# Patient Record
Sex: Female | Born: 1950 | Race: White | Hispanic: No | Marital: Married | Smoking: Never smoker
Health system: Southern US, Community
[De-identification: ages and names within clinical notes are randomized; demographics above are authoritative.]

## PROBLEM LIST (undated history)

## (undated) DIAGNOSIS — I447 Left bundle-branch block, unspecified: Secondary | ICD-10-CM

## (undated) DIAGNOSIS — Z8619 Personal history of other infectious and parasitic diseases: Secondary | ICD-10-CM

## (undated) DIAGNOSIS — E785 Hyperlipidemia, unspecified: Secondary | ICD-10-CM

## (undated) DIAGNOSIS — K219 Gastro-esophageal reflux disease without esophagitis: Secondary | ICD-10-CM

## (undated) DIAGNOSIS — Z8489 Family history of other specified conditions: Secondary | ICD-10-CM

## (undated) DIAGNOSIS — G709 Myoneural disorder, unspecified: Secondary | ICD-10-CM

## (undated) DIAGNOSIS — R112 Nausea with vomiting, unspecified: Secondary | ICD-10-CM

## (undated) DIAGNOSIS — Z9889 Other specified postprocedural states: Secondary | ICD-10-CM

## (undated) DIAGNOSIS — M1990 Unspecified osteoarthritis, unspecified site: Secondary | ICD-10-CM

## (undated) DIAGNOSIS — R011 Cardiac murmur, unspecified: Secondary | ICD-10-CM

## (undated) HISTORY — DX: Personal history of other infectious and parasitic diseases: Z86.19

## (undated) HISTORY — DX: Gastro-esophageal reflux disease without esophagitis: K21.9

## (undated) HISTORY — PX: JOINT REPLACEMENT: SHX530

## (undated) HISTORY — DX: Left bundle-branch block, unspecified: I44.7

## (undated) HISTORY — DX: Hyperlipidemia, unspecified: E78.5

## (undated) HISTORY — DX: Myoneural disorder, unspecified: G70.9

## (undated) HISTORY — PX: FRACTURE SURGERY: SHX138

## (undated) HISTORY — DX: Unspecified osteoarthritis, unspecified site: M19.90

## (undated) HISTORY — PX: OTHER SURGICAL HISTORY: SHX169

---

## 1988-01-20 HISTORY — PX: OTHER SURGICAL HISTORY: SHX169

## 2000-09-01 ENCOUNTER — Other Ambulatory Visit: Admission: RE | Admit: 2000-09-01 | Discharge: 2000-09-01 | Payer: Self-pay | Admitting: Obstetrics and Gynecology

## 2002-09-18 ENCOUNTER — Other Ambulatory Visit: Admission: RE | Admit: 2002-09-18 | Discharge: 2002-09-18 | Payer: Self-pay | Admitting: Obstetrics and Gynecology

## 2003-08-07 ENCOUNTER — Ambulatory Visit (HOSPITAL_COMMUNITY): Admission: RE | Admit: 2003-08-07 | Discharge: 2003-08-07 | Payer: Self-pay | Admitting: Cardiology

## 2010-03-14 ENCOUNTER — Ambulatory Visit (INDEPENDENT_AMBULATORY_CARE_PROVIDER_SITE_OTHER): Payer: BC Managed Care – PPO | Admitting: Cardiology

## 2010-03-14 DIAGNOSIS — I447 Left bundle-branch block, unspecified: Secondary | ICD-10-CM

## 2010-03-26 ENCOUNTER — Encounter: Payer: Self-pay | Admitting: Cardiology

## 2010-03-26 ENCOUNTER — Ambulatory Visit (HOSPITAL_COMMUNITY): Payer: BC Managed Care – PPO | Attending: Cardiology

## 2010-03-26 DIAGNOSIS — I059 Rheumatic mitral valve disease, unspecified: Secondary | ICD-10-CM | POA: Insufficient documentation

## 2010-03-26 DIAGNOSIS — I447 Left bundle-branch block, unspecified: Secondary | ICD-10-CM | POA: Insufficient documentation

## 2011-03-05 ENCOUNTER — Encounter: Payer: Self-pay | Admitting: *Deleted

## 2011-03-10 ENCOUNTER — Ambulatory Visit (INDEPENDENT_AMBULATORY_CARE_PROVIDER_SITE_OTHER): Payer: BC Managed Care – PPO | Admitting: Cardiology

## 2011-03-10 DIAGNOSIS — E7800 Pure hypercholesterolemia, unspecified: Secondary | ICD-10-CM

## 2011-03-10 DIAGNOSIS — I447 Left bundle-branch block, unspecified: Secondary | ICD-10-CM

## 2011-03-10 DIAGNOSIS — E785 Hyperlipidemia, unspecified: Secondary | ICD-10-CM

## 2011-03-10 NOTE — Patient Instructions (Addendum)
Dr Shirlee Latch has recommended that you take aspirin 81 mg daily.  Your physician recommends that you return for a FASTING lipid profile /liver profile/TSH/CBC/BMET   Dr Shirlee Latch has recommended you see Dr Felicity Coyer for your primary care.   Your physician wants you to follow-up in: 1 year with Dr Shirlee Latch. (February 2014). You will receive a reminder letter in the mail two months in advance. If you don't receive a letter, please call our office to schedule the follow-up appointment.

## 2011-03-11 ENCOUNTER — Encounter: Payer: Self-pay | Admitting: Cardiology

## 2011-03-11 DIAGNOSIS — I447 Left bundle-branch block, unspecified: Secondary | ICD-10-CM | POA: Insufficient documentation

## 2011-03-11 DIAGNOSIS — E785 Hyperlipidemia, unspecified: Secondary | ICD-10-CM | POA: Insufficient documentation

## 2011-03-11 NOTE — Assessment & Plan Note (Signed)
Chronic LBBB.  Negative myoview about 10 years ago when this was first noted.  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  No symptoms suggesting episodes of higher degree heart block.  Would have her take ASA 81 mg daily given somewhat elevated cardiac risk from the presence of LBBB.

## 2011-03-11 NOTE — Assessment & Plan Note (Signed)
She has not taken atorvastatin regularly.  Will check lipids.

## 2011-03-11 NOTE — Progress Notes (Signed)
61 yo with history of chronic LBBB presents for cardiology followup.  She has been seen by Dr. Deborah Chalk in the past and is seen by me for the first time today.  She had a stress myoview done about 10 years ago that was normal.  Echo in 3/12 showed dyssynchrony but EF was normal.  She has been doing well symptomatically.  No exertional dyspnea though she does not get a lot of regular exercise.  No chest pain.  No history of lightheadedness or syncope.  She does not have a PCP and has not had lipids, etc, checked recently.    ECG: NSR, LBBB  PMH: 1. Chronic LBBB: Had myoview around 2003 that was normal.  Echo (3/12) with septal-lateral dyssynchrony, EF 55%, mild MR.  2. Hyperlpidemia 3. H/o scarlet fever at age 8.  4. Syncope with blood draws.  SH:  Married, Technical sales engineer, nonsmoker, lives in [place].   FH:  Grandmother with MI.   ROS: All systems reviewed and negative except as per HPI.   Current Outpatient Prescriptions  Medication Sig Dispense Refill  . atorvastatin (LIPITOR) 10 MG tablet Take 10 mg by mouth Daily.      Marland Kitchen aspirin (ASPIRIN CHILDRENS) 81 MG chewable tablet Take one tablet daily        BP 132/86  Pulse 56  Ht 5\' 5"  (1.651 m)  Wt 136 lb 12.8 oz (62.052 kg)  BMI 22.76 kg/m2 General: NAD Neck: No JVD, no thyromegaly or thyroid nodule.  Lungs: Clear to auscultation bilaterally with normal respiratory effort. CV: Nondisplaced PMI.  Heart regular S1/S2, S2 is paradoxically split, no S3/S4, no murmur.  No peripheral edema.  No carotid bruit.  Normal pedal pulses.  Abdomen: Soft, nontender, no hepatosplenomegaly, no distention.  Neurologic: Alert and oriented x 3.  Psych: Normal affect. Extremities: No clubbing or cyanosis.

## 2011-03-12 ENCOUNTER — Other Ambulatory Visit: Payer: BC Managed Care – PPO

## 2011-03-26 ENCOUNTER — Other Ambulatory Visit (INDEPENDENT_AMBULATORY_CARE_PROVIDER_SITE_OTHER): Payer: BC Managed Care – PPO

## 2011-03-26 DIAGNOSIS — I447 Left bundle-branch block, unspecified: Secondary | ICD-10-CM

## 2011-03-26 DIAGNOSIS — E7800 Pure hypercholesterolemia, unspecified: Secondary | ICD-10-CM

## 2011-03-26 LAB — CBC WITH DIFFERENTIAL/PLATELET
Basophils Absolute: 0 10*3/uL (ref 0.0–0.1)
Eosinophils Relative: 1.5 % (ref 0.0–5.0)
HCT: 42.3 % (ref 36.0–46.0)
Hemoglobin: 14.3 g/dL (ref 12.0–15.0)
Lymphs Abs: 2.1 10*3/uL (ref 0.7–4.0)
MCV: 93.1 fl (ref 78.0–100.0)
Monocytes Absolute: 0.5 10*3/uL (ref 0.1–1.0)
Monocytes Relative: 8.8 % (ref 3.0–12.0)
Neutro Abs: 3.1 10*3/uL (ref 1.4–7.7)
Platelets: 227 10*3/uL (ref 150.0–400.0)
RDW: 12.8 % (ref 11.5–14.6)

## 2011-03-26 LAB — HEPATIC FUNCTION PANEL
AST: 24 U/L (ref 0–37)
Albumin: 4.7 g/dL (ref 3.5–5.2)
Alkaline Phosphatase: 70 U/L (ref 39–117)
Total Bilirubin: 0.7 mg/dL (ref 0.3–1.2)

## 2011-03-26 LAB — TSH: TSH: 1.48 u[IU]/mL (ref 0.35–5.50)

## 2011-03-26 LAB — BASIC METABOLIC PANEL
Calcium: 9.6 mg/dL (ref 8.4–10.5)
GFR: 75.32 mL/min (ref >60.00)
Potassium: 4.3 mEq/L (ref 3.5–5.1)
Sodium: 141 mEq/L (ref 135–145)

## 2011-03-26 LAB — LIPID PANEL
HDL: 53.1 mg/dL (ref >39.00)
Triglycerides: 253 mg/dL — ABNORMAL HIGH (ref 0.0–149.0)

## 2011-03-27 ENCOUNTER — Other Ambulatory Visit: Payer: Self-pay | Admitting: Cardiology

## 2011-03-30 ENCOUNTER — Telehealth: Payer: Self-pay | Admitting: Cardiology

## 2011-03-30 ENCOUNTER — Other Ambulatory Visit: Payer: Self-pay

## 2011-03-30 MED ORDER — ATORVASTATIN CALCIUM 10 MG PO TABS: 10.0000 mg | ORAL_TABLET | Freq: Every day | ORAL | Status: DC

## 2011-03-30 NOTE — Telephone Encounter (Signed)
Out of pills 

## 2011-04-06 NOTE — Telephone Encounter (Signed)
She was notified that her lipitor was refilled on 03/30/11 at [facility] on [address].  She was given lab results and states she has been taking her lipitor daily and watching her diet closely.

## 2011-04-06 NOTE — Telephone Encounter (Signed)
Pt rtn call from last week and she needs her refill for lipitor and she also would like her results of her labs she got done prior to her going out of town.

## 2011-04-13 ENCOUNTER — Other Ambulatory Visit: Payer: Self-pay | Admitting: Cardiology

## 2011-04-13 MED ORDER — ATORVASTATIN CALCIUM 40 MG PO TABS: 40.0000 mg | ORAL_TABLET | Freq: Every day | ORAL | Status: DC

## 2011-04-13 NOTE — Telephone Encounter (Signed)
New Msg: Pt needs refill of Lipitor 40 mg called into [facility] on [address]. Pt RX dosage was increased from 10 mg to 40mg . Therefore pt needs new RX called in.

## 2011-04-13 NOTE — Telephone Encounter (Signed)
Refilled lipitor 40mg  to pharmacy

## 2011-07-29 ENCOUNTER — Ambulatory Visit (INDEPENDENT_AMBULATORY_CARE_PROVIDER_SITE_OTHER): Payer: BC Managed Care – PPO | Admitting: Internal Medicine

## 2011-07-29 ENCOUNTER — Encounter: Payer: Self-pay | Admitting: Internal Medicine

## 2011-07-29 VITALS — BP 132/82 | HR 69 | Temp 97.4°F | Ht 65.0 in | Wt 132.4 lb

## 2011-07-29 DIAGNOSIS — Z1239 Encounter for other screening for malignant neoplasm of breast: Secondary | ICD-10-CM

## 2011-07-29 DIAGNOSIS — Z23 Encounter for immunization: Secondary | ICD-10-CM

## 2011-07-29 DIAGNOSIS — Z1211 Encounter for screening for malignant neoplasm of colon: Secondary | ICD-10-CM

## 2011-07-29 DIAGNOSIS — E785 Hyperlipidemia, unspecified: Secondary | ICD-10-CM

## 2011-07-29 DIAGNOSIS — Z780 Asymptomatic menopausal state: Secondary | ICD-10-CM

## 2011-07-29 DIAGNOSIS — Z0000 Encounter for general adult medical examination without abnormal findings: Secondary | ICD-10-CM

## 2011-07-29 DIAGNOSIS — M19049 Primary osteoarthritis, unspecified hand: Secondary | ICD-10-CM | POA: Insufficient documentation

## 2011-07-29 DIAGNOSIS — Z2911 Encounter for prophylactic immunotherapy for respiratory syncytial virus (RSV): Secondary | ICD-10-CM

## 2011-07-29 NOTE — Assessment & Plan Note (Signed)
B DIP/PIP affected with enlargement Pain controlled with prn OTC ibuprofen

## 2011-07-29 NOTE — Progress Notes (Signed)
Subjective:    Patient ID: Dawn Perry, female    DOB: May 09, 1950, 61 y.o.   MRN: [id number]  HPI New pt to me and our division, here to establish care with PCP Also patient is here today for annual physical. Patient feels well.  Past Medical History  Diagnosis Date  . LBBB (left bundle branch block)   . H/O scarlet fever     age 5  . Hyperlipidemia 03/11/2011  . Arthritis   . GERD (gastroesophageal reflux disease)   . History of chicken pox    Family History  Problem Relation Age of Onset  . Heart attack Father 37  . Arthritis Father   . Hyperlipidemia Mother   . Other Mother     abnormal EKG's  . Arthritis Mother   . Parkinsonism Other    History  Substance Use Topics  . Smoking status: Never Smoker   . Smokeless tobacco: Never Used  . Alcohol Use: Yes     occasionally a glass of wine     Review of Systems Constitutional: Negative for fever or weight change.  Respiratory: Negative for cough and shortness of breath.   Cardiovascular: Negative for chest pain or palpitations.  Gastrointestinal: Negative for abdominal pain, no bowel changes.  Musculoskeletal: Negative for gait problem or joint swelling.  Skin: Negative for rash.  Neurological: Negative for dizziness or headache.  No other specific complaints in a complete review of systems (except as listed in HPI above).     Objective:   Physical Exam BP 132/82  Pulse 69  Temp 97.4 F (36.3 C) (Oral)  Ht 5\' 5"  (1.651 m)  Wt 132 lb 6.4 oz (60.056 kg)  BMI 22.03 kg/m2  SpO2 97% Wt Readings from Last 3 Encounters:  07/29/11 132 lb 6.4 oz (60.056 kg)  03/10/11 136 lb 12.8 oz (62.052 kg)   Constitutional: She appears fit, well-developed and well-nourished. No distress.  HENT: Head: Normocephalic and atraumatic. Ears: B TMs ok, no erythema or effusion; Nose: Nose normal. Mouth/Throat: Oropharynx is clear and moist. No oropharyngeal exudate.  Eyes: Conjunctivae and EOM are normal. Pupils are equal, round,  and reactive to light. No scleral icterus.  Neck: Normal range of motion. Neck supple. No JVD or LAD present. No thyromegaly present.  Cardiovascular: Normal rate, regular rhythm and normal heart sounds.  No murmur heard. No BLE edema. Pulmonary/Chest: Effort normal and breath sounds normal. No respiratory distress. She has no wheezes.  Abdominal: Soft. Bowel sounds are normal. She exhibits no distension. There is no tenderness. no masses Musculoskeletal: B hands with PIP/DIP bony enlargement - no active synovitis or abnormal warmth -slight ulnar deviation of 5th digits, well healed L forearm scar from prior surgery  - other wise joints wth normal range of motion, no joint effusions. No gross deformities Neurological: She is alert and oriented to person, place, and time. No cranial nerve deficit. Coordination normal.  Skin: Skin is tan, warm and dry. No rash noted. No erythema.  Psychiatric: She has a normal mood and affect. Her behavior is normal. Judgment and thought content normal.   Lab Results  Component Value Date   WBC 5.8 03/26/2011   HGB 14.3 03/26/2011   HCT 42.3 03/26/2011   PLT 227.0 03/26/2011   GLUCOSE 94 03/26/2011   CHOL 233* 03/26/2011   TRIG 253.0* 03/26/2011   HDL 53.10 03/26/2011   LDLDIRECT 154.2 03/26/2011   ALT 30 03/26/2011   AST 24 03/26/2011   NA 141 03/26/2011  K 4.3 03/26/2011   CL 107 03/26/2011   CREATININE 0.8 03/26/2011   BUN 16 03/26/2011   CO2 25 03/26/2011   TSH 1.48 03/26/2011   ECG 03/10/11 - LBBB (chronic), sinus @ 56 bpm    Assessment & Plan:  CPX/v70.0 - Patient has been counseled on age-appropriate routine health concerns for screening and prevention. These are reviewed and up-to-date. Immunizations are up-to-date or declined. Labs 03/2011 and last ECG reviewed.

## 2011-07-29 NOTE — Patient Instructions (Signed)
It was good to see you today. We have reviewed your prior records including labs and tests today we'll make referral to [facility] (former [place]) for bone density and mammogram, gastroenterology for colon screening. Our office will contact you regarding appointment(s) once made. Arrange follow up with your gynecologist for PAP/pelvic every 2-3 years Health Maintenance reviewed - referrals as discussed above, tetanus booster (Tdap) updated - Shingles vaccine as discussed - all other recommended immunizations and age-appropriate screenings are up-to-date. Medications reviewed, no changes at this time. Get 1200mg /day calcium and 1000-2000Units/day vitamin D in your diet Please schedule followup in 12 months for annual medical physical and labs; call sooner if problems.  Preventive Care for Adults, Female A healthy lifestyle and preventive care can promote health and wellness. Preventive health guidelines for women include the following key practices.  A routine yearly physical is a good way to check with your caregiver about your health and preventive screening. It is a chance to share any concerns and updates on your health, and to receive a thorough exam.   Visit your dentist for a routine exam and preventive care every 6 months. Brush your teeth twice a day and floss once a day. Good oral hygiene prevents tooth decay and gum disease.   The frequency of eye exams is based on your age, health, family medical history, use of contact lenses, and other factors. Follow your caregiver's recommendations for frequency of eye exams.   Eat a healthy diet. Foods like vegetables, fruits, whole grains, low-fat dairy products, and lean protein foods contain the nutrients you need without too many calories. Decrease your intake of foods high in solid fats, added sugars, and salt. Eat the right amount of calories for you. Get information about a proper diet from your caregiver, if necessary.   Regular physical  exercise is one of the most important things you can do for your health. Most adults should get at least 150 minutes of moderate-intensity exercise (any activity that increases your heart rate and causes you to sweat) each week. In addition, most adults need muscle-strengthening exercises on 2 or more days a week.   Maintain a healthy weight. The body mass index (BMI) is a screening tool to identify possible weight problems. It provides an estimate of body fat based on height and weight. Your caregiver can help determine your BMI, and can help you achieve or maintain a healthy weight. For adults 20 years and older:   A BMI below 18.5 is considered underweight.   A BMI of 18.5 to 24.9 is normal.   A BMI of 25 to 29.9 is considered overweight.   A BMI of 30 and above is considered obese.   Maintain normal blood lipids and cholesterol levels by exercising and minimizing your intake of saturated fat. Eat a balanced diet with plenty of fruit and vegetables. Blood tests for lipids and cholesterol should begin at age 54 and be repeated every 5 years. If your lipid or cholesterol levels are high, you are over 50, or you are at high risk for heart disease, you may need your cholesterol levels checked more frequently. Ongoing high lipid and cholesterol levels should be treated with medicines if diet and exercise are not effective.   If you smoke, find out from your caregiver how to quit. If you do not use tobacco, do not start.   If you are pregnant, do not drink alcohol. If you are breastfeeding, be very cautious about drinking alcohol. If you are not  pregnant and choose to drink alcohol, do not exceed 1 drink per day. One drink is considered to be 12 ounces (355 mL) of beer, 5 ounces (148 mL) of wine, or 1.5 ounces (44 mL) of liquor.   Avoid use of street drugs. Do not share needles with anyone. Ask for help if you need support or instructions about stopping the use of drugs.   High blood pressure causes  heart disease and increases the risk of stroke. Your blood pressure should be checked at least every 1 to 2 years. Ongoing high blood pressure should be treated with medicines if weight loss and exercise are not effective.   If you are 71 to 61 years old, ask your caregiver if you should take aspirin to prevent strokes.   Diabetes screening involves taking a blood sample to check your fasting blood sugar level. This should be done once every 3 years, after age 81, if you are within normal weight and without risk factors for diabetes. Testing should be considered at a younger age or be carried out more frequently if you are overweight and have at least 1 risk factor for diabetes.   Breast cancer screening is essential preventive care for women. You should practice "breast self-awareness." This means understanding the normal appearance and feel of your breasts and may include breast self-examination. Any changes detected, no matter how small, should be reported to a caregiver. Women in their 68s and 30s should have a clinical breast exam (CBE) by a caregiver as part of a regular health exam every 1 to 3 years. After age 34, women should have a CBE every year. Starting at age 68, women should consider having a mammography (breast X-ray test) every year. Women who have a family history of breast cancer should talk to their caregiver about genetic screening. Women at a high risk of breast cancer should talk to their caregivers about having magnetic resonance imaging (MRI) and a mammography every year.   The Pap test is a screening test for cervical cancer. A Pap test can show cell changes on the cervix that might become cervical cancer if left untreated. A Pap test is a procedure in which cells are obtained and examined from the lower end of the uterus (cervix).   Women should have a Pap test starting at age 55.   Between ages 50 and 68, Pap tests should be repeated every 2 years.   Beginning at age 20, you  should have a Pap test every 3 years as long as the past 3 Pap tests have been normal.   Some women have medical problems that increase the chance of getting cervical cancer. Talk to your caregiver about these problems. It is especially important to talk to your caregiver if a new problem develops soon after your last Pap test. In these cases, your caregiver may recommend more frequent screening and Pap tests.   The above recommendations are the same for women who have or have not gotten the vaccine for human papillomavirus (HPV).   If you had a hysterectomy for a problem that was not cancer or a condition that could lead to cancer, then you no longer need Pap tests. Even if you no longer need a Pap test, a regular exam is a good idea to make sure no other problems are starting.   If you are between ages 15 and 73, and you have had normal Pap tests going back 10 years, you no longer need Pap tests. Even  if you no longer need a Pap test, a regular exam is a good idea to make sure no other problems are starting.   If you have had past treatment for cervical cancer or a condition that could lead to cancer, you need Pap tests and screening for cancer for at least 20 years after your treatment.   If Pap tests have been discontinued, risk factors (such as a new sexual partner) need to be reassessed to determine if screening should be resumed.   The HPV test is an additional test that may be used for cervical cancer screening. The HPV test looks for the virus that can cause the cell changes on the cervix. The cells collected during the Pap test can be tested for HPV. The HPV test could be used to screen women aged 69 years and older, and should be used in women of any age who have unclear Pap test results. After the age of 51, women should have HPV testing at the same frequency as a Pap test.   Colorectal cancer can be detected and often prevented. Most routine colorectal cancer screening begins at the age  of 52 and continues through age 38. However, your caregiver may recommend screening at an earlier age if you have risk factors for colon cancer. On a yearly basis, your caregiver may provide home test kits to check for hidden blood in the stool. Use of a small camera at the end of a tube, to directly examine the colon (sigmoidoscopy or colonoscopy), can detect the earliest forms of colorectal cancer. Talk to your caregiver about this at age 12, when routine screening begins.  Direct examination of the colon should be repeated every 5 to 10 years through age 28, unless early forms of pre-cancerous polyps or small growths are found.   Hepatitis C blood testing is recommended for all people born from [birth year] through 1965 and any individual with known risks for hepatitis C.   Practice safe sex. Use condoms and avoid high-risk sexual practices to reduce the spread of sexually transmitted infections (STIs). STIs include gonorrhea, chlamydia, syphilis, trichomonas, herpes, HPV, and human immunodeficiency virus (HIV). Herpes, HIV, and HPV are viral illnesses that have no cure. They can result in disability, cancer, and death. Sexually active women aged 62 and younger should be checked for chlamydia. Older women with new or multiple partners should also be tested for chlamydia. Testing for other STIs is recommended if you are sexually active and at increased risk.   Osteoporosis is a disease in which the bones lose minerals and strength with aging. This can result in serious bone fractures. The risk of osteoporosis can be identified using a bone density scan. Women ages 6 and over and women at risk for fractures or osteoporosis should discuss screening with their caregivers. Ask your caregiver whether you should take a calcium supplement or vitamin D to reduce the rate of osteoporosis.   Menopause can be associated with physical symptoms and risks. Hormone replacement therapy is available to decrease symptoms and  risks. You should talk to your caregiver about whether hormone replacement therapy is right for you.   Use sunscreen with sun protection factor (SPF) of 30 or more. Apply sunscreen liberally and repeatedly throughout the day. You should seek shade when your shadow is shorter than you. Protect yourself by wearing long sleeves, pants, a wide-brimmed hat, and sunglasses year round, whenever you are outdoors.   Once a month, do a whole body skin exam, using a  mirror to look at the skin on your back. Notify your caregiver of new moles, moles that have irregular borders, moles that are larger than a pencil eraser, or moles that have changed in shape or color.   Stay current with required immunizations.   Influenza. You need a dose every fall (or winter). The composition of the flu vaccine changes each year, so being vaccinated once is not enough.   Pneumococcal polysaccharide. You need 1 to 2 doses if you smoke cigarettes or if you have certain chronic medical conditions. You need 1 dose at age 1 (or older) if you have never been vaccinated.   Tetanus, diphtheria, pertussis (Tdap, Td). Get 1 dose of Tdap vaccine if you are younger than age 77, are over 54 and have contact with an infant, are a Research scientist (physical sciences), are pregnant, or simply want to be protected from whooping cough. After that, you need a Td booster dose every 10 years. Consult your caregiver if you have not had at least 3 tetanus and diphtheria-containing shots sometime in your life or have a deep or dirty wound.   HPV. You need this vaccine if you are a woman age 13 or younger. The vaccine is given in 3 doses over 6 months.   Measles, mumps, rubella (MMR). You need at least 1 dose of MMR if you were born in 1957 or later. You may also need a second dose.   Meningococcal. If you are age 56 to 16 and a first-year college student living in a residence hall, or have one of several medical conditions, you need to get vaccinated against  meningococcal disease. You may also need additional booster doses.   Zoster (shingles). If you are age 23 or older, you should get this vaccine.   Varicella (chickenpox). If you have never had chickenpox or you were vaccinated but received only 1 dose, talk to your caregiver to find out if you need this vaccine.   Hepatitis A. You need this vaccine if you have a specific risk factor for hepatitis A virus infection or you simply wish to be protected from this disease. The vaccine is usually given as 2 doses, 6 to 18 months apart.   Hepatitis B. You need this vaccine if you have a specific risk factor for hepatitis B virus infection or you simply wish to be protected from this disease. The vaccine is given in 3 doses, usually over 6 months.  Preventive Services / Frequency Ages 46 to 17  Blood pressure check.** / Every 1 to 2 years.   Lipid and cholesterol check.** / Every 5 years beginning at age 3.   Clinical breast exam.** / Every 3 years for women in their 71s and 30s.   Pap test.** / Every 2 years from ages 54 through 71. Every 3 years starting at age 61 through age 31 or 71 with a history of 3 consecutive normal Pap tests.   HPV screening.** / Every 3 years from ages 64 through ages 54 to 74 with a history of 3 consecutive normal Pap tests.   Hepatitis C blood test.** / For any individual with known risks for hepatitis C.   Skin self-exam. / Monthly.   Influenza immunization.** / Every year.   Pneumococcal polysaccharide immunization.** / 1 to 2 doses if you smoke cigarettes or if you have certain chronic medical conditions.   Tetanus, diphtheria, pertussis (Tdap, Td) immunization. / A one-time dose of Tdap vaccine. After that, you need a Td booster dose  every 10 years.   HPV immunization. / 3 doses over 6 months, if you are 27 and younger.   Measles, mumps, rubella (MMR) immunization. / You need at least 1 dose of MMR if you were born in 1957 or later. You may also need a  second dose.   Meningococcal immunization. / 1 dose if you are age 57 to 51 and a first-year college student living in a residence hall, or have one of several medical conditions, you need to get vaccinated against meningococcal disease. You may also need additional booster doses.   Varicella immunization.** / Consult your caregiver.   Hepatitis A immunization.** / Consult your caregiver. 2 doses, 6 to 18 months apart.   Hepatitis B immunization.** / Consult your caregiver. 3 doses usually over 6 months.  Ages 70 to 32  Blood pressure check.** / Every 1 to 2 years.   Lipid and cholesterol check.** / Every 5 years beginning at age 42.   Clinical breast exam.** / Every year after age 39.   Mammogram.** / Every year beginning at age 53 and continuing for as long as you are in good health. Consult with your caregiver.   Pap test.** / Every 3 years starting at age 9 through age 40 or 33 with a history of 3 consecutive normal Pap tests.   HPV screening.** / Every 3 years from ages 16 through ages 10 to 38 with a history of 3 consecutive normal Pap tests.   Fecal occult blood test (FOBT) of stool. / Every year beginning at age 31 and continuing until age 1. You may not need to do this test if you get a colonoscopy every 10 years.   Flexible sigmoidoscopy or colonoscopy.** / Every 5 years for a flexible sigmoidoscopy or every 10 years for a colonoscopy beginning at age 33 and continuing until age 76.   Hepatitis C blood test.** / For all people born from [birth year] through 1965 and any individual with known risks for hepatitis C.   Skin self-exam. / Monthly.   Influenza immunization.** / Every year.   Pneumococcal polysaccharide immunization.** / 1 to 2 doses if you smoke cigarettes or if you have certain chronic medical conditions.   Tetanus, diphtheria, pertussis (Tdap, Td) immunization.** / A one-time dose of Tdap vaccine. After that, you need a Td booster dose every 10 years.   Measles,  mumps, rubella (MMR) immunization. / You need at least 1 dose of MMR if you were born in 1957 or later. You may also need a second dose.   Varicella immunization.** / Consult your caregiver.   Meningococcal immunization.** / Consult your caregiver.   Hepatitis A immunization.** / Consult your caregiver. 2 doses, 6 to 18 months apart.   Hepatitis B immunization.** / Consult your caregiver. 3 doses, usually over 6 months.  Ages 24 and over  Blood pressure check.** / Every 1 to 2 years.   Lipid and cholesterol check.** / Every 5 years beginning at age 51.   Clinical breast exam.** / Every year after age 62.   Mammogram.** / Every year beginning at age 43 and continuing for as long as you are in good health. Consult with your caregiver.   Pap test.** / Every 3 years starting at age 17 through age 34 or 44 with a 3 consecutive normal Pap tests. Testing can be stopped between 65 and 70 with 3 consecutive normal Pap tests and no abnormal Pap or HPV tests in the past 10 years.  HPV screening.** / Every 3 years from ages 41 through ages 65 or 52 with a history of 3 consecutive normal Pap tests. Testing can be stopped between 65 and 70 with 3 consecutive normal Pap tests and no abnormal Pap or HPV tests in the past 10 years.   Fecal occult blood test (FOBT) of stool. / Every year beginning at age 49 and continuing until age 22. You may not need to do this test if you get a colonoscopy every 10 years.   Flexible sigmoidoscopy or colonoscopy.** / Every 5 years for a flexible sigmoidoscopy or every 10 years for a colonoscopy beginning at age 92 and continuing until age 77.   Hepatitis C blood test.** / For all people born from [birth year] through 1965 and any individual with known risks for hepatitis C.   Osteoporosis screening.** / A one-time screening for women ages 44 and over and women at risk for fractures or osteoporosis.   Skin self-exam. / Monthly.   Influenza immunization.** / Every year.    Pneumococcal polysaccharide immunization.** / 1 dose at age 8 (or older) if you have never been vaccinated.   Tetanus, diphtheria, pertussis (Tdap, Td) immunization. / A one-time dose of Tdap vaccine if you are over 65 and have contact with an infant, are a Research scientist (physical sciences), or simply want to be protected from whooping cough. After that, you need a Td booster dose every 10 years.   Varicella immunization.** / Consult your caregiver.   Meningococcal immunization.** / Consult your caregiver.   Hepatitis A immunization.** / Consult your caregiver. 2 doses, 6 to 18 months apart.   Hepatitis B immunization.** / Check with your caregiver. 3 doses, usually over 6 months.  ** Family history and personal history of risk and conditions may change your caregiver's recommendations. Document Released: 03/03/2001 Document Revised: 12/25/2010 Document Reviewed: 06/02/2010 [facility] Patient Information 2012 [place], [place].

## 2011-07-29 NOTE — Assessment & Plan Note (Signed)
Has regularly taken low dose atorva for same since 03/2011 - declines recheck lipids today The current medical regimen is effective;  continue present plan and medications.

## 2011-09-07 ENCOUNTER — Telehealth: Payer: Self-pay | Admitting: Cardiology

## 2011-09-07 DIAGNOSIS — E785 Hyperlipidemia, unspecified: Secondary | ICD-10-CM

## 2011-09-07 NOTE — Telephone Encounter (Signed)
New Problem:    Patient would like to have some blood work drawn so she could renew her prescriptions.  Please call back.

## 2011-09-07 NOTE — Telephone Encounter (Signed)
LMTCB

## 2011-09-14 ENCOUNTER — Telehealth: Payer: Self-pay | Admitting: Internal Medicine

## 2011-09-14 NOTE — Telephone Encounter (Signed)
Please let pt know her [facility] DEXA 09/08/11 shows normal bone density - no tx changes recommended - thanks

## 2011-09-14 NOTE — Telephone Encounter (Signed)
Called pt no answer LMOM md response.../LMB 

## 2011-09-17 NOTE — Telephone Encounter (Signed)
Pt calling to schedule fasting lipid liver profile before she goes [place] of the [place]12[place]30. Lab scheduled for 09/29/11. Pt states she has enough atorvastatin to last until she gets back 10/19/11.

## 2011-09-29 ENCOUNTER — Other Ambulatory Visit: Payer: BC Managed Care – PPO

## 2011-10-05 ENCOUNTER — Encounter: Payer: Self-pay | Admitting: Internal Medicine

## 2011-11-03 ENCOUNTER — Other Ambulatory Visit (INDEPENDENT_AMBULATORY_CARE_PROVIDER_SITE_OTHER): Payer: BC Managed Care – PPO

## 2011-11-03 DIAGNOSIS — E785 Hyperlipidemia, unspecified: Secondary | ICD-10-CM

## 2011-11-03 LAB — HEPATIC FUNCTION PANEL
ALT: 23 U/L (ref 0–35)
AST: 21 U/L (ref 0–37)
Albumin: 4 g/dL (ref 3.5–5.2)
Total Bilirubin: 0.6 mg/dL (ref 0.3–1.2)

## 2011-11-03 LAB — LIPID PANEL
HDL: 44.8 mg/dL (ref >39.00)
Triglycerides: 121 mg/dL (ref 0.0–149.0)
VLDL: 24.2 mg/dL (ref 0.0–40.0)

## 2011-11-04 ENCOUNTER — Other Ambulatory Visit: Payer: Self-pay | Admitting: *Deleted

## 2011-11-04 MED ORDER — ATORVASTATIN CALCIUM 40 MG PO TABS: 40.0000 mg | ORAL_TABLET | Freq: Every day | ORAL | Status: DC

## 2011-11-17 ENCOUNTER — Other Ambulatory Visit: Payer: Self-pay | Admitting: Cardiology

## 2012-06-15 ENCOUNTER — Encounter: Payer: Self-pay | Admitting: Cardiology

## 2012-06-16 ENCOUNTER — Encounter: Payer: Self-pay | Admitting: Cardiology

## 2012-08-03 ENCOUNTER — Encounter: Payer: BC Managed Care – PPO | Admitting: Internal Medicine

## 2012-11-01 ENCOUNTER — Ambulatory Visit (INDEPENDENT_AMBULATORY_CARE_PROVIDER_SITE_OTHER): Payer: BC Managed Care – PPO

## 2012-11-01 DIAGNOSIS — Z23 Encounter for immunization: Secondary | ICD-10-CM

## 2012-12-18 ENCOUNTER — Other Ambulatory Visit: Payer: Self-pay | Admitting: Cardiology

## 2013-01-29 ENCOUNTER — Other Ambulatory Visit: Payer: Self-pay | Admitting: Cardiology

## 2013-02-02 ENCOUNTER — Other Ambulatory Visit: Payer: Self-pay | Admitting: Cardiology

## 2013-02-09 ENCOUNTER — Other Ambulatory Visit: Payer: Self-pay

## 2013-02-09 MED ORDER — ATORVASTATIN CALCIUM 40 MG PO TABS: ORAL_TABLET | ORAL | Status: DC

## 2013-02-10 ENCOUNTER — Encounter: Payer: Self-pay | Admitting: Cardiology

## 2013-02-15 ENCOUNTER — Encounter: Payer: Self-pay | Admitting: Nurse Practitioner

## 2013-02-15 ENCOUNTER — Ambulatory Visit (INDEPENDENT_AMBULATORY_CARE_PROVIDER_SITE_OTHER): Payer: BC Managed Care – PPO | Admitting: Nurse Practitioner

## 2013-02-15 VITALS — BP 120/80 | HR 68 | Ht 65.0 in | Wt 142.4 lb

## 2013-02-15 DIAGNOSIS — I447 Left bundle-branch block, unspecified: Secondary | ICD-10-CM

## 2013-02-15 DIAGNOSIS — E785 Hyperlipidemia, unspecified: Secondary | ICD-10-CM

## 2013-02-15 LAB — BASIC METABOLIC PANEL
BUN: 14 mg/dL (ref 6–23)
CO2: 25 mEq/L (ref 19–32)
Calcium: 9.4 mg/dL (ref 8.4–10.5)
Chloride: 107 mEq/L (ref 96–112)
Creatinine, Ser: 0.7 mg/dL (ref 0.4–1.2)
GFR: 85.6 mL/min (ref >60.00)
Glucose, Bld: 92 mg/dL (ref 70–99)
Potassium: 4.3 mEq/L (ref 3.5–5.1)
Sodium: 138 mEq/L (ref 135–145)

## 2013-02-15 LAB — HEPATIC FUNCTION PANEL
ALT: 25 U/L (ref 0–35)
AST: 22 U/L (ref 0–37)
Albumin: 4.3 g/dL (ref 3.5–5.2)
Alkaline Phosphatase: 78 U/L (ref 39–117)
Bilirubin, Direct: 0.1 mg/dL (ref 0.0–0.3)
Total Bilirubin: 1 mg/dL (ref 0.3–1.2)
Total Protein: 7.3 g/dL (ref 6.0–8.3)

## 2013-02-15 LAB — LIPID PANEL
Cholesterol: 207 mg/dL — ABNORMAL HIGH (ref 0–200)
HDL: 42.4 mg/dL (ref >39.00)
Total CHOL/HDL Ratio: 5
Triglycerides: 253 mg/dL — ABNORMAL HIGH (ref 0.0–149.0)
VLDL: 50.6 mg/dL — ABNORMAL HIGH (ref 0.0–40.0)

## 2013-02-15 LAB — CBC
HCT: 41.2 % (ref 36.0–46.0)
Hemoglobin: 13.7 g/dL (ref 12.0–15.0)
MCHC: 33.2 g/dL (ref 30.0–36.0)
MCV: 92.8 fl (ref 78.0–100.0)
Platelets: 255 10*3/uL (ref 150.0–400.0)
RBC: 4.44 Mil/uL (ref 3.87–5.11)
RDW: 12.4 % (ref 11.5–14.6)
WBC: 4.7 10*3/uL (ref 4.5–10.5)

## 2013-02-15 LAB — LDL CHOLESTEROL, DIRECT: Direct LDL: 132 mg/dL

## 2013-02-15 MED ORDER — ATORVASTATIN CALCIUM 40 MG PO TABS: 40.0000 mg | ORAL_TABLET | ORAL | Status: DC | PRN

## 2013-02-15 NOTE — Patient Instructions (Addendum)
Stay on your current medicine  We will check labs today  We will see you back in 2 years  Call [facility] office at [phone] if you have any questions, problems or concerns.

## 2013-02-15 NOTE — Progress Notes (Signed)
Jamse MeadJanine S Smoots Date of Birth: 1950-10-10 Medical Record #[id number]  History of Present Illness: Ms. Radene KneeChadwick is seen back today for a follow up visit. Seen for Dr. Shirlee LatchMcLean. Previously seen by Dr. Deborah Chalkennant. Remote negative Myoview in 2004 when her LBBB was noted. She has LBBB on EKG with echo from 2002 showing septal lateral dyssynchrony with a normal EF and mild MR, HLD, and remote scarlet fever.   Last seen here 2 years ago - was doing ok. Baby aspirin was advised. His note mentions an echo from 03/2010 (which I cannot locate in EPIC) with EF 55% and mild MR.   Comes back today. Here alone. Did not get her recall letter for last year - now needs medicine refilled. Doing ok. No symptoms. No chest pain. Not short of breath. No passing out spells. No dizziness. Remains active. Tolerating her medicines - no recent labs. Had just coffee and toast this morning. Reminds me that she does get vagal with blood draws.   Current Outpatient Prescriptions  Medication Sig Dispense Refill  . atorvastatin (LIPITOR) 40 MG tablet Take 40 mg by mouth as needed (take medication when you remember to). TAKE 1 TABLET EVERY DAY       No current facility-administered medications for this visit.    No Known Allergies  Past Medical History  Diagnosis Date  . LBBB (left bundle branch block)   . H/O scarlet fever     age 63  . Hyperlipidemia   . Osteoarthritis   . GERD (gastroesophageal reflux disease)     B hands  . History of chicken pox     Past Surgical History  Procedure Laterality Date  . Right shoulder tear  1990  . Broke left forearm      History  Smoking status  . Never Smoker   Smokeless tobacco  . Never Used    History  Alcohol Use  . Yes    Comment: occasionally a glass of wine    Family History  Problem Relation Age of Onset  . Heart attack Father [id number]  . Arthritis Father   . Hyperlipidemia Mother   . Other Mother     abnormal EKG's  . Arthritis Mother   . Parkinsonism  Other     Review of Systems: The review of systems is per the HPI.  All other systems were reviewed and are negative.  Physical Exam: Ht 5\' 5"  (1.651 m)  Wt 142 lb 6.4 oz (64.592 kg)  BMI 23.70 kg/m2 Patient is very pleasant and in no acute distress. Skin is warm and dry. Color is normal.  HEENT is unremarkable. Normocephalic/atraumatic. PERRL. Sclera are nonicteric. Neck is supple. No masses. No JVD. Lungs are clear. Cardiac exam shows a regular rate and rhythm. Abdomen is soft. Extremities are without edema. Gait and ROM are intact. No gross neurologic deficits noted.  Wt Readings from Last 3 Encounters:  02/15/13 142 lb 6.4 oz (64.592 kg)  07/29/11 132 lb 6.4 oz (60.056 kg)  03/10/11 136 lb 12.8 oz (62.052 kg)     LABORATORY DATA:  EKG today shows sinus with LBBB - rate of 68.   Lab Results  Component Value Date   WBC 5.8 03/26/2011   HGB 14.3 03/26/2011   HCT 42.3 03/26/2011   PLT 227.0 03/26/2011   GLUCOSE 94 03/26/2011   CHOL 155 11/03/2011   TRIG 121.0 11/03/2011   HDL 44.80 11/03/2011   LDLDIRECT 154.2 03/26/2011   LDLCALC 86 11/03/2011   ALT  23 11/03/2011   AST 21 11/03/2011   NA 141 03/26/2011   K 4.3 03/26/2011   CL 107 03/26/2011   CREATININE 0.8 03/26/2011   BUN 16 03/26/2011   CO2 25 03/26/2011   TSH 1.48 03/26/2011     Assessment / Plan: 1. LBBB with associated dyssynchrony but with a normal EF - stable.   2. HLD - needs labs checked today - Lipitor refilled as well.  See her back tentatively in 2 years. She will need follow up lab in the interim but does not need a formal visit unless she is having trouble.   Patient is agreeable to this plan and will call if any problems develop in the interim.   Rosalio Macadamia, RN, ANP-C [facility] [address] [place], [place]  [address] [phone]

## 2013-02-16 ENCOUNTER — Telehealth: Payer: Self-pay | Admitting: Cardiology

## 2013-02-16 DIAGNOSIS — E785 Hyperlipidemia, unspecified: Secondary | ICD-10-CM

## 2013-02-16 NOTE — Telephone Encounter (Signed)
Discussed lab results from 02/15/13 with patient.  She wanted to look at her calendar before scheduling fasting lipid profile in 6 months.

## 2013-02-16 NOTE — Telephone Encounter (Signed)
Follow Up ° °Pt returned call for results. Please call  °

## 2013-12-22 ENCOUNTER — Encounter: Payer: Self-pay | Admitting: Family

## 2013-12-22 ENCOUNTER — Telehealth: Payer: Self-pay | Admitting: Internal Medicine

## 2013-12-22 ENCOUNTER — Ambulatory Visit (INDEPENDENT_AMBULATORY_CARE_PROVIDER_SITE_OTHER): Payer: BC Managed Care – PPO | Admitting: Family

## 2013-12-22 VITALS — BP 122/72 | HR 80 | Temp 98.6°F | Resp 18 | Ht 63.5 in | Wt 134.8 lb

## 2013-12-22 DIAGNOSIS — J019 Acute sinusitis, unspecified: Secondary | ICD-10-CM

## 2013-12-22 MED ORDER — AMOXICILLIN-POT CLAVULANATE 875-125 MG PO TABS: 1.0000 | ORAL_TABLET | Freq: Two times a day (BID) | ORAL | Status: DC

## 2013-12-22 MED ORDER — DOXYCYCLINE HYCLATE 100 MG PO TABS: 100.0000 mg | ORAL_TABLET | Freq: Two times a day (BID) | ORAL | Status: DC

## 2013-12-22 NOTE — Telephone Encounter (Signed)
Pt states she cannot take the augmentin that was prescribed, the pills are like "horse" pills and even when she breaks into 4's there are still to big to swallow, requesting another kind.

## 2013-12-22 NOTE — Patient Instructions (Signed)
Thank you for choosing [facility].  Summary/Instructions:  Your prescription(s) have been submitted to your pharmacy. Please take as directed and contact our office if you believe you are having problem(s) with the medication(s).  If your symptoms worsen or fail to improve, please contact our office for further instruction, or in case of emergency go directly to the emergency room at the closest medical facility.   General Recommendations: Please drink plenty of fluids. Sleep in humidified air if possible. Use saline nasal sprays or the Netti pot as needed.   Medicaitons:  Flonase as needed to assist with congestion. You may use Mucinex to help break up congestion as you feel is needed. Tylenol as needed for fever. Sudafed for congestion.     Sinusitis Sinusitis is redness, soreness, and inflammation of the paranasal sinuses. Paranasal sinuses are air pockets within the bones of your face (beneath the eyes, the middle of the forehead, or above the eyes). In healthy paranasal sinuses, mucus is able to drain out, and air is able to circulate through them by way of your nose. However, when your paranasal sinuses are inflamed, mucus and air can become trapped. This can allow bacteria and other germs to grow and cause infection. Sinusitis can develop quickly and last only a short time (acute) or continue over a long period (chronic). Sinusitis that lasts for more than 12 weeks is considered chronic.  CAUSES  Causes of sinusitis include:  Allergies.  Structural abnormalities, such as displacement of the cartilage that separates your nostrils (deviated septum), which can decrease the air flow through your nose and sinuses and affect sinus drainage.  Functional abnormalities, such as when the small hairs (cilia) that line your sinuses and help remove mucus do not work properly or are not present. SIGNS AND SYMPTOMS  Symptoms of acute and chronic sinusitis are the same. The primary symptoms  are pain and pressure around the affected sinuses. Other symptoms include:  Upper toothache.  Earache.  Headache.  Bad breath.  Decreased sense of smell and taste.  A cough, which worsens when you are lying flat.  Fatigue.  Fever.  Thick drainage from your nose, which often is green and may contain pus (purulent).  Swelling and warmth over the affected sinuses. DIAGNOSIS  Your health care provider will perform a physical exam. During the exam, your health care provider may:  Look in your nose for signs of abnormal growths in your nostrils (nasal polyps).  Tap over the affected sinus to check for signs of infection.  View the inside of your sinuses (endoscopy) using an imaging device that has a light attached (endoscope). If your health care provider suspects that you have chronic sinusitis, one or more of the following tests may be recommended:  Allergy tests.  Nasal culture. A sample of mucus is taken from your nose, sent to a lab, and screened for bacteria.  Nasal cytology. A sample of mucus is taken from your nose and examined by your health care provider to determine if your sinusitis is related to an allergy. TREATMENT  Most cases of acute sinusitis are related to a viral infection and will resolve on their own within 10 days. Sometimes medicines are prescribed to help relieve symptoms (pain medicine, decongestants, nasal steroid sprays, or saline sprays).  However, for sinusitis related to a bacterial infection, your health care provider will prescribe antibiotic medicines. These are medicines that will help kill the bacteria causing the infection.  Rarely, sinusitis is caused by a fungal   infection. In theses cases, your health care provider will prescribe antifungal medicine. For some cases of chronic sinusitis, surgery is needed. Generally, these are cases in which sinusitis recurs more than 3 times per year, despite other treatments. HOME CARE INSTRUCTIONS   Drink  plenty of water. Water helps thin the mucus so your sinuses can drain more easily.  Use a humidifier.  Inhale steam 3 to 4 times a day (for example, sit in the bathroom with the shower running).  Apply a warm, moist washcloth to your face 3 to 4 times a day, or as directed by your health care provider.  Use saline nasal sprays to help moisten and clean your sinuses.  Take medicines only as directed by your health care provider.  If you were prescribed either an antibiotic or antifungal medicine, finish it all even if you start to feel better. SEEK IMMEDIATE MEDICAL CARE IF:  You have increasing pain or severe headaches.  You have nausea, vomiting, or drowsiness.  You have swelling around your face.  You have vision problems.  You have a stiff neck.  You have difficulty breathing. MAKE SURE YOU:   Understand these instructions.  Will watch your condition.  Will get help right away if you are not doing well or get worse. Document Released: 01/05/2005 Document Revised: 05/22/2013 Document Reviewed: 01/20/2011 ExitCare Patient Information 2015 [employer]. This information is not intended to replace advice given to you by your health care provider. Make sure you discuss any questions you have with your health care provider.  

## 2013-12-22 NOTE — Assessment & Plan Note (Signed)
Symptoms and exam consistent with bacterial sinusitis. Start Augmentin x10 days. Use over-the-counter medications as needed for symptom relief. Discussed sinus care with the patient. Follow up if symptoms worsen or fail to improve.

## 2013-12-22 NOTE — Telephone Encounter (Signed)
Pt aware.

## 2013-12-22 NOTE — Progress Notes (Signed)
Pre visit review using our clinic review tool, if applicable. No additional management support is needed unless otherwise documented below in the visit note. 

## 2013-12-22 NOTE — Progress Notes (Signed)
   Subjective:    Patient ID: Dawn Perry, female    DOB: 1950-01-29, 63 y.o.   MRN: [id number]  Chief Complaint  Patient presents with  . Cough    congestion, cough, sob, wheezing, tired x1 week    HPI:  Dawn Perry is a 63 y.o. female who presents today for an acute visit.   Acute symptoms of congestion, cough, shortness of breath, and wheezing started about one week ago. Indicates the cough has been going on for about a month. Describes sinus pressure with green nasal discharge. Coughing is worse in the morning and evening. Denies any fever. Has tried over the counter motrin with minimal relief. Has been noticing she is getting progressively worse.   No Known Allergies  Current Outpatient Prescriptions on File Prior to Visit  Medication Sig Dispense Refill  . atorvastatin (LIPITOR) 40 MG tablet Take 1 tablet (40 mg total) by mouth as needed (take medication when you remember to). TAKE 1 TABLET EVERY DAY 90 tablet 3   No current facility-administered medications on file prior to visit.   Review of Systems    See HPI  Objective:    BP 122/72 mmHg  Pulse 80  Temp(Src) 98.6 F (37 C) (Oral)  Resp 18  Ht 5' 3.5" (1.613 m)  Wt 134 lb 12.8 oz (61.145 kg)  BMI 23.50 kg/m2  SpO2 97% Nursing note and vital signs reviewed.  Physical Exam  Constitutional: She is oriented to person, place, and time. She appears well-developed and well-nourished. No distress.  HENT:  Right Ear: Hearing, tympanic membrane, external ear and ear canal normal.  Left Ear: Hearing, tympanic membrane, external ear and ear canal normal.  Nose: Right sinus exhibits maxillary sinus tenderness. Right sinus exhibits no frontal sinus tenderness. Left sinus exhibits maxillary sinus tenderness. Left sinus exhibits no frontal sinus tenderness.  Mouth/Throat: Uvula is midline, oropharynx is clear and moist and mucous membranes are normal.  Cardiovascular: Normal rate, regular rhythm and normal heart  sounds.   Pulmonary/Chest: Effort normal and breath sounds normal.  Lymphadenopathy:    She has no cervical adenopathy.  Neurological: She is alert and oriented to person, place, and time.  Skin: Skin is warm and dry.  Skin feels hot to touch.   Psychiatric: She has a normal mood and affect. Her behavior is normal. Judgment and thought content normal.       Assessment & Plan:

## 2013-12-22 NOTE — Telephone Encounter (Signed)
Please notify patient that doxycycline was sent to her pharmacy.

## 2013-12-27 ENCOUNTER — Ambulatory Visit (INDEPENDENT_AMBULATORY_CARE_PROVIDER_SITE_OTHER): Payer: BC Managed Care – PPO | Admitting: Nurse Practitioner

## 2013-12-27 ENCOUNTER — Ambulatory Visit: Payer: BC Managed Care – PPO | Admitting: Nurse Practitioner

## 2013-12-27 ENCOUNTER — Encounter: Payer: Self-pay | Admitting: Nurse Practitioner

## 2013-12-27 VITALS — BP 132/68 | HR 57 | Ht 64.0 in | Wt 136.8 lb

## 2013-12-27 DIAGNOSIS — I447 Left bundle-branch block, unspecified: Secondary | ICD-10-CM

## 2013-12-27 DIAGNOSIS — E785 Hyperlipidemia, unspecified: Secondary | ICD-10-CM

## 2013-12-27 MED ORDER — ATORVASTATIN CALCIUM 40 MG PO TABS: 40.0000 mg | ORAL_TABLET | ORAL | Status: DC | PRN

## 2013-12-27 NOTE — Progress Notes (Signed)
Jamse MeadJanine S Conlee Date of Birth: 08-11-1950 Medical Record #[id number]  History of Present Illness: Ms. Dawn Perry is seen back today for a follow up visit. Approximate one year check. Seen for Dr. Shirlee LatchMcLean. Previously seen by Dr. Deborah Chalkennant. Remote negative Myoview in 2004 when her LBBB was noted. She has LBBB on EKG with echo from 2002 showing septal lateral dyssynchrony with a normal EF and mild MR, HLD, and remote scarlet fever. Her last echo from 2012 showed an EF 55% and mild MR. Tends to get vagal with blood draws.   Last seen here about one year ago - was doing ok. Needed her medicines refilled.   Comes back today. Here alone. Not fasting today. Needs Lipitor refilled. Doing well clinically. No chest pain. Not short of breath. Feels good. Her mother died back in October - had a hemorrhagic stroke. She was 87. She has had a recent bout of sinusitis - now improving with antibiotics.  Current Outpatient Prescriptions  Medication Sig Dispense Refill  . atorvastatin (LIPITOR) 40 MG tablet Take 1 tablet (40 mg total) by mouth as needed (take medication when you remember to). TAKE 1 TABLET EVERY DAY 90 tablet 3  . doxycycline (VIBRA-TABS) 100 MG tablet Take 1 tablet (100 mg total) by mouth 2 (two) times daily. 20 tablet 0   No current facility-administered medications for this visit.    No Known Allergies  Past Medical History  Diagnosis Date  . LBBB (left bundle branch block)   . H/O scarlet fever     age 63  . Hyperlipidemia   . Osteoarthritis   . GERD (gastroesophageal reflux disease)     B hands  . History of chicken pox     Past Surgical History  Procedure Laterality Date  . Right shoulder tear  1990  . Broke left forearm      History  Smoking status  . Never Smoker   Smokeless tobacco  . Never Used    History  Alcohol Use  . Yes    Comment: occasionally a glass of wine    Family History  Problem Relation Age of Onset  . Heart attack Father [id number]  . Arthritis  Father   . Hyperlipidemia Mother   . Other Mother     abnormal EKG's  . Arthritis Mother   . Stroke Mother     hemorrhagic  . Parkinsonism Other     Review of Systems: The review of systems is per the HPI.  All other systems were reviewed and are negative.  Physical Exam: BP 132/68 mmHg  Pulse 57  Ht 5\' 4"  (1.626 m)  Wt 136 lb 12.8 oz (62.052 kg)  BMI 23.47 kg/m2  SpO2 100% Patient is very pleasant and in no acute distress. Skin is warm and dry. Color is normal.  HEENT is unremarkable. Normocephalic/atraumatic. PERRL. Sclera are nonicteric. Neck is supple. No masses. No JVD. Lungs are clear. Cardiac exam shows a regular rate and rhythm. Abdomen is soft. Extremities are without edema. Gait and ROM are intact. No gross neurologic deficits noted.  Wt Readings from Last 3 Encounters:  12/27/13 136 lb 12.8 oz (62.052 kg)  12/22/13 134 lb 12.8 oz (61.145 kg)  02/15/13 142 lb 6.4 oz (64.592 kg)    LABORATORY DATA/PROCEDURES:  Lab Results  Component Value Date   WBC 4.7 02/15/2013   HGB 13.7 02/15/2013   HCT 41.2 02/15/2013   PLT 255.0 02/15/2013   GLUCOSE 92 02/15/2013   CHOL 207* 02/15/2013  TRIG 253.0* 02/15/2013   HDL 42.40 02/15/2013   LDLDIRECT 132.0 02/15/2013   LDLCALC 86 11/03/2011   ALT 25 02/15/2013   AST 22 02/15/2013   NA 138 02/15/2013   K 4.3 02/15/2013   CL 107 02/15/2013   CREATININE 0.7 02/15/2013   BUN 14 02/15/2013   CO2 25 02/15/2013   TSH 1.48 03/26/2011    BNP (last 3 results) No results for input(s): PROBNP in the last 8760 hours.   Assessment / Plan: 1. LBBB with associated dyssynchrony but with a normal EF noted by echo in 2012 - she is totally asymptomatic.    2. HLD - needs labs checked when fasting - [facility] Lipitor refilled as well.  See back in about a year and a half. She will get her fasting labs with the next month.    Patient is agreeable to this plan and will call if any problems develop in the interim.     Rosalio MacadamiaLori C. Nichalos Brenton, RN, ANP-C [facility] [address] [place], [place]  [id number] [phone]

## 2013-12-27 NOTE — Patient Instructions (Addendum)
Fasting labs within the next month  I have refilled your Lipitor today  I will see you in about a year and a half.   Call the [facility] office at [phone] if you have any questions, problems or concerns.

## 2014-02-14 ENCOUNTER — Ambulatory Visit: Payer: BC Managed Care – PPO | Admitting: Internal Medicine

## 2014-02-27 ENCOUNTER — Ambulatory Visit: Payer: Self-pay | Admitting: Internal Medicine

## 2014-04-10 ENCOUNTER — Ambulatory Visit: Payer: Self-pay | Admitting: Internal Medicine

## 2014-04-17 ENCOUNTER — Encounter: Payer: Self-pay | Admitting: Internal Medicine

## 2014-04-17 ENCOUNTER — Ambulatory Visit (INDEPENDENT_AMBULATORY_CARE_PROVIDER_SITE_OTHER): Payer: BLUE CROSS/BLUE SHIELD | Admitting: Internal Medicine

## 2014-04-17 VITALS — BP 132/84 | HR 64 | Temp 97.8°F | Resp 16 | Wt 138.0 lb

## 2014-04-17 DIAGNOSIS — E785 Hyperlipidemia, unspecified: Secondary | ICD-10-CM

## 2014-04-17 DIAGNOSIS — Z0000 Encounter for general adult medical examination without abnormal findings: Secondary | ICD-10-CM

## 2014-04-17 MED ORDER — ATORVASTATIN CALCIUM 40 MG PO TABS: 40.0000 mg | ORAL_TABLET | Freq: Every day | ORAL | Status: DC

## 2014-04-17 NOTE — Progress Notes (Signed)
Pre visit review using our clinic review tool, if applicable. No additional management support is needed unless otherwise documented below in the visit note. 

## 2014-04-17 NOTE — Patient Instructions (Signed)
We have put in for the labs and the refills. Come back in the morning before you eat.   Work on starting to exercise as this will help keep the heart and body healthy.   Think about doing a colon cancer screening at some point even if you are not having symptoms as colon cancer is one of the most preventable forms of cancer.   Health Maintenance Adopting a healthy lifestyle and getting preventive care can go a long way to promote health and wellness. Talk with your health care provider about what schedule of regular examinations is right for you. This is a good chance for you to check in with your provider about disease prevention and staying healthy. In between checkups, there are plenty of things you can do on your own. Experts have done a lot of research about which lifestyle changes and preventive measures are most likely to keep you healthy. Ask your health care provider for more information. WEIGHT AND DIET  Eat a healthy diet  Be sure to include plenty of vegetables, fruits, low-fat dairy products, and lean protein.  Do not eat a lot of foods high in solid fats, added sugars, or salt.  Get regular exercise. This is one of the most important things you can do for your health.  Most adults should exercise for at least 150 minutes each week. The exercise should increase your heart rate and make you sweat (moderate-intensity exercise).  Most adults should also do strengthening exercises at least twice a week. This is in addition to the moderate-intensity exercise.  Maintain a healthy weight  Body mass index (BMI) is a measurement that can be used to identify possible weight problems. It estimates body fat based on height and weight. Your health care provider can help determine your BMI and help you achieve or maintain a healthy weight.  For females 42 years of age and older:   A BMI below 18.5 is considered underweight.  A BMI of 18.5 to 24.9 is normal.  A BMI of 25 to 29.9 is  considered overweight.  A BMI of 30 and above is considered obese.  Watch levels of cholesterol and blood lipids  You should start having your blood tested for lipids and cholesterol at 64 years of age, then have this test every 5 years.  You may need to have your cholesterol levels checked more often if:  Your lipid or cholesterol levels are high.  You are older than 64 years of age.  You are at high risk for heart disease.  CANCER SCREENING   Lung Cancer  Lung cancer screening is recommended for adults 44-55 years old who are at high risk for lung cancer because of a history of smoking.  A yearly low-dose CT scan of the lungs is recommended for people who:  Currently smoke.  Have quit within the past 15 years.  Have at least a 30-pack-year history of smoking. A pack year is smoking an average of one pack of cigarettes a day for 1 year.  Yearly screening should continue until it has been 15 years since you quit.  Yearly screening should stop if you develop a health problem that would prevent you from having lung cancer treatment.  Breast Cancer  Practice breast self-awareness. This means understanding how your breasts normally appear and feel.  It also means doing regular breast self-exams. Let your health care provider know about any changes, no matter how small.  If you are in your 36s or  26s, you should have a clinical breast exam (CBE) by a health care provider every 1-3 years as part of a regular health exam.  If you are 73 or older, have a CBE every year. Also consider having a breast X-ray (mammogram) every year.  If you have a family history of breast cancer, talk to your health care provider about genetic screening.  If you are at high risk for breast cancer, talk to your health care provider about having an MRI and a mammogram every year.  Breast cancer gene (BRCA) assessment is recommended for women who have family members with BRCA-related cancers.  BRCA-related cancers include:  Breast.  Ovarian.  Tubal.  Peritoneal cancers.  Results of the assessment will determine the need for genetic counseling and BRCA1 and BRCA2 testing. Cervical Cancer Routine pelvic examinations to screen for cervical cancer are no longer recommended for nonpregnant women who are considered low risk for cancer of the pelvic organs (ovaries, uterus, and vagina) and who do not have symptoms. A pelvic examination may be necessary if you have symptoms including those associated with pelvic infections. Ask your health care provider if a screening pelvic exam is right for you.   The Pap test is the screening test for cervical cancer for women who are considered at risk.  If you had a hysterectomy for a problem that was not cancer or a condition that could lead to cancer, then you no longer need Pap tests.  If you are older than 65 years, and you have had normal Pap tests for the past 10 years, you no longer need to have Pap tests.  If you have had past treatment for cervical cancer or a condition that could lead to cancer, you need Pap tests and screening for cancer for at least 20 years after your treatment.  If you no longer get a Pap test, assess your risk factors if they change (such as having a new sexual partner). This can affect whether you should start being screened again.  Some women have medical problems that increase their chance of getting cervical cancer. If this is the case for you, your health care provider may recommend more frequent screening and Pap tests.  The human papillomavirus (HPV) test is another test that may be used for cervical cancer screening. The HPV test looks for the virus that can cause cell changes in the cervix. The cells collected during the Pap test can be tested for HPV.  The HPV test can be used to screen women 84 years of age and older. Getting tested for HPV can extend the interval between normal Pap tests from three to  five years.  An HPV test also should be used to screen women of any age who have unclear Pap test results.  After 64 years of age, women should have HPV testing as often as Pap tests.  Colorectal Cancer  This type of cancer can be detected and often prevented.  Routine colorectal cancer screening usually begins at 64 years of age and continues through 64 years of age.  Your health care provider may recommend screening at an earlier age if you have risk factors for colon cancer.  Your health care provider may also recommend using home test kits to check for hidden blood in the stool.  A small camera at the end of a tube can be used to examine your colon directly (sigmoidoscopy or colonoscopy). This is done to check for the earliest forms of colorectal cancer.  Routine screening usually begins at age 80.  Direct examination of the colon should be repeated every 5-10 years through 64 years of age. However, you may need to be screened more often if early forms of precancerous polyps or small growths are found. Skin Cancer  Check your skin from head to toe regularly.  Tell your health care provider about any new moles or changes in moles, especially if there is a change in a mole's shape or color.  Also tell your health care provider if you have a mole that is larger than the size of a pencil eraser.  Always use sunscreen. Apply sunscreen liberally and repeatedly throughout the day.  Protect yourself by wearing long sleeves, pants, a wide-brimmed hat, and sunglasses whenever you are outside. HEART DISEASE, DIABETES, AND HIGH BLOOD PRESSURE   Have your blood pressure checked at least every 1-2 years. High blood pressure causes heart disease and increases the risk of stroke.  If you are between 64 years and 76 years old, ask your health care provider if you should take aspirin to prevent strokes.  Have regular diabetes screenings. This involves taking a blood sample to check your  fasting blood sugar level.  If you are at a normal weight and have a low risk for diabetes, have this test once every three years after 64 years of age.  If you are overweight and have a high risk for diabetes, consider being tested at a younger age or more often. PREVENTING INFECTION  Hepatitis B  If you have a higher risk for hepatitis B, you should be screened for this virus. You are considered at high risk for hepatitis B if:  You were born in a country where hepatitis B is common. Ask your health care provider which countries are considered high risk.  Your parents were born in a high-risk country, and you have not been immunized against hepatitis B (hepatitis B vaccine).  You have HIV or AIDS.  You use needles to inject street drugs.  You live with someone who has hepatitis B.  You have had sex with someone who has hepatitis B.  You get hemodialysis treatment.  You take certain medicines for conditions, including cancer, organ transplantation, and autoimmune conditions. Hepatitis C  Blood testing is recommended for:  Everyone born from [birth year] through 1965.  Anyone with known risk factors for hepatitis C. Sexually transmitted infections (STIs)  You should be screened for sexually transmitted infections (STIs) including gonorrhea and chlamydia if:  You are sexually active and are younger than 64 years of age.  You are older than 64 years of age and your health care provider tells you that you are at risk for this type of infection.  Your sexual activity has changed since you were last screened and you are at an increased risk for chlamydia or gonorrhea. Ask your health care provider if you are at risk.  If you do not have HIV, but are at risk, it may be recommended that you take a prescription medicine daily to prevent HIV infection. This is called pre-exposure prophylaxis (PrEP). You are considered at risk if:  You are sexually active and do not regularly use condoms or  know the HIV status of your partner(s).  You take drugs by injection.  You are sexually active with a partner who has HIV. Talk with your health care provider about whether you are at high risk of being infected with HIV. If you choose to begin PrEP, you should first  be tested for HIV. You should then be tested every 3 months for as long as you are taking PrEP.  PREGNANCY   If you are premenopausal and you may become pregnant, ask your health care provider about preconception counseling.  If you may become pregnant, take 400 to 800 micrograms (mcg) of folic acid every day.  If you want to prevent pregnancy, talk to your health care provider about birth control (contraception). OSTEOPOROSIS AND MENOPAUSE   Osteoporosis is a disease in which the bones lose minerals and strength with aging. This can result in serious bone fractures. Your risk for osteoporosis can be identified using a bone density scan.  If you are 99 years of age or older, or if you are at risk for osteoporosis and fractures, ask your health care provider if you should be screened.  Ask your health care provider whether you should take a calcium or vitamin D supplement to lower your risk for osteoporosis.  Menopause may have certain physical symptoms and risks.  Hormone replacement therapy may reduce some of these symptoms and risks. Talk to your health care provider about whether hormone replacement therapy is right for you.  HOME CARE INSTRUCTIONS   Schedule regular health, dental, and eye exams.  Stay current with your immunizations.   Do not use any tobacco products including cigarettes, chewing tobacco, or electronic cigarettes.  If you are pregnant, do not drink alcohol.  If you are breastfeeding, limit how much and how often you drink alcohol.  Limit alcohol intake to no more than 1 drink per day for nonpregnant women. One drink equals 12 ounces of beer, 5 ounces of wine, or 1 ounces of hard liquor.  Do  not use street drugs.  Do not share needles.  Ask your health care provider for help if you need support or information about quitting drugs.  Tell your health care provider if you often feel depressed.  Tell your health care provider if you have ever been abused or do not feel safe at home. Document Released: 07/21/2010 Document Revised: 05/22/2013 Document Reviewed: 12/07/2012 [facility] Patient Information 2015 [place], [place]. This information is not intended to replace advice given to you by your health care provider. Make sure you discuss any questions you have with your health care provider.

## 2014-04-20 NOTE — Assessment & Plan Note (Signed)
Check LFTs and CK to make sure no adverse effect from the statin. Encouraged her to continue taking it. Also strongly encouraged her to start exercising to keep herself healthy. Also check lipid panel to make sure she is at goal.

## 2014-04-20 NOTE — Progress Notes (Signed)
   Subjective:    Patient ID: Dawn Perry, female    DOB: 09-15-1950, 64 y.o.   MRN: [id number]  HPI The patient is a 64 YO female who is coming in to follow up on her cholesterol levels. She is still taking the lipitor but wonders if maybe it is making her a little sore. She is still able to take it. She has not started exercising yet and needs to do that. Wants to know if the medicine is still working.   Review of Systems  Constitutional: Negative for fever, activity change, appetite change and unexpected weight change.  Respiratory: Negative for cough, chest tightness, shortness of breath and wheezing.   Cardiovascular: Negative for chest pain, palpitations and leg swelling.  Gastrointestinal: Negative for nausea, abdominal pain, diarrhea and abdominal distention.  Musculoskeletal: Positive for myalgias and arthralgias.  Skin: Negative.   Neurological: Negative.       Objective:   Physical Exam  Constitutional: She is oriented to person, place, and time. She appears well-developed and well-nourished.  HENT:  Head: Normocephalic and atraumatic.  Eyes: EOM are normal.  Neck: Normal range of motion.  Cardiovascular: Normal rate and regular rhythm.   Pulmonary/Chest: Effort normal and breath sounds normal.  Abdominal: Soft.  Musculoskeletal: She exhibits no edema.  Neurological: She is alert and oriented to person, place, and time. Coordination normal.  Skin: Skin is warm and dry. No rash noted.   Filed Vitals:   04/17/14 1037  BP: 132/84  Pulse: 64  Temp: 97.8 F (36.6 C)  TempSrc: Oral  Resp: 16  Weight: 138 lb (62.596 kg)  SpO2: 97%      Assessment & Plan:

## 2014-06-20 ENCOUNTER — Telehealth: Payer: Self-pay | Admitting: Geriatric Medicine

## 2014-06-20 DIAGNOSIS — Z1231 Encounter for screening mammogram for malignant neoplasm of breast: Secondary | ICD-10-CM

## 2014-06-20 NOTE — Telephone Encounter (Signed)
Called and spoke with patient. She is going to go get her mammogram done at [facility]. I am placing orders and she is going to call and check with her insurance and schedule it.

## 2015-05-16 DIAGNOSIS — M1712 Unilateral primary osteoarthritis, left knee: Secondary | ICD-10-CM | POA: Diagnosis not present

## 2015-06-26 ENCOUNTER — Ambulatory Visit: Payer: BC Managed Care – PPO | Admitting: Nurse Practitioner

## 2015-07-16 ENCOUNTER — Encounter: Payer: Self-pay | Admitting: Nurse Practitioner

## 2015-07-16 ENCOUNTER — Telehealth: Payer: Self-pay | Admitting: *Deleted

## 2015-07-16 ENCOUNTER — Ambulatory Visit (INDEPENDENT_AMBULATORY_CARE_PROVIDER_SITE_OTHER): Payer: Medicare Other | Admitting: Nurse Practitioner

## 2015-07-16 VITALS — BP 150/82 | HR 54 | Ht 65.0 in | Wt 139.5 lb

## 2015-07-16 DIAGNOSIS — E785 Hyperlipidemia, unspecified: Secondary | ICD-10-CM

## 2015-07-16 DIAGNOSIS — I447 Left bundle-branch block, unspecified: Secondary | ICD-10-CM | POA: Diagnosis not present

## 2015-07-16 LAB — HEPATIC FUNCTION PANEL
ALT: 25 U/L (ref 6–29)
AST: 22 U/L (ref 10–35)
Albumin: 4.4 g/dL (ref 3.6–5.1)
Alkaline Phosphatase: 77 U/L (ref 33–130)
Bilirubin, Direct: 0.1 mg/dL (ref <=0.2)
Indirect Bilirubin: 0.7 mg/dL (ref 0.2–1.2)
Total Bilirubin: 0.8 mg/dL (ref 0.2–1.2)
Total Protein: 6.9 g/dL (ref 6.1–8.1)

## 2015-07-16 LAB — BASIC METABOLIC PANEL
BUN: 17 mg/dL (ref 7–25)
CO2: 23 mmol/L (ref 20–31)
Calcium: 9.6 mg/dL (ref 8.6–10.4)
Chloride: 102 mmol/L (ref 98–110)
Creat: 0.77 mg/dL (ref 0.50–0.99)
Glucose, Bld: 83 mg/dL (ref 65–99)
Potassium: 4.9 mmol/L (ref 3.5–5.3)
Sodium: 138 mmol/L (ref 135–146)

## 2015-07-16 LAB — LIPID PANEL
Cholesterol: 196 mg/dL (ref 125–200)
HDL: 50 mg/dL (ref >=46)
LDL Cholesterol: 106 mg/dL (ref <130)
Total CHOL/HDL Ratio: 3.9 Ratio (ref <=5.0)
Triglycerides: 200 mg/dL — ABNORMAL HIGH (ref <150)
VLDL: 40 mg/dL — ABNORMAL HIGH (ref <30)

## 2015-07-16 NOTE — Progress Notes (Signed)
CARDIOLOGY OFFICE NOTE  Date:  07/16/2015    Dawn Perry Date of Birth: Jun 13, 1950 Medical Record #[id number]  PCP:  Myrlene BrokerElizabeth A Crawford, MD  Cardiologist:  Emily FilbertMcLean & Ronnald Shedden    Chief Complaint  Patient presents with  . LBBB/HLD    Follow up visit - seen for Dr. Shirlee LatchMcLean    History of Present Illness: Dawn Perry is a 65 y.o. female who presents today for a follow up visit. Seen for Dr. Shirlee LatchMcLean. Previously seen by Dr. Deborah Chalkennant.   Remote negative Myoview in 2004 when her LBBB was noted. She has LBBB on EKG with echo from 2002 showing septal lateral dyssynchrony with a normal EF and mild MR, HLD, and remote scarlet fever. Her last echo from 2012 showed an EF 55% and mild MR. Tends to get vagal with blood draws.   Last seen here about a year and a half ago - was doing well.   Comes back today. Here alone. Says her biggest complaint is that "her body aches all the time". She feels like this is arthritis. She is taking some Motrin. She remains on her Lipitor as well. No chest pain. Not short of breath. Not dizzy or lightheaded.   Past Medical History  Diagnosis Date  . LBBB (left bundle branch block)   . H/O scarlet fever     age 65  . Hyperlipidemia   . Osteoarthritis   . GERD (gastroesophageal reflux disease)     B hands  . History of chicken pox     Past Surgical History  Procedure Laterality Date  . Right shoulder tear  1990  . Broke left forearm       Medications: Current Outpatient Prescriptions  Medication Sig Dispense Refill  . atorvastatin (LIPITOR) 40 MG tablet Take 1 tablet (40 mg total) by mouth daily. 90 tablet 3  . Ibuprofen (MOTRIN PO) Take 600 mg by mouth as needed (moderate pain).     No current facility-administered medications for this visit.    Allergies: No Known Allergies  Social History: The patient  reports that she has never smoked. She has never used smokeless tobacco. She reports that she drinks alcohol. She reports that  she does not use illicit drugs.   Family History: The patient's family history includes Arthritis in her father and mother; Heart attack (age of onset: 2774) in her father; Hyperlipidemia in her mother; Other in her mother; Parkinsonism in her other; Stroke in her mother.   Review of Systems: Please see the history of present illness.   Otherwise, the review of systems is positive for none.   All other systems are reviewed and negative.   Physical Exam: VS:  BP 150/82 mmHg  Pulse 54  Ht 5\' 5"  (1.651 m)  Wt 139 lb 8 oz (63.277 kg)  BMI 23.21 kg/m2 .  BMI Body mass index is 23.21 kg/(m^2).  Wt Readings from Last 3 Encounters:  07/16/15 139 lb 8 oz (63.277 kg)  04/17/14 138 lb (62.596 kg)  12/27/13 136 lb 12.8 oz (62.052 kg)    General: Pleasant. Well developed, well nourished and in no acute distress.  HEENT: Normal. Neck: Supple, no JVD, carotid bruits, or masses noted.  Cardiac: Regular rate and rhythm. No murmurs, rubs, or gallops. No edema.  Respiratory:  Lungs are clear to auscultation bilaterally with normal work of breathing.  GI: Soft and nontender.  MS: No deformity or atrophy. Gait and ROM intact. Skin: Warm and dry. Color  is normal.  Neuro:  Strength and sensation are intact and no gross focal deficits noted.  Psych: Alert, appropriate and with normal affect.   LABORATORY DATA:  EKG:  EKG is ordered today. This demonstrates sinus brady with LBBB.  Lab Results  Component Value Date   WBC 4.7 02/15/2013   HGB 13.7 02/15/2013   HCT 41.2 02/15/2013   PLT 255.0 02/15/2013   GLUCOSE 92 02/15/2013   CHOL 207* 02/15/2013   TRIG 253.0* 02/15/2013   HDL 42.40 02/15/2013   LDLDIRECT 132.0 02/15/2013   LDLCALC 86 11/03/2011   ALT 25 02/15/2013   AST 22 02/15/2013   NA 138 02/15/2013   K 4.3 02/15/2013   CL 107 02/15/2013   CREATININE 0.7 02/15/2013   BUN 14 02/15/2013   CO2 25 02/15/2013   TSH 1.48 03/26/2011    BNP (last 3 results) No results for input(s): BNP  in the last 8760 hours.  ProBNP (last 3 results) No results for input(s): PROBNP in the last 8760 hours.   Other Studies Reviewed Today:   Assessment/Plan: 1. LBBB with associated dyssynchrony but with a normal EF noted by echo in 2012 - she is totally asymptomatic.   2. HLD - on statin - but with diffuse aching - will give her a drug holiday for the next month. Then decide about further treatment.   3. Elevated BP - she is quite anxious about getting labs today - she is agreeable to monitoring BP at home. Goal is 135/85 or less.   Current medicines are reviewed with the patient today.  The patient does not have concerns regarding medicines other than what has been noted above.  The following changes have been made:  See above.  Labs/ tests ordered today include:    Orders Placed This Encounter  Procedures  . Basic metabolic panel  . Hepatic function panel  . Lipid panel  . EKG 12-Lead     Disposition:   FU with me in one year.    Patient is agreeable to this plan and will call if any problems develop in the interim.   Signed: Rosalio MacadamiaLori C. Deshundra Waller, RN, ANP-C 07/16/2015 11:47 AM  [facility] [address] [place], [place]  [address] Phone: [phone]) [phone] Fax: [phone]

## 2015-07-16 NOTE — Patient Instructions (Addendum)
We will be checking the following labs today - BMET, Lipids and LFTs   Medication Instructions:    Continue with your current medicines. BUT  Hold the Lipitor for the next month - lets see if this helps your aching get better.   Try to minimize the Motrin use    Testing/Procedures To Be Arranged:  N/A  Follow-Up:   See me in a year  Send me a message in about a month about how you feel off of Lipitor and what your BP readings are.     Other Special Instructions:   Get an Omron BP cuff to check your blood pressure - goal is to be below 135/85 most of the time    If you need a refill on your cardiac medications before your next appointment, please call your pharmacy.   Call the [facility] office at [phone] if you have any questions, problems or concerns.

## 2015-07-16 NOTE — Telephone Encounter (Signed)
S/w pt per provider's request pt will be coming in at 11:15 am today for appointment instead of 11:45

## 2015-07-22 ENCOUNTER — Encounter: Payer: Self-pay | Admitting: Nurse Practitioner

## 2015-08-19 ENCOUNTER — Other Ambulatory Visit: Payer: Self-pay | Admitting: *Deleted

## 2015-08-19 ENCOUNTER — Encounter: Payer: Self-pay | Admitting: Nurse Practitioner

## 2015-08-19 ENCOUNTER — Telehealth: Payer: Self-pay | Admitting: *Deleted

## 2015-08-19 DIAGNOSIS — E785 Hyperlipidemia, unspecified: Secondary | ICD-10-CM

## 2015-08-19 MED ORDER — ROSUVASTATIN CALCIUM 5 MG PO TABS: 5.0000 mg | ORAL_TABLET | ORAL | 3 refills | Status: DC

## 2015-08-19 NOTE — Telephone Encounter (Signed)
S/w pt is aware I sent crestor in to requested lab.  (5mg  ) Monday, Wednesday, Friday.  Will monitor bp various times of day.  Pt will come in to lab on September 12 for lipid/lft.  Orders in, appointment made and linked.

## 2015-08-20 ENCOUNTER — Telehealth: Payer: Self-pay

## 2015-08-20 NOTE — Telephone Encounter (Signed)
Prior auth for Rosuvastatin 5 mg sent to [facility].

## 2015-10-01 ENCOUNTER — Other Ambulatory Visit: Payer: Medicare Other

## 2015-10-03 ENCOUNTER — Other Ambulatory Visit: Payer: Medicare Other

## 2015-10-07 ENCOUNTER — Ambulatory Visit: Payer: BLUE CROSS/BLUE SHIELD | Admitting: Cardiology

## 2015-10-17 ENCOUNTER — Telehealth: Payer: Self-pay | Admitting: Nurse Practitioner

## 2015-10-17 ENCOUNTER — Other Ambulatory Visit: Payer: Medicare Other

## 2015-10-17 DIAGNOSIS — Z79899 Other long term (current) drug therapy: Secondary | ICD-10-CM

## 2015-10-17 NOTE — Telephone Encounter (Signed)
Patient would like to come in tomorrow for lab work. Orders placed and lab scheduled. Patient thanks me for helping.

## 2015-10-17 NOTE — Telephone Encounter (Signed)
New message    Patient calling to reschedule lab work prior to upcoming appt .    Unable to rescheduled appt due to order has expiration

## 2015-10-17 NOTE — Telephone Encounter (Addendum)
Left pt a message to call back. Pt was scheduled for lipids and LFT's on 9/12 pt cancel appointment and 9/14 pt was no show.  Pt has appointment with Norma FredricksonLori Gerhardt on 07/15/16.

## 2015-10-18 ENCOUNTER — Other Ambulatory Visit: Payer: Medicare Other | Admitting: *Deleted

## 2015-10-18 DIAGNOSIS — Z79899 Other long term (current) drug therapy: Secondary | ICD-10-CM | POA: Diagnosis not present

## 2015-10-18 LAB — LIPID PANEL
Cholesterol: 236 mg/dL — ABNORMAL HIGH (ref 125–200)
HDL: 47 mg/dL (ref >=46)
LDL Cholesterol: 158 mg/dL — ABNORMAL HIGH (ref <130)
Total CHOL/HDL Ratio: 5 Ratio (ref <=5.0)
Triglycerides: 154 mg/dL — ABNORMAL HIGH (ref <150)
VLDL: 31 mg/dL — ABNORMAL HIGH (ref <30)

## 2015-10-18 LAB — COMPREHENSIVE METABOLIC PANEL
ALT: 16 U/L (ref 6–29)
AST: 24 U/L (ref 10–35)
Albumin: 4.7 g/dL (ref 3.6–5.1)
Alkaline Phosphatase: 77 U/L (ref 33–130)
BUN: 18 mg/dL (ref 7–25)
CO2: 17 mmol/L — ABNORMAL LOW (ref 20–31)
Calcium: 9.3 mg/dL (ref 8.6–10.4)
Chloride: 107 mmol/L (ref 98–110)
Creat: 0.81 mg/dL (ref 0.50–0.99)
Glucose, Bld: 97 mg/dL (ref 65–99)
Potassium: 4.8 mmol/L (ref 3.5–5.3)
Sodium: 139 mmol/L (ref 135–146)
Total Bilirubin: 0.5 mg/dL (ref 0.2–1.2)
Total Protein: 7.5 g/dL (ref 6.1–8.1)

## 2015-10-21 ENCOUNTER — Other Ambulatory Visit: Payer: Self-pay | Admitting: *Deleted

## 2015-10-21 DIAGNOSIS — E785 Hyperlipidemia, unspecified: Secondary | ICD-10-CM

## 2015-10-21 MED ORDER — ROSUVASTATIN CALCIUM 5 MG PO TABS: 5.0000 mg | ORAL_TABLET | ORAL | 6 refills | Status: DC

## 2016-01-01 ENCOUNTER — Encounter: Payer: Self-pay | Admitting: Nurse Practitioner

## 2016-01-01 ENCOUNTER — Ambulatory Visit (INDEPENDENT_AMBULATORY_CARE_PROVIDER_SITE_OTHER): Payer: Medicare Other | Admitting: Nurse Practitioner

## 2016-01-01 ENCOUNTER — Other Ambulatory Visit: Payer: Medicare Other

## 2016-01-01 VITALS — BP 142/82 | HR 84 | Temp 97.5°F | Ht 65.0 in | Wt 133.0 lb

## 2016-01-01 DIAGNOSIS — J020 Streptococcal pharyngitis: Secondary | ICD-10-CM

## 2016-01-01 LAB — POCT RAPID STREP A (OFFICE): RAPID STREP A SCREEN: POSITIVE — AB

## 2016-01-01 MED ORDER — AMOXICILLIN 875 MG PO TABS: 875.0000 mg | ORAL_TABLET | Freq: Two times a day (BID) | ORAL | 0 refills | Status: DC

## 2016-01-01 NOTE — Progress Notes (Signed)
Pre visit review using our clinic review tool, if applicable. No additional management support is needed unless otherwise documented below in the visit note. 

## 2016-01-01 NOTE — Patient Instructions (Signed)
Strep Throat Strep throat is a bacterial infection of the throat. Your health care provider may call the infection tonsillitis or pharyngitis, depending on whether there is swelling in the tonsils or at the back of the throat. Strep throat is most common during the cold months of the year in children who are 5-65 years of age, but it can happen during any season in people of any age. This infection is spread from person to person (contagious) through coughing, sneezing, or close contact. What are the causes? Strep throat is caused by the bacteria called Streptococcus pyogenes. What increases the risk? This condition is more likely to develop in:  People who spend time in crowded places where the infection can spread easily.  People who have close contact with someone who has strep throat.  What are the signs or symptoms? Symptoms of this condition include:  Fever or chills.  Redness, swelling, or pain in the tonsils or throat.  Pain or difficulty when swallowing.  White or yellow spots on the tonsils or throat.  Swollen, tender glands in the neck or under the jaw.  Red rash all over the body (rare).  How is this diagnosed? This condition is diagnosed by performing a rapid strep test or by taking a swab of your throat (throat culture test). Results from a rapid strep test are usually ready in a few minutes, but throat culture test results are available after one or two days. How is this treated? This condition is treated with antibiotic medicine. Follow these instructions at home: Medicines  Take over-the-counter and prescription medicines only as told by your health care provider.  Take your antibiotic as told by your health care provider. Do not stop taking the antibiotic even if you start to feel better.  Have family members who also have a sore throat or fever tested for strep throat. They may need antibiotics if they have the strep infection. Eating and drinking  Do not  share food, drinking cups, or personal items that could cause the infection to spread to other people.  If swallowing is difficult, try eating soft foods until your sore throat feels better.  Drink enough fluid to keep your urine clear or pale yellow. General instructions  Gargle with a salt-water mixture 3-4 times per day or as needed. To make a salt-water mixture, completely dissolve -1 tsp of salt in 1 cup of warm water.  Make sure that all household members wash their hands well.  Get plenty of rest.  Stay home from school or work until you have been taking antibiotics for 24 hours.  Keep all follow-up visits as told by your health care provider. This is important. Contact a health care provider if:  The glands in your neck continue to get bigger.  You develop a rash, cough, or earache.  You cough up a thick liquid that is green, yellow-brown, or bloody.  You have pain or discomfort that does not get better with medicine.  Your problems seem to be getting worse rather than better.  You have a fever. Get help right away if:  You have new symptoms, such as vomiting, severe headache, stiff or painful neck, chest pain, or shortness of breath.  You have severe throat pain, drooling, or changes in your voice.  You have swelling of the neck, or the skin on the neck becomes red and tender.  You have signs of dehydration, such as fatigue, dry mouth, and decreased urination.  You become increasingly sleepy, or   you cannot wake up completely.  Your joints become red or painful. This information is not intended to replace advice given to you by your health care provider. Make sure you discuss any questions you have with your health care provider. Document Released: 01/03/2000 Document Revised: 09/04/2015 Document Reviewed: 04/30/2014 Elsevier Interactive Patient Education  2017 Elsevier Inc.  

## 2016-01-01 NOTE — Progress Notes (Signed)
Subjective:  Patient ID: Dawn Perry, female    DOB: 1950-06-05  Age: 65 y.o. MRN: [id number]  CC: Sore Throat (sore throat for 4 days,ears painful when swollen. took motrin. )   Sore Throat   This is a new problem. The current episode started in the past 7 days. The problem has been gradually worsening. There has been no fever. The pain is severe. Associated symptoms include a hoarse voice, swollen glands and trouble swallowing. Pertinent negatives include no abdominal pain, congestion, coughing, diarrhea, drooling, ear discharge, headaches, plugged ear sensation, neck pain, shortness of breath, stridor or vomiting. She has had exposure to strep. She has had no exposure to mono. She has tried NSAIDs for the symptoms. The treatment provided no relief.    Outpatient Medications Prior to Visit  Medication Sig Dispense Refill  . Ibuprofen (MOTRIN PO) Take 600 mg by mouth as needed (moderate pain).    . rosuvastatin (CRESTOR) 5 MG tablet Take 1 tablet (5 mg total) by mouth 4 (four) times a week. Take Monday, Wednesday, Friday and [place] 36 tablet 6  . atorvastatin (LIPITOR) 40 MG tablet Take 1 tablet (40 mg total) by mouth daily. (Patient not taking: Reported on 01/01/2016) 90 tablet 3   No facility-administered medications prior to visit.     ROS See HPI  Objective:  BP (!) 142/82   Pulse 84   Temp 97.5 F (36.4 C)   Ht 5\' 5"  (1.651 m)   Wt 133 lb (60.3 kg)   SpO2 99%   BMI 22.13 kg/m   BP Readings from Last 3 Encounters:  01/01/16 (!) 142/82  07/16/15 (!) 150/82  04/17/14 132/84    Wt Readings from Last 3 Encounters:  01/01/16 133 lb (60.3 kg)  07/16/15 139 lb 8 oz (63.3 kg)  04/17/14 138 lb (62.6 kg)    Physical Exam  Constitutional: She is oriented to person, place, and time.  HENT:  Right Ear: Tympanic membrane, external ear and ear canal normal.  Left Ear: Tympanic membrane, external ear and ear canal normal.  Nose: No mucosal edema or rhinorrhea. Right  sinus exhibits no maxillary sinus tenderness and no frontal sinus tenderness. Left sinus exhibits no maxillary sinus tenderness and no frontal sinus tenderness.  Mouth/Throat: Uvula is midline. No trismus in the jaw. Oropharyngeal exudate and posterior oropharyngeal erythema present.  Eyes: No scleral icterus.  Neck: Normal range of motion. Neck supple.  Cardiovascular: Normal rate.   Pulmonary/Chest: Effort normal.  Musculoskeletal: She exhibits no edema.  Lymphadenopathy:    She has cervical adenopathy.  Neurological: She is alert and oriented to person, place, and time.  Skin: Skin is warm and dry. No rash noted.  Vitals reviewed.   Lab Results  Component Value Date   WBC 4.7 02/15/2013   HGB 13.7 02/15/2013   HCT 41.2 02/15/2013   PLT 255.0 02/15/2013   GLUCOSE 97 10/18/2015   CHOL 236 (H) 10/18/2015   TRIG 154 (H) 10/18/2015   HDL 47 10/18/2015   LDLDIRECT 132.0 02/15/2013   LDLCALC 158 (H) 10/18/2015   ALT 16 10/18/2015   AST 24 10/18/2015   NA 139 10/18/2015   K 4.8 10/18/2015   CL 107 10/18/2015   CREATININE 0.81 10/18/2015   BUN 18 10/18/2015   CO2 17 (L) 10/18/2015   TSH 1.48 03/26/2011    No results found.  Assessment & Plan:   Dawn Perry was seen today for sore throat.  Diagnoses and all orders for this visit:  Acute streptococcal pharyngitis -     POCT rapid strep A -     amoxicillin (AMOXIL) 875 MG tablet; Take 1 tablet (875 mg total) by mouth 2 (two) times daily.   I am having Dawn Perry start on amoxicillin. I am also having her maintain her atorvastatin, Ibuprofen (MOTRIN PO), and rosuvastatin.  Meds ordered this encounter  Medications  . amoxicillin (AMOXIL) 875 MG tablet    Sig: Take 1 tablet (875 mg total) by mouth 2 (two) times daily.    Dispense:  20 tablet    Refill:  0    Order Specific Question:   Supervising Provider    Answer:   Tresa GarterPLOTNIKOV, ALEKSEI V [[id number]]    Follow-up: Return if symptoms worsen or fail to improve.  Alysia Pennaharlotte  Ludwika Rodd, NP

## 2016-01-01 NOTE — Progress Notes (Signed)
Reviewed with patient in office. See office note

## 2016-01-02 ENCOUNTER — Other Ambulatory Visit: Payer: Medicare Other

## 2016-04-06 ENCOUNTER — Telehealth: Payer: Self-pay | Admitting: Internal Medicine

## 2016-04-06 DIAGNOSIS — H9190 Unspecified hearing loss, unspecified ear: Secondary | ICD-10-CM

## 2016-04-06 NOTE — Telephone Encounter (Signed)
Patient contacted and stated awareness 

## 2016-04-06 NOTE — Telephone Encounter (Signed)
Patient needs a referral for a hearing test [facility] Fax: [phone] Attn: Neysa Bonitohristy

## 2016-04-06 NOTE — Telephone Encounter (Signed)
Will place referral but would strongly recommend to schedule wellness visit with us since not seen since 2016.

## 2016-04-13 DIAGNOSIS — H903 Sensorineural hearing loss, bilateral: Secondary | ICD-10-CM | POA: Diagnosis not present

## 2016-06-17 ENCOUNTER — Telehealth: Payer: Self-pay | Admitting: Nurse Practitioner

## 2016-06-17 ENCOUNTER — Other Ambulatory Visit: Payer: Self-pay | Admitting: *Deleted

## 2016-06-17 DIAGNOSIS — E785 Hyperlipidemia, unspecified: Secondary | ICD-10-CM

## 2016-06-17 MED ORDER — ROSUVASTATIN CALCIUM 10 MG PO TABS: 10.0000 mg | ORAL_TABLET | Freq: Every day | ORAL | 3 refills | Status: DC

## 2016-06-17 NOTE — Telephone Encounter (Signed)
Mrs. Dawn Perry is wanting to speak with Dawn Perry about her medication . Please call

## 2016-06-17 NOTE — Telephone Encounter (Signed)
Agree 

## 2016-06-17 NOTE — Telephone Encounter (Signed)
S/w pt, took a break from Crestor (5 mg ) 4 days a week, a while ago.  Stated felt a little better but not much.  Stated younger brother just had 6 bypass's,  pt decided to go back on and increase of Crestor (10 mg ) daily and is feeling ok. Needs refill and wanted to come in for labs.  Was going to make pt appointment but is coming in on June 25 to see Lawson FiscalLori and stated pt can get fasting labs the day of ov. Will send in Crestor (10 mg ) daily to [facility] on [address] if Lawson FiscalLori is ok with this.  Will send to Lawson FiscalLori to advise. Will call back pt if Lawson FiscalLori does not agree.

## 2016-07-13 ENCOUNTER — Ambulatory Visit: Payer: 59 | Admitting: Nurse Practitioner

## 2016-07-28 ENCOUNTER — Encounter: Payer: Self-pay | Admitting: Nurse Practitioner

## 2016-07-28 ENCOUNTER — Ambulatory Visit (INDEPENDENT_AMBULATORY_CARE_PROVIDER_SITE_OTHER): Payer: Medicare Other | Admitting: Nurse Practitioner

## 2016-07-28 VITALS — BP 136/78 | HR 65 | Ht 65.0 in | Wt 136.8 lb

## 2016-07-28 DIAGNOSIS — E7800 Pure hypercholesterolemia, unspecified: Secondary | ICD-10-CM

## 2016-07-28 DIAGNOSIS — I447 Left bundle-branch block, unspecified: Secondary | ICD-10-CM

## 2016-07-28 LAB — BASIC METABOLIC PANEL
BUN/Creatinine Ratio: 15 (ref 12–28)
BUN: 12 mg/dL (ref 8–27)
CO2: 23 mmol/L (ref 20–29)
Calcium: 9.4 mg/dL (ref 8.7–10.3)
Chloride: 104 mmol/L (ref 96–106)
Creatinine, Ser: 0.8 mg/dL (ref 0.57–1.00)
GFR calc Af Amer: 89 mL/min/{1.73_m2} (ref >59)
GFR calc non Af Amer: 77 mL/min/{1.73_m2} (ref >59)
Glucose: 93 mg/dL (ref 65–99)
Potassium: 4.9 mmol/L (ref 3.5–5.2)
Sodium: 141 mmol/L (ref 134–144)

## 2016-07-28 LAB — HEPATIC FUNCTION PANEL
ALT: 23 IU/L (ref 0–32)
AST: 22 IU/L (ref 0–40)
Albumin: 4.6 g/dL (ref 3.6–4.8)
Alkaline Phosphatase: 89 IU/L (ref 39–117)
Bilirubin Total: 0.5 mg/dL (ref 0.0–1.2)
Bilirubin, Direct: 0.12 mg/dL (ref 0.00–0.40)
Total Protein: 6.7 g/dL (ref 6.0–8.5)

## 2016-07-28 LAB — LIPID PANEL
Chol/HDL Ratio: 4 ratio (ref 0.0–4.4)
Cholesterol, Total: 204 mg/dL — ABNORMAL HIGH (ref 100–199)
HDL: 51 mg/dL (ref >39)
LDL Calculated: 98 mg/dL (ref 0–99)
Triglycerides: 273 mg/dL — ABNORMAL HIGH (ref 0–149)
VLDL Cholesterol Cal: 55 mg/dL — ABNORMAL HIGH (ref 5–40)

## 2016-07-28 NOTE — Patient Instructions (Addendum)
We will be checking the following labs today - BMET, Lipid and HPF   Medication Instructions:    Continue with your current medicines.     Testing/Procedures To Be Arranged:  N/A  Follow-Up:   See me in one year    Other Special Instructions:   Exercise more regularly  Keep a check on your BP for me    If you need a refill on your cardiac medications before your next appointment, please call your pharmacy.   Call the [facility] office at [phone] if you have any questions, problems or concerns.

## 2016-07-28 NOTE — Progress Notes (Signed)
CARDIOLOGY OFFICE NOTE  Date:  07/28/2016    Dawn Perry Date of Birth: 25-Dec-1950 Medical Record #[id number]  PCP:  Myrlene Brokerrawford, Elizabeth A, MD  Cardiologist:  Tyrone SageGerhardt    Chief Complaint  Patient presents with  . Cardiac Valve Problem    One year check - seen for Dr. Shirlee LatchMcLean    History of Present Illness: Dawn Perry is a 66 y.o. female who presents today for a one year check. Seen for Dr. Shirlee LatchMcLean but primarily follows with me. Previously seen by Dr. Deborah Chalkennant.   Remote negative Myoview in 2004 when her LBBB was noted. She has LBBB on EKG with echo from 2002 showing septal lateral dyssynchrony with a normal EF and mild MR, HLD, and remote scarlet fever. Her last echo from 2012 showed an EF 55% and mild MR. Tends to get vagal with blood draws.   Last seen here about a year ago - was doing well. Biggest issue was arthritic complaints.   Comes back today. Here alone. Doing well. Able to tolerate low dose Crestor. Her brother who is younger has had multiple blockages recently noted - this has frightened her so she has increased her dose and is trying to take every day. She does remain active. Will get a little winded if she walks fast and feels like this is more a reflection of not being as active as she should be. No chest pain. BP ok at home.   Past Medical History:  Diagnosis Date  . GERD (gastroesophageal reflux disease)    B hands  . H/O scarlet fever    age 66  . History of chicken pox   . Hyperlipidemia   . LBBB (left bundle branch block)   . Osteoarthritis     Past Surgical History:  Procedure Laterality Date  . Broke Left forearm    . Right shoulder tear  1990     Medications: Current Meds  Medication Sig  . Ibuprofen (MOTRIN PO) Take 600 mg by mouth as needed (moderate pain).  . rosuvastatin (CRESTOR) 10 MG tablet Take 1 tablet (10 mg total) by mouth daily.     Allergies: No Known Allergies  Social History: The patient  reports that she  has never smoked. She has never used smokeless tobacco. She reports that she drinks alcohol. She reports that she does not use drugs.   Family History: The patient's family history includes Arthritis in her father and mother; Heart attack (age of onset: 7174) in her father; Hyperlipidemia in her mother; Other in her mother; Parkinsonism in her other; Stroke in her mother.   Review of Systems: Please see the history of present illness.   Otherwise, the review of systems is positive for none.   All other systems are reviewed and negative.   Physical Exam: VS:  BP 136/78 (BP Location: Left Arm, Patient Position: Sitting, Cuff Size: Normal)   Pulse 65   Ht 5\' 5"  (1.651 m)   Wt 136 lb 12.8 oz (62.1 kg)   BMI 22.76 kg/m  .  BMI Body mass index is 22.76 kg/m.  Wt Readings from Last 3 Encounters:  07/28/16 136 lb 12.8 oz (62.1 kg)  01/01/16 133 lb (60.3 kg)  07/16/15 139 lb 8 oz (63.3 kg)    General: Pleasant. Well developed, well nourished and in no acute distress.   HEENT: Normal.  Neck: Supple, no JVD, carotid bruits, or masses noted.  Cardiac: Regular rate and rhythm. No murmurs, rubs, or  gallops. No edema.  Respiratory:  Lungs are clear to auscultation bilaterally with normal work of breathing.  GI: Soft and nontender.  MS: No deformity or atrophy. Gait and ROM intact.  Skin: Warm and dry. Color is normal.  Neuro:  Strength and sensation are intact and no gross focal deficits noted.  Psych: Alert, appropriate and with normal affect.   LABORATORY DATA:  EKG:  EKG is ordered today. This demonstrates NSR with LBBB.  Lab Results  Component Value Date   WBC 4.7 02/15/2013   HGB 13.7 02/15/2013   HCT 41.2 02/15/2013   PLT 255.0 02/15/2013   GLUCOSE 97 10/18/2015   CHOL 236 (H) 10/18/2015   TRIG 154 (H) 10/18/2015   HDL 47 10/18/2015   LDLDIRECT 132.0 02/15/2013   LDLCALC 158 (H) 10/18/2015   ALT 16 10/18/2015   AST 24 10/18/2015   NA 139 10/18/2015   K 4.8 10/18/2015   CL  107 10/18/2015   CREATININE 0.81 10/18/2015   BUN 18 10/18/2015   CO2 17 (L) 10/18/2015   TSH 1.48 03/26/2011     BNP (last 3 results) No results for input(s): BNP in the last 8760 hours.  ProBNP (last 3 results) No results for input(s): PROBNP in the last 8760 hours.   Other Studies Reviewed Today:   Assessment/Plan: 1. LBBB with associated dyssynchrony but with a normal EF noted by echo in 2012 - she is totally asymptomatic.   2. HLD - now on Crestor - lab today  3. Prior elevated BP - BP ok at home and ok here today - she will continue to monitor  4. +FH for CAD - needs to continue with CV risk factor modification. She is willing to try and be more active - if she continues to be short of breath - then proceed with repeating her Myoview.   Current medicines are reviewed with the patient today.  The patient does not have concerns regarding medicines other than what has been noted above.  The following changes have been made:  See above.  Labs/ tests ordered today include:    Orders Placed This Encounter  Procedures  . Basic metabolic panel  . Hepatic function panel  . Lipid panel  . EKG 12-Lead     Disposition:   FU with me in 1 year tentatively.   Patient is agreeable to this plan and will call if any problems develop in the interim.   SignedNorma Fredrickson, NP  07/28/2016 8:57 AM  [facility] [address] [place], [place]  [address] Phone: [phone] Fax: [phone]

## 2016-10-12 ENCOUNTER — Encounter: Payer: Self-pay | Admitting: Nurse Practitioner

## 2017-02-09 ENCOUNTER — Encounter: Payer: Self-pay | Admitting: *Deleted

## 2017-03-17 DIAGNOSIS — M5431 Sciatica, right side: Secondary | ICD-10-CM | POA: Diagnosis not present

## 2017-03-17 DIAGNOSIS — M9903 Segmental and somatic dysfunction of lumbar region: Secondary | ICD-10-CM | POA: Diagnosis not present

## 2017-03-18 DIAGNOSIS — M9903 Segmental and somatic dysfunction of lumbar region: Secondary | ICD-10-CM | POA: Diagnosis not present

## 2017-03-18 DIAGNOSIS — M5431 Sciatica, right side: Secondary | ICD-10-CM | POA: Diagnosis not present

## 2017-03-22 DIAGNOSIS — M5431 Sciatica, right side: Secondary | ICD-10-CM | POA: Diagnosis not present

## 2017-03-22 DIAGNOSIS — M9903 Segmental and somatic dysfunction of lumbar region: Secondary | ICD-10-CM | POA: Diagnosis not present

## 2017-03-23 DIAGNOSIS — H43393 Other vitreous opacities, bilateral: Secondary | ICD-10-CM | POA: Diagnosis not present

## 2017-03-23 DIAGNOSIS — H35372 Puckering of macula, left eye: Secondary | ICD-10-CM | POA: Diagnosis not present

## 2017-03-23 DIAGNOSIS — H35363 Drusen (degenerative) of macula, bilateral: Secondary | ICD-10-CM | POA: Diagnosis not present

## 2017-03-23 DIAGNOSIS — H40013 Open angle with borderline findings, low risk, bilateral: Secondary | ICD-10-CM | POA: Diagnosis not present

## 2017-03-24 DIAGNOSIS — M9903 Segmental and somatic dysfunction of lumbar region: Secondary | ICD-10-CM | POA: Diagnosis not present

## 2017-03-24 DIAGNOSIS — M5431 Sciatica, right side: Secondary | ICD-10-CM | POA: Diagnosis not present

## 2017-03-25 DIAGNOSIS — M5431 Sciatica, right side: Secondary | ICD-10-CM | POA: Diagnosis not present

## 2017-03-25 DIAGNOSIS — M9903 Segmental and somatic dysfunction of lumbar region: Secondary | ICD-10-CM | POA: Diagnosis not present

## 2017-03-29 DIAGNOSIS — M9903 Segmental and somatic dysfunction of lumbar region: Secondary | ICD-10-CM | POA: Diagnosis not present

## 2017-03-29 DIAGNOSIS — M5431 Sciatica, right side: Secondary | ICD-10-CM | POA: Diagnosis not present

## 2017-03-31 DIAGNOSIS — M9903 Segmental and somatic dysfunction of lumbar region: Secondary | ICD-10-CM | POA: Diagnosis not present

## 2017-03-31 DIAGNOSIS — M5431 Sciatica, right side: Secondary | ICD-10-CM | POA: Diagnosis not present

## 2017-07-28 ENCOUNTER — Ambulatory Visit: Payer: Medicare Other | Admitting: Nurse Practitioner

## 2017-07-29 DIAGNOSIS — J370 Chronic laryngitis: Secondary | ICD-10-CM | POA: Diagnosis not present

## 2017-07-29 DIAGNOSIS — J320 Chronic maxillary sinusitis: Secondary | ICD-10-CM | POA: Diagnosis not present

## 2017-07-29 DIAGNOSIS — J322 Chronic ethmoidal sinusitis: Secondary | ICD-10-CM | POA: Diagnosis not present

## 2017-07-29 DIAGNOSIS — J301 Allergic rhinitis due to pollen: Secondary | ICD-10-CM | POA: Diagnosis not present

## 2017-07-29 DIAGNOSIS — H6522 Chronic serous otitis media, left ear: Secondary | ICD-10-CM | POA: Diagnosis not present

## 2017-07-29 DIAGNOSIS — H902 Conductive hearing loss, unspecified: Secondary | ICD-10-CM | POA: Diagnosis not present

## 2017-08-02 ENCOUNTER — Ambulatory Visit: Payer: Medicare Other | Admitting: Nurse Practitioner

## 2017-08-05 DIAGNOSIS — J320 Chronic maxillary sinusitis: Secondary | ICD-10-CM | POA: Diagnosis not present

## 2017-08-05 DIAGNOSIS — J040 Acute laryngitis: Secondary | ICD-10-CM | POA: Diagnosis not present

## 2017-08-05 DIAGNOSIS — J322 Chronic ethmoidal sinusitis: Secondary | ICD-10-CM | POA: Diagnosis not present

## 2017-09-07 ENCOUNTER — Encounter: Payer: Self-pay | Admitting: Family Medicine

## 2017-09-07 ENCOUNTER — Ambulatory Visit (INDEPENDENT_AMBULATORY_CARE_PROVIDER_SITE_OTHER): Payer: Medicare Other | Admitting: Family Medicine

## 2017-09-07 VITALS — BP 128/74 | HR 64 | Temp 98.0°F | Ht 64.0 in | Wt 136.2 lb

## 2017-09-07 DIAGNOSIS — M19049 Primary osteoarthritis, unspecified hand: Secondary | ICD-10-CM

## 2017-09-07 DIAGNOSIS — E7800 Pure hypercholesterolemia, unspecified: Secondary | ICD-10-CM | POA: Diagnosis not present

## 2017-09-07 DIAGNOSIS — Z1211 Encounter for screening for malignant neoplasm of colon: Secondary | ICD-10-CM | POA: Diagnosis not present

## 2017-09-07 NOTE — Assessment & Plan Note (Addendum)
Stable.  Continue ibuprofen as needed. 

## 2017-09-07 NOTE — Assessment & Plan Note (Signed)
Stable.  Continue Crestor.  Check lipid panel with next blood draw.

## 2017-09-07 NOTE — Progress Notes (Signed)
   Subjective:  Dawn Perry is a 67 y.o. female who presents today with a chief complaint of HLD and to transfer care.   HPI:  HLD, chronic problem Currently on crestor 10mg  daily. Tolerating well without side effects.   Osteoarthritis Takes ibuprofen as needed.  ROS: No chest pain or shortness of breath.    Objective:  Physical Exam: BP 128/74 (BP Location: Left Arm, Patient Position: Sitting, Cuff Size: Normal)   Pulse 64   Temp 98 F (36.7 C) (Oral)   Ht 5\' 4"  (1.626 m)   Wt 136 lb 3.2 oz (61.8 kg)   SpO2 97%   BMI 23.38 kg/m   Gen: NAD, resting comfortably CV: RRR with no murmurs appreciated Pulm: NWOB, CTAB with no crackles, wheezes, or rhonchi GI: Normal bowel sounds present. Soft, Nontender, Nondistended. MSK: No edema, cyanosis, or clubbing noted Skin: Warm, dry Neuro: Grossly normal, moves all extremities Psych: Normal affect and thought content  Assessment/Plan:  Hyperlipidemia Stable.  Continue Crestor.  Check lipid panel with next blood draw.  Osteoarthritis of hand Stable.  Continue ibuprofen as needed.  Preventative Healthcare Patient was instructed to return soon for CPE.  Will order Cologuard today. Health Maintenance Due  Topic Date Due  . Hepatitis C Screening  [birth year]  . COLONOSCOPY  03/16/2000  . MAMMOGRAM  09/07/2013  . PNA vac Low Risk Adult (1 of 2 - PCV13) 03/17/2015  . INFLUENZA VACCINE  08/19/2017      Katina Degreealeb M. Jimmey RalphParker, MD 09/07/2017 9:59 AM

## 2017-09-07 NOTE — Patient Instructions (Signed)
It was very nice to see you today!  No medication changes today.  We will set you up to have a cologard.  Come back soon for your annual wellness visit.  Come back to see me in 1 year, or sooner as needed.  Take care, Dr Jimmey RalphParker

## 2017-10-05 DIAGNOSIS — Z1211 Encounter for screening for malignant neoplasm of colon: Secondary | ICD-10-CM | POA: Diagnosis not present

## 2017-10-05 LAB — COLOGUARD: Cologuard: NEGATIVE

## 2017-10-13 ENCOUNTER — Ambulatory Visit (INDEPENDENT_AMBULATORY_CARE_PROVIDER_SITE_OTHER): Payer: Medicare Other | Admitting: Nurse Practitioner

## 2017-10-13 ENCOUNTER — Encounter: Payer: Self-pay | Admitting: Nurse Practitioner

## 2017-10-13 VITALS — BP 132/90 | HR 67 | Ht 65.0 in | Wt 138.8 lb

## 2017-10-13 DIAGNOSIS — E785 Hyperlipidemia, unspecified: Secondary | ICD-10-CM | POA: Diagnosis not present

## 2017-10-13 DIAGNOSIS — I447 Left bundle-branch block, unspecified: Secondary | ICD-10-CM | POA: Diagnosis not present

## 2017-10-13 DIAGNOSIS — Z1231 Encounter for screening mammogram for malignant neoplasm of breast: Secondary | ICD-10-CM | POA: Diagnosis not present

## 2017-10-13 DIAGNOSIS — R0789 Other chest pain: Secondary | ICD-10-CM

## 2017-10-13 LAB — HM MAMMOGRAPHY

## 2017-10-13 MED ORDER — ROSUVASTATIN CALCIUM 10 MG PO TABS: 10.0000 mg | ORAL_TABLET | Freq: Every day | ORAL | 3 refills | Status: DC

## 2017-10-13 NOTE — Patient Instructions (Addendum)
We will be checking the following labs today - None  BMET, CBC, TSH HPF and lipids later this week or next   Medication Instructions:    Continue with your current medicines.   I have refilled the Crestor today    Testing/Procedures To Be Arranged:  Coronary CT  Follow-Up:   See me in one year.      Other Special Instructions:   CORONARY CT INSTRUCTIONS: Please arrive at the [facility] main entrance of [facility] at xx:xx AM (30-45 minutes prior to test start time)  [facility] [address] [place], [place] [address] [phone]  Proceed to the [facility] Radiology Department (First Floor).  Please follow these instructions carefully (unless otherwise directed):    On the Night Before the Test: Be sure to Drink plenty of water. Do not consume any caffeinated/decaffeinated beverages or chocolate 12 hours prior to your test. Do not take any antihistamines 12 hours prior to your test.   On the Day of the Test: Drink plenty of water. Do not drink any water within one hour of the test. Do not eat any food 4 hours prior to the test. You may take your regular medications prior to the test.  After the Test: Drink plenty of water. After receiving IV contrast, you may experience a mild flushed feeling. This is normal. On occasion, you may experience a mild rash up to 24 hours after the test. This is not dangerous. If this occurs, you can take Benadryl 25 mg and increase your fluid intake. If you experience trouble breathing, this can be serious. If it is severe call [phone] IMMEDIATELY. If it is mild, please call our office.       If you need a refill on your cardiac medications before your next appointment, please call your pharmacy.   Call the [facility] office at [phone] if you have any questions, problems or concerns.

## 2017-10-13 NOTE — Progress Notes (Signed)
CARDIOLOGY OFFICE NOTE  Date:  10/13/2017    Jamse Mead Date of Birth: 12/20/1950 Medical Record #[id number]  PCP:  Ardith Dark, MD  Cardiologist:  Emily Filbert    Chief Complaint  Patient presents with  . Follow-up    1 year check - seen for Dr. Shirlee Latch    History of Present Illness: Dawn Perry is a 67 y.o. female who presents today for a one year check. Seen for Dr. Shirlee Latch but primarily follows with me. Previously seen by Dr. Deborah Chalk.   Remote negative Myoview in 2004 when her LBBB was noted. She has LBBB on EKG with echo from 2002 showing septal lateral dyssynchrony with a normal EF and mild MR, HLD, and remote scarlet fever. Her last echo from 2012 showed an EF 55% and mild MR. Tends to get vagal with blood draws.   I saw her last in July of 2018 - she was doing well from our standpoint - more limited by arthritic issues. She has been able to tolerate low dose statin therapy.   Comes back today. Here alone. She continues to do well. No chest pain. Breathing is good. Needs refills for her Crestor - she is able to take the 10 mg for several weeks at a time - then has to "take a break" for a few weeks and will then restart. She has done this for some time. She was "violently sick" with some type flu/viral/GI/URI illness back in June when I was suppose to see her. This has all resolved. Has gotten lax with her CV risk factor modification. She notes that she has had some "sharp" pain in her chest - this has been somewhat chronic but worrisome. No exertional symptoms. She has some fatigue may be out of proportion but she is also not exercising.  Some DOE - this has been chronic. She is quite worried about the status of her heart. Younger brother had CABG a few years ago. She has this LBBB and HLD.   Past Medical History:  Diagnosis Date  . GERD (gastroesophageal reflux disease)    B hands  . H/O scarlet fever    age 72  . History of chicken pox   .  Hyperlipidemia   . LBBB (left bundle branch block)   . Osteoarthritis     Past Surgical History:  Procedure Laterality Date  . Broke Left forearm    . Right shoulder tear  1990     Medications: Current Meds  Medication Sig  . Coenzyme Q10 (CO Q 10 PO) Take by mouth.  . Ibuprofen (MOTRIN PO) Take 600 mg by mouth as needed (moderate pain).  . rosuvastatin (CRESTOR) 10 MG tablet Take 1 tablet (10 mg total) by mouth daily.  . [DISCONTINUED] rosuvastatin (CRESTOR) 10 MG tablet Take 10 mg by mouth daily.   Allergies: No Known Allergies  Social History: The patient  reports that she has never smoked. She has never used smokeless tobacco. She reports that she drank alcohol. She reports that she does not use drugs.   Family History: The patient's family history includes Arthritis in her father and mother; Coronary artery disease in her brother; Hyperlipidemia in her mother; Other in her mother; Parkinson's disease in her father; Parkinsonism in her other; Stroke in her mother.   Review of Systems: Please see the history of present illness.   Otherwise, the review of systems is positive for none.   All other systems are reviewed  and negative.   Physical Exam: VS:  BP 132/90 (BP Location: Left Arm, Patient Position: Sitting, Cuff Size: Normal)   Pulse 67   Ht 5\' 5"  (1.651 m)   Wt 138 lb 12.8 oz (63 kg)   BMI 23.10 kg/m  .  BMI Body mass index is 23.1 kg/m.  Wt Readings from Last 3 Encounters:  10/13/17 138 lb 12.8 oz (63 kg)  09/07/17 136 lb 3.2 oz (61.8 kg)  07/28/16 136 lb 12.8 oz (62.1 kg)    General: Pleasant. Well developed, well nourished and in no acute distress.   HEENT: Normal.  Neck: Supple, no JVD, carotid bruits, or masses noted.  Cardiac: Regular rate and rhythm. No murmur that I can appreciate today. No edema.  Respiratory:  Lungs are clear to auscultation bilaterally with normal work of breathing.  GI: Soft and nontender.  MS: No deformity or atrophy. Gait and  ROM intact.  Skin: Warm and dry. Color is normal.  Neuro:  Strength and sensation are intact and no gross focal deficits noted.  Psych: Alert, appropriate and with normal affect.   LABORATORY DATA:  EKG:  EKG is ordered today. This demonstrates NSR with LBBB.  Lab Results  Component Value Date   WBC 4.7 02/15/2013   HGB 13.7 02/15/2013   HCT 41.2 02/15/2013   PLT 255.0 02/15/2013   GLUCOSE 93 07/28/2016   CHOL 204 (H) 07/28/2016   TRIG 273 (H) 07/28/2016   HDL 51 07/28/2016   LDLDIRECT 132.0 02/15/2013   LDLCALC 98 07/28/2016   ALT 23 07/28/2016   AST 22 07/28/2016   NA 141 07/28/2016   K 4.9 07/28/2016   CL 104 07/28/2016   CREATININE 0.80 07/28/2016   BUN 12 07/28/2016   CO2 23 07/28/2016   TSH 1.48 03/26/2011     BNP (last 3 results) No results for input(s): BNP in the last 8760 hours.  ProBNP (last 3 results) No results for input(s): PROBNP in the last 8760 hours.   Other Studies Reviewed Today:   Assessment/Plan:  1. Chest pain - atypical - but with several risk factors along with her chronic LBBB - will arrange for coronary CT. Further disposition to follow.   2. LBBB - chronic   3. HLD - on statin - has to take occasional breaks in order to tolerate. Needs labs - will arrange when fasting.   4. +FH for CAD - needs CV risk factor modification - discussed at length. Arranging coronary CT as well.   Current medicines are reviewed with the patient today.  The patient does not have concerns regarding medicines other than what has been noted above.  The following changes have been made:  See above.  Labs/ tests ordered today include:    Orders Placed This Encounter  Procedures  . CT CORONARY MORPH W/CTA COR W/SCORE W/CA W/CM &/OR WO/CM  . CT CORONARY FRACTIONAL FLOW RESERVE DATA PREP  . CT CORONARY FRACTIONAL FLOW RESERVE FLUID ANALYSIS  . Basic metabolic panel  . CBC  . Hepatic function panel  . TSH  . Lipid panel  . EKG 12-Lead      Disposition:   FU with me tentatively in 1 year.   Patient is agreeable to this plan and will call if any problems develop in the interim.   SignedNorma Fredrickson, NP  10/13/2017 2:30 PM  [facility] [address] [place], [place]  [address] Phone: [phone] Fax: [phone]

## 2017-10-19 ENCOUNTER — Other Ambulatory Visit: Payer: Medicare Other

## 2017-10-21 ENCOUNTER — Other Ambulatory Visit: Payer: Medicare Other | Admitting: *Deleted

## 2017-10-21 DIAGNOSIS — E785 Hyperlipidemia, unspecified: Secondary | ICD-10-CM

## 2017-10-21 DIAGNOSIS — R0789 Other chest pain: Secondary | ICD-10-CM

## 2017-10-21 DIAGNOSIS — I447 Left bundle-branch block, unspecified: Secondary | ICD-10-CM

## 2017-10-21 LAB — BASIC METABOLIC PANEL
BUN/Creatinine Ratio: 25 (ref 12–28)
BUN: 21 mg/dL (ref 8–27)
CO2: 21 mmol/L (ref 20–29)
Calcium: 9.8 mg/dL (ref 8.7–10.3)
Chloride: 103 mmol/L (ref 96–106)
Creatinine, Ser: 0.84 mg/dL (ref 0.57–1.00)
GFR calc Af Amer: 83 mL/min/{1.73_m2} (ref >59)
GFR calc non Af Amer: 72 mL/min/{1.73_m2} (ref >59)
Glucose: 93 mg/dL (ref 65–99)
Potassium: 4.9 mmol/L (ref 3.5–5.2)
Sodium: 142 mmol/L (ref 134–144)

## 2017-10-21 LAB — HEPATIC FUNCTION PANEL
ALT: 18 IU/L (ref 0–32)
AST: 16 IU/L (ref 0–40)
Albumin: 4.6 g/dL (ref 3.6–4.8)
Alkaline Phosphatase: 88 IU/L (ref 39–117)
Bilirubin Total: 0.6 mg/dL (ref 0.0–1.2)
Bilirubin, Direct: 0.14 mg/dL (ref 0.00–0.40)
Total Protein: 7.1 g/dL (ref 6.0–8.5)

## 2017-10-21 LAB — LIPID PANEL
Chol/HDL Ratio: 3.1 ratio (ref 0.0–4.4)
Cholesterol, Total: 162 mg/dL (ref 100–199)
HDL: 52 mg/dL (ref >39)
LDL Calculated: 85 mg/dL (ref 0–99)
Triglycerides: 127 mg/dL (ref 0–149)
VLDL Cholesterol Cal: 25 mg/dL (ref 5–40)

## 2017-10-21 LAB — CBC
Hematocrit: 41.2 % (ref 34.0–46.6)
Hemoglobin: 14.4 g/dL (ref 11.1–15.9)
MCH: 31.2 pg (ref 26.6–33.0)
MCHC: 35 g/dL (ref 31.5–35.7)
MCV: 89 fL (ref 79–97)
Platelets: 238 10*3/uL (ref 150–450)
RBC: 4.62 x10E6/uL (ref 3.77–5.28)
RDW: 13.2 % (ref 12.3–15.4)
WBC: 5.6 10*3/uL (ref 3.4–10.8)

## 2017-10-21 LAB — TSH: TSH: 2.62 u[IU]/mL (ref 0.450–4.500)

## 2017-10-26 ENCOUNTER — Telehealth: Payer: Self-pay | Admitting: Family Medicine

## 2017-10-26 ENCOUNTER — Telehealth: Payer: Self-pay | Admitting: *Deleted

## 2017-10-26 DIAGNOSIS — E785 Hyperlipidemia, unspecified: Secondary | ICD-10-CM

## 2017-10-26 DIAGNOSIS — I447 Left bundle-branch block, unspecified: Secondary | ICD-10-CM

## 2017-10-26 NOTE — Telephone Encounter (Signed)
Pt is aware cost $150 will be out pocket. Pt states she does not want to schedule the Coronary CT yet, just the Calcium Score. I will order the Calcium Score and have scheduling call her with an appt.

## 2017-10-26 NOTE — Addendum Note (Signed)
Addended by: Tarri Fuller on: 10/26/2017 03:51 PM   Modules accepted: Orders

## 2017-10-26 NOTE — Telephone Encounter (Signed)
Pt notified of lab results by phone with verbal understanding. Pt states Dawn Fredrickson, NP had wanted her to have a CT. See phone note for further information.   Pt states She would like for me to pass a message on to Dawn Fredrickson, NP that she would like to just do the CT Calcium Score first and see what that shows. I assured the pt I will let her know and we will get the test ordered for her and we will call her with an appt, as long as Dawn Fredrickson, NP is on board. Pt thanked me for the call today.

## 2017-10-26 NOTE — Telephone Encounter (Signed)
Patient notified

## 2017-10-26 NOTE — Telephone Encounter (Signed)
Please let pt know her cologuard was negative.  We should recheck in 3 years.  Katina Degree. Jimmey Ralph, MD 10/26/2017 12:20 PM

## 2017-10-26 NOTE — Telephone Encounter (Signed)
Noted.   Thanks  Olis Viverette

## 2017-10-26 NOTE — Telephone Encounter (Signed)
I s/w pt and advised her Norma Fredrickson, NP was ok with just ordering the CT Calcium Score. Advised pt order has been placed and that our scheduling team will call her to schedule CT. Pt is aware the $150 is out of pocket. Pt thanked me for the call and my help.

## 2017-10-26 NOTE — Telephone Encounter (Signed)
-----   Message from Rosalio Macadamia, NP sent at 10/22/2017  8:22 AM EDT ----- Labs look good - no change with current regimen.

## 2017-10-26 NOTE — Telephone Encounter (Signed)
Ok to do - this will be an out of pocket expense. I do not see that the Coronary CT has been scheduled yet.

## 2017-11-10 ENCOUNTER — Ambulatory Visit (INDEPENDENT_AMBULATORY_CARE_PROVIDER_SITE_OTHER)
Admission: RE | Admit: 2017-11-10 | Discharge: 2017-11-10 | Disposition: A | Discharge status: Home / Self Care | Payer: Self-pay | Source: Ambulatory Visit | Attending: Nurse Practitioner | Admitting: Nurse Practitioner

## 2017-11-10 ENCOUNTER — Ambulatory Visit (INDEPENDENT_AMBULATORY_CARE_PROVIDER_SITE_OTHER): Payer: Medicare Other

## 2017-11-10 DIAGNOSIS — Z23 Encounter for immunization: Secondary | ICD-10-CM | POA: Diagnosis not present

## 2017-11-10 DIAGNOSIS — I447 Left bundle-branch block, unspecified: Secondary | ICD-10-CM

## 2017-11-10 DIAGNOSIS — E785 Hyperlipidemia, unspecified: Secondary | ICD-10-CM

## 2017-11-16 ENCOUNTER — Other Ambulatory Visit: Payer: Self-pay | Admitting: *Deleted

## 2017-11-16 DIAGNOSIS — I447 Left bundle-branch block, unspecified: Secondary | ICD-10-CM

## 2017-11-16 DIAGNOSIS — E7800 Pure hypercholesterolemia, unspecified: Secondary | ICD-10-CM

## 2017-11-16 MED ORDER — ASPIRIN EC 81 MG PO TBEC: 81.0000 mg | DELAYED_RELEASE_TABLET | Freq: Every day | ORAL | 3 refills | Status: DC

## 2017-11-16 NOTE — Progress Notes (Signed)
error 

## 2017-11-17 ENCOUNTER — Other Ambulatory Visit: Payer: Self-pay | Admitting: *Deleted

## 2017-11-17 DIAGNOSIS — E7800 Pure hypercholesterolemia, unspecified: Secondary | ICD-10-CM

## 2017-11-17 DIAGNOSIS — I447 Left bundle-branch block, unspecified: Secondary | ICD-10-CM

## 2017-11-17 DIAGNOSIS — R079 Chest pain, unspecified: Secondary | ICD-10-CM

## 2017-11-29 ENCOUNTER — Telehealth: Payer: Self-pay | Admitting: *Deleted

## 2017-11-29 ENCOUNTER — Ambulatory Visit (HOSPITAL_COMMUNITY)
Admission: RE | Admit: 2017-11-29 | Discharge: 2017-11-29 | Disposition: A | Discharge status: Home / Self Care | Payer: Medicare Other | Source: Ambulatory Visit | Attending: Nurse Practitioner | Admitting: Nurse Practitioner

## 2017-11-29 DIAGNOSIS — I2510 Atherosclerotic heart disease of native coronary artery without angina pectoris: Secondary | ICD-10-CM | POA: Diagnosis not present

## 2017-11-29 DIAGNOSIS — I447 Left bundle-branch block, unspecified: Secondary | ICD-10-CM | POA: Diagnosis not present

## 2017-11-29 DIAGNOSIS — R918 Other nonspecific abnormal finding of lung field: Secondary | ICD-10-CM | POA: Diagnosis not present

## 2017-11-29 DIAGNOSIS — E7800 Pure hypercholesterolemia, unspecified: Secondary | ICD-10-CM | POA: Diagnosis not present

## 2017-11-29 DIAGNOSIS — R079 Chest pain, unspecified: Secondary | ICD-10-CM | POA: Insufficient documentation

## 2017-11-29 MED ORDER — METOPROLOL TARTRATE 5 MG/5ML IV SOLN
5.0000 mg | INTRAVENOUS | Status: DC | PRN
  Administered 2017-11-29: 5 mg via INTRAVENOUS

## 2017-11-29 MED ORDER — NITROGLYCERIN 0.4 MG SL SUBL
0.8000 mg | SUBLINGUAL_TABLET | Freq: Once | SUBLINGUAL | Status: AC
  Administered 2017-11-29: 0.8 mg via SUBLINGUAL

## 2017-11-29 MED ORDER — IOPAMIDOL (ISOVUE-370) INJECTION 76%
100.0000 mL | Freq: Once | INTRAVENOUS | Status: AC | PRN
  Administered 2017-11-29: 100 mL via INTRAVENOUS

## 2017-11-29 MED ORDER — METOPROLOL TARTRATE 5 MG/5ML IV SOLN
INTRAVENOUS | Status: AC
  Filled 2017-11-29: qty 15

## 2017-11-29 MED ORDER — NITROGLYCERIN 0.4 MG SL SUBL
SUBLINGUAL_TABLET | SUBLINGUAL | Status: AC
  Filled 2017-11-29: qty 2

## 2017-11-29 NOTE — Telephone Encounter (Signed)
Pt calling in today due to instructions for CTA stated if not on beta blocker to take one day of test. Stated Lawson Fiscal will not give beta blocker if HR is already on lower side will send to [place] to [place].

## 2017-11-29 NOTE — Telephone Encounter (Signed)
HR was in the 60's at the time of my visit.   Would hold on beta blocker.   Rosalio Macadamia, RN, ANP-C [facility] [address] [place], [place]  [address] [phone]

## 2017-11-29 NOTE — Progress Notes (Signed)
CT complete. Patient denies any complaints. Patient having snack and beverage.

## 2017-11-29 NOTE — Telephone Encounter (Signed)
Will route to [place] to [place]

## 2017-12-01 ENCOUNTER — Telehealth: Payer: Self-pay | Admitting: *Deleted

## 2017-12-01 NOTE — Telephone Encounter (Signed)
Pt calling in today to get results of CTA, still waiting for the FFR.

## 2017-12-03 ENCOUNTER — Other Ambulatory Visit: Payer: Self-pay | Admitting: *Deleted

## 2017-12-03 DIAGNOSIS — E7800 Pure hypercholesterolemia, unspecified: Secondary | ICD-10-CM

## 2017-12-03 MED ORDER — ROSUVASTATIN CALCIUM 20 MG PO TABS: 20.0000 mg | ORAL_TABLET | Freq: Every day | ORAL | 9 refills | Status: DC

## 2017-12-03 NOTE — Progress Notes (Signed)
Noted  

## 2018-03-11 ENCOUNTER — Other Ambulatory Visit: Payer: Medicare Other

## 2018-03-18 ENCOUNTER — Other Ambulatory Visit: Payer: Medicare Other | Admitting: *Deleted

## 2018-03-18 DIAGNOSIS — E7800 Pure hypercholesterolemia, unspecified: Secondary | ICD-10-CM

## 2018-03-18 LAB — HEPATIC FUNCTION PANEL
ALT: 25 IU/L (ref 0–32)
AST: 19 IU/L (ref 0–40)
Albumin: 4.8 g/dL (ref 3.8–4.8)
Alkaline Phosphatase: 80 IU/L (ref 39–117)
Bilirubin Total: 0.8 mg/dL (ref 0.0–1.2)
Bilirubin, Direct: 0.19 mg/dL (ref 0.00–0.40)
Total Protein: 6.9 g/dL (ref 6.0–8.5)

## 2018-03-18 LAB — LIPID PANEL
Chol/HDL Ratio: 2.4 ratio (ref 0.0–4.4)
Cholesterol, Total: 120 mg/dL (ref 100–199)
HDL: 50 mg/dL (ref >39)
LDL Calculated: 51 mg/dL (ref 0–99)
Triglycerides: 93 mg/dL (ref 0–149)
VLDL Cholesterol Cal: 19 mg/dL (ref 5–40)

## 2018-10-06 NOTE — Progress Notes (Signed)
CARDIOLOGY OFFICE NOTE  Date:  10/12/2018    Maple Mirza Date of Birth: [birth year] Medical Record #[id number]  PCP:  Vivi Barrack, MD  Cardiologist:  Servando Snare   Chief Complaint  Patient presents with  . Follow-up    History of Present Illness: Dawn Perry is a 68 y.o. female who presents today for a one year check. Seen for Dr. Toniann Fail primarily follows with me. Previously seen by Dr. Doreatha Lew.   Remote negative Myoview in 2004 when her LBBB was noted. She has LBBB on EKG with echo from 2002 showing septal lateral dyssynchrony with a normal EF and mild MR, HLD, and remote scarlet fever. Her last echo from 2012 showed an EF 55% and mild MR. Tends to get vagal with blood draws.   We saw her last year - she had gotten lax with lifestyle. Some fatigue and DOE - her younger brother had had CABG a few years earlier - she was worried. We got a coronary CT -   The patient does not have symptoms concerning for COVID-19 infection (fever, chills, cough, or new shortness of breath).   Comes in today. Here alone. She has had a good year. She has done really well with changing her diet - she has lost weight. She feels better. Really restricting her carbs and having no sugars. No exertional chest pain. Breathing is good. She is walking some - less outside since her dog has had some surgery. [place]   Past Medical History:  Diagnosis Date  . GERD (gastroesophageal reflux disease)    B hands  . H/O scarlet fever    age 68  . History of chicken pox   . Hyperlipidemia   . LBBB (left bundle branch block)   . Osteoarthritis     Past Surgical History:  Procedure Laterality Date  . Broke Left forearm    . Right shoulder tear  1990     Medications: Current Meds  Medication Sig  . aspirin EC 81 MG tablet Take 1 tablet (81 mg total) by mouth daily.  . Coenzyme Q10 (CO Q 10 PO) Take by mouth.  . Ibuprofen (MOTRIN PO) Take 600 mg by mouth  as needed (moderate pain).     Allergies: No Known Allergies  Social History: The patient  reports that she has never smoked. She has never used smokeless tobacco. She reports previous alcohol use. She reports that she does not use drugs.   Family History: The patient's family history includes Arthritis in her father and mother; Coronary artery disease in her brother; Hyperlipidemia in her mother; Other in her mother; Parkinson's disease in her father; Parkinsonism in an other family member; Stroke in her mother.   Review of Systems: Please see the history of present illness.   All other systems are reviewed and negative.   Physical Exam: VS:  BP 128/82   Pulse (!) 57   Ht 5\' 5"  (1.651 m)   Wt 124 lb 12.8 oz (56.6 kg)   SpO2 99%   BMI 20.77 kg/m  .  BMI Body mass index is 20.77 kg/m.  Wt Readings from Last 3 Encounters:  10/12/18 124 lb 12.8 oz (56.6 kg)  10/13/17 138 lb 12.8 oz (63 kg)  09/07/17 136 lb 3.2 oz (61.8 kg)    General: Pleasant. Well developed, well nourished and in no acute distress.  Her weight is down 14#. She looks thinner.  HEENT: Normal.  Neck: Supple, no JVD, carotid bruits, or masses noted.  Cardiac: Regular rate and rhythm. No murmurs, rubs, or gallops. No edema.  Respiratory:  Lungs are clear to auscultation bilaterally with normal work of breathing.  GI: Soft and nontender.  MS: No deformity or atrophy. Gait and ROM intact.  Skin: Warm and dry. Color is normal.  Neuro:  Strength and sensation are intact and no gross focal deficits noted.  Psych: Alert, appropriate and with normal affect.   LABORATORY DATA:  EKG:  EKG is ordered today. This demonstrates sinus bradycardia with LBBB. Unchanged.   Lab Results  Component Value Date   WBC 5.6 10/21/2017   HGB 14.4 10/21/2017   HCT 41.2 10/21/2017   PLT 238 10/21/2017   GLUCOSE 93 10/21/2017   CHOL 120 03/18/2018   TRIG 93 03/18/2018   HDL 50 03/18/2018   LDLDIRECT 132.0 02/15/2013   LDLCALC  51 03/18/2018   ALT 25 03/18/2018   AST 19 03/18/2018   NA 142 10/21/2017   K 4.9 10/21/2017   CL 103 10/21/2017   CREATININE 0.84 10/21/2017   BUN 21 10/21/2017   CO2 21 10/21/2017   TSH 2.620 10/21/2017     BNP (last 3 results) No results for input(s): BNP in the last 8760 hours.  ProBNP (last 3 results) No results for input(s): PROBNP in the last 8760 hours.   Other Studies Reviewed Today:  CT CORONAY IMPRESSION 11/2017: 1. Likely non obstructive 3 vessel CAD. Proximal LAD seems worst will be sent for FFR CT  2.  Calcium score 357 which is 92 nd percentile for age and sex  3.  Normal aortic root 2.8 cm  CONCLUSIONS Normal FFR CT  Charlton HawsPeter Nishan    Assessment/Plan:  1. Non obstructive CAD - managed medically. No active symptoms. Doing great with CV risk factor modification. Lab today. No changes made.   2. HTN - BP is fine - she is on no therapy.   3. HLD - does take occasional drug holiday due to side effects - has had to do less of this - rechecking lab today.   4. LBBB -  Chronic.   5. COVID-19 Education: The signs and symptoms of COVID-19 were discussed with the patient and how to seek care for testing (follow up with PCP or arrange E-visit).  The importance of social distancing, staying at home, hand hygiene and wearing a mask when out in public were discussed today.  Current medicines are reviewed with the patient today.  The patient does not have concerns regarding medicines other than what has been noted above.  The following changes have been made:  See above.  Labs/ tests ordered today include:    Orders Placed This Encounter  Procedures  . Basic metabolic panel  . CBC  . Hepatic function panel  . Lipid panel  . EKG 12-Lead     Disposition:   FU with me in one year.   Patient is agreeable to this plan and will call if any problems develop in the interim.   SignedNorma Fredrickson: Kisa Fujii, NP  10/12/2018 9:36 AM  [facility] [address] [place], [place]  [address] Phone: [phone] Fax: [phone]

## 2018-10-12 ENCOUNTER — Ambulatory Visit (INDEPENDENT_AMBULATORY_CARE_PROVIDER_SITE_OTHER): Payer: Medicare Other | Admitting: Nurse Practitioner

## 2018-10-12 ENCOUNTER — Other Ambulatory Visit: Payer: Self-pay

## 2018-10-12 ENCOUNTER — Encounter: Payer: Self-pay | Admitting: Nurse Practitioner

## 2018-10-12 VITALS — BP 128/82 | HR 57 | Ht 65.0 in | Wt 124.8 lb

## 2018-10-12 DIAGNOSIS — E7800 Pure hypercholesterolemia, unspecified: Secondary | ICD-10-CM | POA: Diagnosis not present

## 2018-10-12 DIAGNOSIS — I259 Chronic ischemic heart disease, unspecified: Secondary | ICD-10-CM

## 2018-10-12 DIAGNOSIS — I447 Left bundle-branch block, unspecified: Secondary | ICD-10-CM | POA: Diagnosis not present

## 2018-10-12 LAB — CBC
Hematocrit: 39.3 % (ref 34.0–46.6)
Hemoglobin: 13.3 g/dL (ref 11.1–15.9)
MCH: 31 pg (ref 26.6–33.0)
MCHC: 33.8 g/dL (ref 31.5–35.7)
MCV: 92 fL (ref 79–97)
Platelets: 190 10*3/uL (ref 150–450)
RBC: 4.29 x10E6/uL (ref 3.77–5.28)
RDW: 12.2 % (ref 11.7–15.4)
WBC: 4.7 10*3/uL (ref 3.4–10.8)

## 2018-10-12 LAB — HEPATIC FUNCTION PANEL
ALT: 15 IU/L (ref 0–32)
AST: 19 IU/L (ref 0–40)
Albumin: 4.3 g/dL (ref 3.8–4.8)
Alkaline Phosphatase: 82 IU/L (ref 39–117)
Bilirubin Total: 0.5 mg/dL (ref 0.0–1.2)
Bilirubin, Direct: 0.15 mg/dL (ref 0.00–0.40)
Total Protein: 6.2 g/dL (ref 6.0–8.5)

## 2018-10-12 LAB — LIPID PANEL
Chol/HDL Ratio: 2.5 ratio (ref 0.0–4.4)
Cholesterol, Total: 135 mg/dL (ref 100–199)
HDL: 53 mg/dL (ref >39)
LDL Chol Calc (NIH): 68 mg/dL (ref 0–99)
Triglycerides: 69 mg/dL (ref 0–149)
VLDL Cholesterol Cal: 14 mg/dL (ref 5–40)

## 2018-10-12 LAB — BASIC METABOLIC PANEL
BUN/Creatinine Ratio: 26 (ref 12–28)
BUN: 19 mg/dL (ref 8–27)
CO2: 23 mmol/L (ref 20–29)
Calcium: 9.5 mg/dL (ref 8.7–10.3)
Chloride: 103 mmol/L (ref 96–106)
Creatinine, Ser: 0.74 mg/dL (ref 0.57–1.00)
GFR calc Af Amer: 96 mL/min/{1.73_m2} (ref >59)
GFR calc non Af Amer: 84 mL/min/{1.73_m2} (ref >59)
Glucose: 95 mg/dL (ref 65–99)
Potassium: 5 mmol/L (ref 3.5–5.2)
Sodium: 139 mmol/L (ref 134–144)

## 2018-10-12 MED ORDER — ROSUVASTATIN CALCIUM 20 MG PO TABS: 20.0000 mg | ORAL_TABLET | Freq: Every day | ORAL | 9 refills | Status: DC

## 2018-10-12 MED ORDER — ROSUVASTATIN CALCIUM 20 MG PO TABS: 20.0000 mg | ORAL_TABLET | Freq: Every day | ORAL | 3 refills | Status: DC

## 2018-10-12 NOTE — Patient Instructions (Addendum)
After Visit Summary:  We will be checking the following labs today - BMET, CBC, HPF and Lipids   Medication Instructions:    Continue with your current medicines.  I sent in your refill today.     If you need a refill on your cardiac medications before your next appointment, please call your pharmacy.     Testing/Procedures To Be Arranged:  N/A  Follow-Up:   See me in one year.      At [facility], you and your health needs are our priority.  As part of our continuing mission to provide you with exceptional heart care, we have created designated Provider Care Teams.  These Care Teams include your primary Cardiologist (physician) and Advanced Practice Providers (APPs -  Physician Assistants and Nurse Practitioners) who all work together to provide you with the care you need, when you need it.  Special Instructions:  . Stay safe, stay home, wash your hands for at least 20 seconds and wear a mask when out in public.  . It was good to talk with you today.  Marland Kitchen Keep up the great work!   Call the [place] office at [phone] if you have any questions, problems or concerns.

## 2018-10-21 ENCOUNTER — Telehealth: Payer: Self-pay | Admitting: Physical Therapy

## 2018-10-21 NOTE — Telephone Encounter (Signed)
Copied from [place] [id number] [id number]. Topic: General - Inquiry >> Oct 21, 2018  2:05 PM Mathis Bud wrote: Reason for CRM: Patient wants to know if she can get her pneumonia shot when she gets her flu shot Call back [phone]

## 2018-10-24 NOTE — Telephone Encounter (Signed)
Patient notified that she can received Prevnar 13 same day.

## 2018-11-08 ENCOUNTER — Encounter: Payer: Self-pay | Admitting: Family Medicine

## 2018-11-08 ENCOUNTER — Other Ambulatory Visit: Payer: Self-pay

## 2018-11-08 ENCOUNTER — Ambulatory Visit (INDEPENDENT_AMBULATORY_CARE_PROVIDER_SITE_OTHER): Payer: Medicare Other

## 2018-11-08 DIAGNOSIS — Z23 Encounter for immunization: Secondary | ICD-10-CM | POA: Diagnosis not present

## 2018-11-08 NOTE — Progress Notes (Signed)
Per orders of Dr. Jerline Pain, injection of Prevnar 13 given left deltoid IM by Kevan Ny, CMA Patient tolerated injection well.

## 2018-12-08 ENCOUNTER — Telehealth: Payer: Self-pay | Admitting: Family Medicine

## 2018-12-08 NOTE — Telephone Encounter (Signed)
I left a message asking the patient to call and schedule Medicare AWV for her and her husband with [facility] ([place]).  If patient calls back, please schedule Medicare Wellness Visit at next available opening.  VDM ([place])

## 2018-12-12 ENCOUNTER — Other Ambulatory Visit: Payer: Self-pay

## 2019-01-10 ENCOUNTER — Other Ambulatory Visit: Payer: Self-pay

## 2019-01-10 ENCOUNTER — Ambulatory Visit (INDEPENDENT_AMBULATORY_CARE_PROVIDER_SITE_OTHER): Payer: Medicare Other

## 2019-01-10 VITALS — BP 130/74 | HR 68 | Temp 97.1°F | Ht 65.0 in | Wt 128.2 lb

## 2019-01-10 DIAGNOSIS — Z0000 Encounter for general adult medical examination without abnormal findings: Secondary | ICD-10-CM | POA: Diagnosis not present

## 2019-01-10 DIAGNOSIS — Z780 Asymptomatic menopausal state: Secondary | ICD-10-CM | POA: Diagnosis not present

## 2019-01-10 NOTE — Progress Notes (Signed)
Subjective:   Dawn Perry is a 68 y.o. female who presents for an Initial Medicare Annual Wellness Visit.  Review of Systems     Cardiac Risk Factors include: advanced age (>50men, >41 women);dyslipidemia    Objective:    Today's Vitals   01/10/19 0842  BP: 130/74  Pulse: 68  Temp: (!) 97.1 F (36.2 C)  TempSrc: Temporal  SpO2: 98%  Weight: 128 lb 3.2 oz (58.2 kg)  Height: 5\' 5"  (1.651 m)   Body mass index is 21.33 kg/m.  Advanced Directives 01/10/2019  Does Patient Have a Medical Advance Directive? Yes  Type of Advance Directive Living will;Healthcare Power of Attorney  Does patient want to make changes to medical advance directive? No - Patient declined  Copy of Tamora in Chart? No - copy requested    Current Medications (verified) Outpatient Encounter Medications as of 01/10/2019  Medication Sig  . aspirin EC 81 MG tablet Take 1 tablet (81 mg total) by mouth daily.  . Coenzyme Q10 (CO Q 10 PO) Take by mouth.  . Ibuprofen (MOTRIN PO) Take 600 mg by mouth as needed (moderate pain).  . rosuvastatin (CRESTOR) 20 MG tablet Take 1 tablet (20 mg total) by mouth daily.   No facility-administered encounter medications on file as of 01/10/2019.    Allergies (verified) Patient has no known allergies.   History: Past Medical History:  Diagnosis Date  . GERD (gastroesophageal reflux disease)    B hands  . H/O scarlet fever    age 47  . History of chicken pox   . Hyperlipidemia   . LBBB (left bundle branch block)   . Osteoarthritis    Past Surgical History:  Procedure Laterality Date  . Broke Left forearm    . Right shoulder tear  1990   Family History  Problem Relation Age of Onset  . Arthritis Father   . Parkinson's disease Father   . Hyperlipidemia Mother   . Other Mother        abnormal EKG's  . Arthritis Mother   . Stroke Mother        hemorrhagic  . Parkinsonism Other   . Coronary artery disease Brother        20    . Cancer Neg Hx    Social History   Socioeconomic History  . Marital status: Married    Spouse name: Not on file  . Number of children: 2  . Years of education: Not on file  . Highest education level: Not on file  Occupational History  . Occupation: Clinical cytogeneticist  Tobacco Use  . Smoking status: Never Smoker  . Smokeless tobacco: Never Used  Substance and Sexual Activity  . Alcohol use: Not Currently  . Drug use: No  . Sexual activity: Yes    Partners: Male  Other Topics Concern  . Not on file  Social History Narrative   1 small dog    Social Determinants of Health   Financial Resource Strain:   . Difficulty of Paying Living Expenses: Not on file  Food Insecurity:   . Worried About Charity fundraiser in the Last Year: Not on file  . Ran Out of Food in the Last Year: Not on file  Transportation Needs:   . Lack of Transportation (Medical): Not on file  . Lack of Transportation (Non-Medical): Not on file  Physical Activity:   . Days of Exercise per Week: Not on file  .  Minutes of Exercise per Session: Not on file  Stress:   . Feeling of Stress : Not on file  Social Connections:   . Frequency of Communication with Friends and Family: Not on file  . Frequency of Social Gatherings with Friends and Family: Not on file  . Attends Religious Services: Not on file  . Active Member of Clubs or Organizations: Not on file  . Attends Banker Meetings: Not on file  . Marital Status: Not on file    Tobacco Counseling Counseling given: Not Answered   Clinical Intake:  Pre-visit preparation completed: Yes  Pain : No/denies pain  Diabetes: No  How often do you need to have someone help you when you read instructions, pamphlets, or other written materials from your doctor or pharmacy?: 1 - Never  Interpreter Needed?: No  Information entered by :: Kandis Fantasia LPN   Activities of Daily Living In your present state of health, do you have any  difficulty performing the following activities: 01/10/2019  Hearing? N  Vision? N  Difficulty concentrating or making decisions? N  Walking or climbing stairs? N  Dressing or bathing? N  Doing errands, shopping? N  Preparing Food and eating ? N  Using the Toilet? N  In the past six months, have you accidently leaked urine? N  Do you have problems with loss of bowel control? N  Managing your Medications? N  Managing your Finances? N  Housekeeping or managing your Housekeeping? N  Some recent data might be hidden     Immunizations and Health Maintenance Immunization History  Administered Date(s) Administered  . Fluad Quad(high Dose 65+) 11/08/2018  . Influenza, High Dose Seasonal PF 11/10/2017  . Influenza,inj,Quad PF,6+ Mos 11/01/2012  . Pneumococcal Conjugate-13 11/08/2018  . Tdap 07/29/2011  . Zoster 07/29/2011   Health Maintenance Due  Topic Date Due  . Hepatitis C Screening  1950-04-18    Patient Care Team: Ardith Dark, MD as PCP - General (Family Medicine) Laurey Morale, MD (Cardiology) Mateo Flow, MD (Ophthalmology) Cherlyn Roberts, MD (Dermatology)  Indicate any recent Medical Services you may have received from other than [facility] providers in the past year (date may be approximate).     Assessment:   This is a routine wellness examination for Dawn Perry.  Hearing/Vision screen No exam data present  Dietary issues and exercise activities discussed: Current Exercise Habits: Home exercise routine, Type of exercise: walking, Time (Minutes): 30, Frequency (Times/Week): 5, Weekly Exercise (Minutes/Week): 150, Intensity: Mild  Goals   None    Depression Screen PHQ 2/9 Scores 01/10/2019 09/07/2017  PHQ - 2 Score 0 0    Fall Risk Fall Risk  01/10/2019 09/07/2017  Falls in the past year? 0 No  Injury with Fall? 0 -  Follow up Falls evaluation completed;Education provided;Falls prevention discussed -    Is the patient's home free of loose throw rugs  in walkways, pet beds, electrical cords, etc?   yes      Grab bars in the bathroom? yes      Handrails on the stairs?   yes      Adequate lighting?   yes  Timed Get Up and Go Performed completed and within normal timeframe; no gait abnormalities noted   Cognitive Function: no cognitive concerns at this time  Cognitive Testing  Alert? Yes         Normal Appearance? Yes  Oriented to person? Yes           Place?  Yes  Time? Yes  Recall of three objects? Yes  Can perform simple calculations? Yes  Displays appropriate judgment? Yes  Can read the correct time from a watch face? Yes   Screening Tests Health Maintenance  Topic Date Due  . Hepatitis C Screening  02-Apr-1950  . MAMMOGRAM  10/14/2019  . PNA vac Low Risk Adult (2 of 2 - PPSV23) 11/08/2019  . Fecal DNA (Cologuard)  10/05/2020  . TETANUS/TDAP  07/28/2021  . INFLUENZA VACCINE  Completed  . DEXA SCAN  Completed    Qualifies for Shingles Vaccine? Discussed and patient will check with pharmacy for coverage.  Patient education handout provided   Cancer Screenings: Lung: Low Dose CT Chest recommended if Age 67-80 years, 30 pack-year currently smoking OR have quit w/in 15years. Patient does not qualify. Breast: Up to date on Mammogram? Yes   Up to date of Bone Density/Dexa? No; ordered today  Colorectal: Cologuard 10/11/17 normal     Plan:  I have personally reviewed and addressed the Medicare Annual Wellness questionnaire and have noted the following in the patient's chart:  A. Medical and social history B. Use of alcohol, tobacco or illicit drugs  C. Current medications and supplements D. Functional ability and status E.  Nutritional status F.  Physical activity G. Advance directives H. List of other physicians I.  Hospitalizations, surgeries, and ER visits in previous 12 months J.  Vitals K. Screenings such as hearing and vision if needed, cognitive and depression L. Referrals, records requested, and appointments- dexa  ordered   In addition, I have reviewed and discussed with patient certain preventive protocols, quality metrics, and best practice recommendations. A written personalized care plan for preventive services as well as general preventive health recommendations were provided to patient.   Signed,  Kandis Fantasiaourtney Jaxyn Rout, LPN  Nurse Health Advisor   Nurse Notes: no additional

## 2019-01-10 NOTE — Patient Instructions (Addendum)
Dawn Perry , Thank you for taking time to come for your Medicare Wellness Visit. I appreciate your ongoing commitment to your health goals. Please review the following plan we discussed and let me know if I can assist you in the future.   Screening recommendations/referrals: Colorectal Screening: up to date; Cologuard 10/11/17  Mammogram: up to date; last 10/13/17 Bone Density: recommended   Vision and Dental Exams: Recommended annual ophthalmology exams for early detection of glaucoma and other disorders of the eye Recommended annual dental exams for proper oral hygiene  Vaccinations: Influenza vaccine: completed 11/08/18 Pneumococcal vaccine: up to date; last 11/08/18 Tdap vaccine: up to date; last 07/29/11   Shingles vaccine: Please call your insurance company to determine your out of pocket expense for the Shingrix vaccine. You may receive this vaccine at your local pharmacy.  Advanced directives: Please bring a copy of your POA (Power of Attorney) and/or Living Will to your next appointment.  Goals: Recommend to drink at least 6-8 8oz glasses of water per day and consume a balanced diet rich in fresh fruits and vegetables.    Next appointment: Please schedule your Annual Wellness Visit with your Nurse Health Advisor in one year.  Preventive Care 68 Years and Older, Female Preventive care refers to lifestyle choices and visits with your health care provider that can promote health and wellness. What does preventive care include?  A yearly physical exam. This is also called an annual well check.  Dental exams once or twice a year.  Routine eye exams. Ask your health care provider how often you should have your eyes checked.  Personal lifestyle choices, including:  Daily care of your teeth and gums.  Regular physical activity.  Eating a healthy diet.  Avoiding tobacco and drug use.  Limiting alcohol use.  Practicing safe sex.  Taking low-dose aspirin every day if  recommended by your health care provider.  Taking vitamin and mineral supplements as recommended by your health care provider. What happens during an annual well check? The services and screenings done by your health care provider during your annual well check will depend on your age, overall health, lifestyle risk factors, and family history of disease. Counseling  Your health care provider may ask you questions about your:  Alcohol use.  Tobacco use.  Drug use.  Emotional well-being.  Home and relationship well-being.  Sexual activity.  Eating habits.  History of falls.  Memory and ability to understand (cognition).  Work and work Statistician.  Reproductive health. Screening  You may have the following tests or measurements:  Height, weight, and BMI.  Blood pressure.  Lipid and cholesterol levels. These may be checked every 5 years, or more frequently if you are over 81 years old.  Skin check.  Lung cancer screening. You may have this screening every year starting at age 83 if you have a 30-pack-year history of smoking and currently smoke or have quit within the past 15 years.  Fecal occult blood test (FOBT) of the stool. You may have this test every year starting at age 39.  Flexible sigmoidoscopy or colonoscopy. You may have a sigmoidoscopy every 5 years or a colonoscopy every 10 years starting at age 68.  Hepatitis C blood test.  Hepatitis B blood test.  Sexually transmitted disease (STD) testing.  Diabetes screening. This is done by checking your blood sugar (glucose) after you have not eaten for a while (fasting). You may have this done every 1-3 years.  Bone density scan. This  is done to screen for osteoporosis. You may have this done starting at age 70.  Mammogram. This may be done every 1-2 years. Talk to your health care provider about how often you should have regular mammograms. Talk with your health care provider about your test results,  treatment options, and if necessary, the need for more tests. Vaccines  Your health care provider may recommend certain vaccines, such as:  Influenza vaccine. This is recommended every year.  Tetanus, diphtheria, and acellular pertussis (Tdap, Td) vaccine. You may need a Td booster every 10 years.  Zoster vaccine. You may need this after age 33.  Pneumococcal 13-valent conjugate (PCV13) vaccine. One dose is recommended after age 52.  Pneumococcal polysaccharide (PPSV23) vaccine. One dose is recommended after age 64. Talk to your health care provider about which screenings and vaccines you need and how often you need them. This information is not intended to replace advice given to you by your health care provider. Make sure you discuss any questions you have with your health care provider. Document Released: 02/01/2015 Document Revised: 09/25/2015 Document Reviewed: 11/06/2014 Elsevier Interactive Patient Education  2017 [place] Prevention in the Home Falls can cause injuries. They can happen to people of all ages. There are many things you can do to make your home safe and to help prevent falls. What can I do on the outside of my home?  Regularly fix the edges of walkways and driveways and fix any cracks.  Remove anything that might make you trip as you walk through a door, such as a raised step or threshold.  Trim any bushes or trees on the path to your home.  Use bright outdoor lighting.  Clear any walking paths of anything that might make someone trip, such as rocks or tools.  Regularly check to see if handrails are loose or broken. Make sure that both sides of any steps have handrails.  Any raised decks and porches should have guardrails on the edges.  Have any leaves, snow, or ice cleared regularly.  Use sand or salt on walking paths during winter.  Clean up any spills in your garage right away. This includes oil or grease spills. What can I do in the  bathroom?  Use night lights.  Install grab bars by the toilet and in the tub and shower. Do not use towel bars as grab bars.  Use non-skid mats or decals in the tub or shower.  If you need to sit down in the shower, use a plastic, non-slip stool.  Keep the floor dry. Clean up any water that spills on the floor as soon as it happens.  Remove soap buildup in the tub or shower regularly.  Attach bath mats securely with double-sided non-slip rug tape.  Do not have throw rugs and other things on the floor that can make you trip. What can I do in the bedroom?  Use night lights.  Make sure that you have a light by your bed that is easy to reach.  Do not use any sheets or blankets that are too big for your bed. They should not hang down onto the floor.  Have a firm chair that has side arms. You can use this for support while you get dressed.  Do not have throw rugs and other things on the floor that can make you trip. What can I do in the kitchen?  Clean up any spills right away.  Avoid walking on wet floors.  Keep items that you use a lot in easy-to-reach places.  If you need to reach something above you, use a strong step stool that has a grab bar.  Keep electrical cords out of the way.  Do not use floor polish or wax that makes floors slippery. If you must use wax, use non-skid floor wax.  Do not have throw rugs and other things on the floor that can make you trip. What can I do with my stairs?  Do not leave any items on the stairs.  Make sure that there are handrails on both sides of the stairs and use them. Fix handrails that are broken or loose. Make sure that handrails are as long as the stairways.  Check any carpeting to make sure that it is firmly attached to the stairs. Fix any carpet that is loose or worn.  Avoid having throw rugs at the top or bottom of the stairs. If you do have throw rugs, attach them to the floor with carpet tape.  Make sure that you have a  light switch at the top of the stairs and the bottom of the stairs. If you do not have them, ask someone to add them for you. What else can I do to help prevent falls?  Wear shoes that:  Do not have high heels.  Have rubber bottoms.  Are comfortable and fit you well.  Are closed at the toe. Do not wear sandals.  If you use a stepladder:  Make sure that it is fully opened. Do not climb a closed stepladder.  Make sure that both sides of the stepladder are locked into place.  Ask someone to hold it for you, if possible.  Clearly mark and make sure that you can see:  Any grab bars or handrails.  First and last steps.  Where the edge of each step is.  Use tools that help you move around (mobility aids) if they are needed. These include:  Canes.  Walkers.  Scooters.  Crutches.  Turn on the lights when you go into a dark area. Replace any light bulbs as soon as they burn out.  Set up your furniture so you have a clear path. Avoid moving your furniture around.  If any of your floors are uneven, fix them.  If there are any pets around you, be aware of where they are.  Review your medicines with your doctor. Some medicines can make you feel dizzy. This can increase your chance of falling. Ask your doctor what other things that you can do to help prevent falls. This information is not intended to replace advice given to you by your health care provider. Make sure you discuss any questions you have with your health care provider. Document Released: 11/01/2008 Document Revised: 06/13/2015 Document Reviewed: 02/09/2014 Elsevier Interactive Patient Education  2017 [employer].

## 2019-02-09 ENCOUNTER — Ambulatory Visit: Payer: Medicare Other | Attending: Internal Medicine

## 2019-02-09 DIAGNOSIS — Z23 Encounter for immunization: Secondary | ICD-10-CM

## 2019-02-09 NOTE — Progress Notes (Signed)
   Covid-19 Vaccination Clinic  Name:  MARIALUIZA CAR    MRN: [id number] DOB: [birth year]  02/09/2019  Ms. Dunkley was observed post Covid-19 immunization for 15 minutes without incidence. She was provided with Vaccine Information Sheet and instruction to access the V-Safe system.   Ms. Mcavoy was instructed to call 911 with any severe reactions post vaccine: Marland Kitchen Difficulty breathing  . Swelling of your face and throat  . A fast heartbeat  . A bad rash all over your body  . Dizziness and weakness    Immunizations Administered    Name Date Dose VIS Date Route   Pfizer COVID-19 Vaccine 02/09/2019  1:06 PM 0.3 mL 12/30/2018 Intramuscular   Manufacturer: [employer], [employer]   Lot: [id number]   NDC: [address]-3

## 2019-03-02 ENCOUNTER — Ambulatory Visit: Payer: Medicare Other | Attending: Internal Medicine

## 2019-03-02 DIAGNOSIS — Z23 Encounter for immunization: Secondary | ICD-10-CM | POA: Insufficient documentation

## 2019-03-02 NOTE — Progress Notes (Signed)
   Covid-19 Vaccination Clinic  Name:  Dawn Perry    MRN: [id number] DOB: Jun 18, 1950  03/02/2019  [name] was observed post Covid-19 immunization for 15 minutes without incidence. She was provided with Vaccine Information Sheet and instruction to access the V-Safe system.   [name] was instructed to call 911 with any severe reactions post vaccine: Marland Kitchen Difficulty breathing  . Swelling of your face and throat  . A fast heartbeat  . A bad rash all over your body  . Dizziness and weakness    Immunizations Administered    Name Date Dose VIS Date Route   Pfizer COVID-19 Vaccine 03/02/2019  1:27 PM 0.3 mL 12/30/2018 Intramuscular   Manufacturer: [employer], [employer]   Lot: [id number]   NDC: [address]-9

## 2019-04-14 ENCOUNTER — Inpatient Hospital Stay: Admission: RE | Admit: 2019-04-14 | Payer: Medicare Other | Source: Ambulatory Visit

## 2019-04-24 DIAGNOSIS — M85852 Other specified disorders of bone density and structure, left thigh: Secondary | ICD-10-CM | POA: Diagnosis not present

## 2019-04-24 DIAGNOSIS — Z780 Asymptomatic menopausal state: Secondary | ICD-10-CM | POA: Diagnosis not present

## 2019-04-24 LAB — HM DEXA SCAN

## 2019-04-25 ENCOUNTER — Encounter: Payer: Self-pay | Admitting: Family Medicine

## 2019-05-04 ENCOUNTER — Encounter: Payer: Self-pay | Admitting: Family Medicine

## 2019-05-11 ENCOUNTER — Telehealth: Payer: Self-pay

## 2019-05-11 DIAGNOSIS — M8580 Other specified disorders of bone density and structure, unspecified site: Secondary | ICD-10-CM

## 2019-05-11 NOTE — Telephone Encounter (Signed)
Requesting Results

## 2019-05-11 NOTE — Telephone Encounter (Signed)
Notified patient we will call her with results once Dr.Parker review

## 2019-05-11 NOTE — Telephone Encounter (Signed)
I dont see where her most recent DEXA has been abstracted in. Have you all seen it?  Dawn Perry. Jimmey Ralph, MD 05/11/2019 1:30 PM

## 2019-05-11 NOTE — Telephone Encounter (Signed)
Patient calling to find out the results for her bone density scan. Please follow up with patient

## 2019-05-12 DIAGNOSIS — M8580 Other specified disorders of bone density and structure, unspecified site: Secondary | ICD-10-CM | POA: Insufficient documentation

## 2019-05-12 NOTE — Telephone Encounter (Signed)
Her DEXA scan showed that she has mild thinning of the bones with no signs of osteoporosis.  She should make sure that she is getting adequate vitamin D and calcium in her diet.  Recommend at least 800 international units of vitamin D daily and 1200 mg of calcium daily.  She can take supplement if needed.  She should have repeat bone density scan done in 2 years.  Dawn Perry. Jimmey Ralph, MD 05/12/2019 2:44 PM

## 2019-05-12 NOTE — Telephone Encounter (Signed)
Patient is returning Khamika's call about the results.

## 2019-05-12 NOTE — Telephone Encounter (Signed)
Requesting Bone Density Results

## 2019-05-12 NOTE — Telephone Encounter (Signed)
Left voice message for patient to call clinic.  

## 2019-05-30 ENCOUNTER — Telehealth: Payer: Self-pay | Admitting: *Deleted

## 2019-05-30 NOTE — Telephone Encounter (Signed)
Patient is calling in for her Bone Density Results, states if she does not answer if someone could leave a detailed message.

## 2019-05-30 NOTE — Telephone Encounter (Signed)
Pt requesting Dexa scan results  Results given : DEXA scan showed that she has mild thinning of the bones with no signs of osteoporosis.  She should make sure that she is getting adequate vitamin D and calcium in her diet.  Recommend at least 800 international units of vitamin D daily and 1200 mg of calcium daily.  She can take supplement if needed.  She should have repeat bone density scan done in 2 years. Pt verbalized understanding

## 2019-09-16 DIAGNOSIS — Z03818 Encounter for observation for suspected exposure to other biological agents ruled out: Secondary | ICD-10-CM | POA: Diagnosis not present

## 2019-10-02 NOTE — Progress Notes (Addendum)
CARDIOLOGY OFFICE NOTE  Date:  10/11/2019    Jamse Mead Date of Birth: 11-Mar-1950 Medical Record #[id number]  PCP:  Ardith Dark, MD  Cardiologist:  Tyrone Sage   Chief Complaint  Patient presents with  . Follow-up    History of Present Illness: Dawn Perry is a 69 y.o. female who presents today for a follow up visit. This is a one year check.  Seen for Dr. Sherin Quarry primarily follows with me. Previously seen by Dr. Deborah Chalk.   Remote negative Myoview in 2004 when her LBBB was noted. She has LBBB on EKG with echo from 2002 showing septal lateral dyssynchrony with a normal EF and mild MR, HLD, and remote scarlet fever. Her last echo from 2012 showed an EF 55% and mild MR. Tends to get vagal with blood draws.   When seen in 2019 - she had gotten lax with lifestyle. Some fatigue and DOE - her younger brother had had CABG a few years earlier - she was worried. We got a coronary CT - has non obstructive disease - to manage medically. On last visit last September she was doing very well - had lost weight/changed her diet, etc. No worrisome symptoms noted.   Comes in today. Here alone. She continues to do well. She has gained a few pounds. Notes her quantity of food is probably a little too much. Some stress eating. Her son and his wife are getting divorced - this has caused a great deal of angst - seems to be improving. No exertional chest pain. Has had some fleeting sharp pain randomly. Nothing exertional. Not short of breath. Not dizzy. Not fasting today.   Past Medical History:  Diagnosis Date  . GERD (gastroesophageal reflux disease)    B hands  . H/O scarlet fever    age 35  . History of chicken pox   . Hyperlipidemia   . LBBB (left bundle branch block)   . Osteoarthritis     Past Surgical History:  Procedure Laterality Date  . Broke Left forearm    . Right shoulder tear  1990     Medications: Current Meds  Medication Sig  . aspirin EC 81 MG tablet  Take 1 tablet (81 mg total) by mouth daily.  . Coenzyme Q10 (CO Q 10 PO) Take by mouth.  . Multiple Vitamin (MULTI-VITAMIN DAILY) TABS Take by mouth.  . rosuvastatin (CRESTOR) 20 MG tablet Take 1 tablet (20 mg total) by mouth daily.     Allergies: No Known Allergies  Social History: The patient  reports that she has never smoked. She has never used smokeless tobacco. She reports previous alcohol use. She reports that she does not use drugs.   Family History: The patient's family history includes Arthritis in her father and mother; Coronary artery disease in her brother; Hyperlipidemia in her mother; Other in her mother; Parkinson's disease in her father; Parkinsonism in an other family member; Stroke in her mother.   Review of Systems: Please see the history of present illness.   All other systems are reviewed and negative.   Physical Exam: VS:  BP 114/82   Pulse 62   Ht 5\' 5"  (1.651 m)   Wt 133 lb 9.6 oz (60.6 kg)   SpO2 96%   BMI 22.23 kg/m  .  BMI Body mass index is 22.23 kg/m.  Wt Readings from Last 3 Encounters:  10/11/19 133 lb 9.6 oz (60.6 kg)  01/10/19 128 lb 3.2 oz (58.2  kg)  10/12/18 124 lb 12.8 oz (56.6 kg)    General: Pleasant. Alert and in no acute distress. Her weight is up a few pounds.  Cardiac: Regular rate and rhythm. No murmurs, rubs, or gallops. No edema.  Respiratory:  Lungs are clear to auscultation bilaterally with normal work of breathing.  GI: Soft and nontender.  MS: No deformity or atrophy. Gait and ROM intact.  Skin: Warm and dry. Color is normal.  Neuro:  Strength and sensation are intact and no gross focal deficits noted.  Psych: Alert, appropriate and with normal affect.   LABORATORY DATA:  EKG:  EKG is ordered today.  Personally reviewed by me. This demonstrates NSR with LBBB - unchanged  Lab Results  Component Value Date   WBC 4.7 10/12/2018   HGB 13.3 10/12/2018   HCT 39.3 10/12/2018   PLT 190 10/12/2018   GLUCOSE 95 10/12/2018    CHOL 135 10/12/2018   TRIG 69 10/12/2018   HDL 53 10/12/2018   LDLDIRECT 132.0 02/15/2013   LDLCALC 68 10/12/2018   ALT 15 10/12/2018   AST 19 10/12/2018   NA 139 10/12/2018   K 5.0 10/12/2018   CL 103 10/12/2018   CREATININE 0.74 10/12/2018   BUN 19 10/12/2018   CO2 23 10/12/2018   TSH 2.620 10/21/2017     BNP (last 3 results) No results for input(s): BNP in the last 8760 hours.  ProBNP (last 3 results) No results for input(s): PROBNP in the last 8760 hours.   Other Studies Reviewed Today:  CT CORONAY IMPRESSION 11/2017: 1. Likely non obstructive 3 vessel CAD. Proximal LAD seems worst will be sent for FFR CT  2. Calcium score 357 which is 92 nd percentile for age and sex  3. Normal aortic root 2.8 cm  CONCLUSIONS Normal FFR CT  Charlton Haws    Assessment/Plan:  1. Non obstructive CAD - managed medically - no worrisome symptoms. Needs to get back on track with CV risk factor modification - I think the stress with her son has gotten her off track - this seems to be improving. Continue aspirin and statin therapy.   2. HTN - BP is good - she is not on therapy.   3. HLD - on statin therapy - has had to take prior drug holiday due to side effects - no real issues noted - will check fasting labs later this week or next.   4. Chronic LBBB - unchanged.   5. Situational stress - this seems to be getting better.   Current medicines are reviewed with the patient today.  The patient does not have concerns regarding medicines other than what has been noted above.  The following changes have been made:  See above.  Labs/ tests ordered today include:    Orders Placed This Encounter  Procedures  . Basic metabolic panel  . CBC  . Hepatic function panel  . Lipid panel  . EKG 12-Lead     Disposition:   FU with Dr. Duke Salvia in one year as new patient.    Patient is agreeable to this plan and will call if any problems develop in the interim.    SignedNorma Fredrickson, NP  10/11/2019 9:56 AM  [facility] [address] [place], [place]  [address] Phone: [phone] Fax: [phone]      Addendum: After our visit she had questions regarding carotid dopplers - sounds like she had remote study many years ago.  Vascular screening will be obtained. Would continue with aspirin/statin therapy along with CV risk factor modification as we discussed.   Rosalio Macadamia, RN, ANP-C [facility] [address] [place], [place]  [address] [phone]

## 2019-10-11 ENCOUNTER — Other Ambulatory Visit: Payer: Self-pay

## 2019-10-11 ENCOUNTER — Ambulatory Visit (INDEPENDENT_AMBULATORY_CARE_PROVIDER_SITE_OTHER): Payer: Medicare Other | Admitting: Nurse Practitioner

## 2019-10-11 ENCOUNTER — Encounter: Payer: Self-pay | Admitting: Nurse Practitioner

## 2019-10-11 VITALS — BP 114/82 | HR 62 | Ht 65.0 in | Wt 133.6 lb

## 2019-10-11 DIAGNOSIS — I447 Left bundle-branch block, unspecified: Secondary | ICD-10-CM | POA: Diagnosis not present

## 2019-10-11 DIAGNOSIS — E7800 Pure hypercholesterolemia, unspecified: Secondary | ICD-10-CM

## 2019-10-11 DIAGNOSIS — I259 Chronic ischemic heart disease, unspecified: Secondary | ICD-10-CM

## 2019-10-11 NOTE — Addendum Note (Signed)
Addended by: Brien Mates R on: 10/11/2019 10:12 AM   Modules accepted: Orders

## 2019-10-11 NOTE — Patient Instructions (Addendum)
After Visit Summary:  We will be checking the following labs today - NONE  BMET, CBC, HPF and LIpids   Medication Instructions:    Continue with your current medicines.    If you need a refill on your cardiac medications before your next appointment, please call your pharmacy.     Testing/Procedures To Be Arranged:  N/A  Follow-Up:   See Dr. Duke Salvia in one year (new patient) - Your physician wants you to follow-up in: You will receive a reminder letter in the mail two months in advance. If you don't receive a letter, please call our office to schedule the follow-up appointment.   At [facility], you and your health needs are our priority.  As part of our continuing mission to provide you with exceptional heart care, we have created designated Provider Care Teams.  These Care Teams include your primary Cardiologist (physician) and Advanced Practice Providers (APPs -  Physician Assistants and Nurse Practitioners) who all work together to provide you with the care you need, when you need it.  Special Instructions:  . Stay safe, wash your hands for at least 20 seconds and wear a mask when needed.  . It was good to talk with you today.    Call the [facility] office at [phone] if you have any questions, problems or concerns.

## 2019-10-11 NOTE — Addendum Note (Signed)
Addended by: Rosalio Macadamia on: 10/11/2019 10:23 AM   Modules accepted: Orders

## 2019-10-16 ENCOUNTER — Ambulatory Visit (HOSPITAL_COMMUNITY)
Admission: RE | Admit: 2019-10-16 | Discharge: 2019-10-16 | Disposition: A | Discharge status: Home / Self Care | Payer: Medicare Other | Source: Ambulatory Visit | Attending: Internal Medicine | Admitting: Internal Medicine

## 2019-10-16 ENCOUNTER — Other Ambulatory Visit: Payer: Self-pay

## 2019-10-16 DIAGNOSIS — E7800 Pure hypercholesterolemia, unspecified: Secondary | ICD-10-CM

## 2019-10-16 DIAGNOSIS — I259 Chronic ischemic heart disease, unspecified: Secondary | ICD-10-CM

## 2019-10-17 ENCOUNTER — Other Ambulatory Visit: Payer: Medicare Other

## 2019-10-17 DIAGNOSIS — I447 Left bundle-branch block, unspecified: Secondary | ICD-10-CM | POA: Diagnosis not present

## 2019-10-17 DIAGNOSIS — I259 Chronic ischemic heart disease, unspecified: Secondary | ICD-10-CM | POA: Diagnosis not present

## 2019-10-17 DIAGNOSIS — E7800 Pure hypercholesterolemia, unspecified: Secondary | ICD-10-CM

## 2019-10-17 LAB — BASIC METABOLIC PANEL
BUN/Creatinine Ratio: 13 (ref 12–28)
BUN: 11 mg/dL (ref 8–27)
CO2: 23 mmol/L (ref 20–29)
Calcium: 9.7 mg/dL (ref 8.7–10.3)
Chloride: 103 mmol/L (ref 96–106)
Creatinine, Ser: 0.83 mg/dL (ref 0.57–1.00)
GFR calc Af Amer: 83 mL/min/{1.73_m2} (ref >59)
GFR calc non Af Amer: 72 mL/min/{1.73_m2} (ref >59)
Glucose: 100 mg/dL — ABNORMAL HIGH (ref 65–99)
Potassium: 4.8 mmol/L (ref 3.5–5.2)
Sodium: 141 mmol/L (ref 134–144)

## 2019-10-17 LAB — HEPATIC FUNCTION PANEL
ALT: 25 IU/L (ref 0–32)
AST: 20 IU/L (ref 0–40)
Albumin: 4.8 g/dL (ref 3.8–4.8)
Alkaline Phosphatase: 85 IU/L (ref 44–121)
Bilirubin Total: 0.5 mg/dL (ref 0.0–1.2)
Bilirubin, Direct: 0.15 mg/dL (ref 0.00–0.40)
Total Protein: 7 g/dL (ref 6.0–8.5)

## 2019-10-17 LAB — CBC
Hematocrit: 42.4 % (ref 34.0–46.6)
Hemoglobin: 14 g/dL (ref 11.1–15.9)
MCH: 30.5 pg (ref 26.6–33.0)
MCHC: 33 g/dL (ref 31.5–35.7)
MCV: 92 fL (ref 79–97)
Platelets: 206 10*3/uL (ref 150–450)
RBC: 4.59 x10E6/uL (ref 3.77–5.28)
RDW: 12.3 % (ref 11.7–15.4)
WBC: 5.3 10*3/uL (ref 3.4–10.8)

## 2019-10-17 LAB — LIPID PANEL
Chol/HDL Ratio: 2.8 ratio (ref 0.0–4.4)
Cholesterol, Total: 148 mg/dL (ref 100–199)
HDL: 53 mg/dL (ref >39)
LDL Chol Calc (NIH): 72 mg/dL (ref 0–99)
Triglycerides: 129 mg/dL (ref 0–149)
VLDL Cholesterol Cal: 23 mg/dL (ref 5–40)

## 2019-10-26 DIAGNOSIS — Z23 Encounter for immunization: Secondary | ICD-10-CM | POA: Diagnosis not present

## 2019-11-15 ENCOUNTER — Other Ambulatory Visit: Payer: Self-pay | Admitting: Nurse Practitioner

## 2019-11-15 DIAGNOSIS — E7800 Pure hypercholesterolemia, unspecified: Secondary | ICD-10-CM

## 2019-11-15 MED ORDER — ROSUVASTATIN CALCIUM 20 MG PO TABS: 20.0000 mg | ORAL_TABLET | Freq: Every day | ORAL | 3 refills | Status: DC

## 2019-11-15 NOTE — Telephone Encounter (Signed)
Pt's medication was sent to pt's pharmacy as requested. Confirmation received.  °

## 2019-11-15 NOTE — Telephone Encounter (Signed)
*  STAT* If patient is at the pharmacy, call can be transferred to refill team.   1. Which medications need to be refilled? (please list name of each medication and dose if known) rosuvastatin (CRESTOR) 20 MG tablet  2. Which pharmacy/location (including street and city if local pharmacy) is medication to be sent to? [id number] - , [place] - [address]  3. Do they need a 30 day or 90 day supply? 90 day   Patient is out of medication

## 2019-11-17 DIAGNOSIS — M25511 Pain in right shoulder: Secondary | ICD-10-CM | POA: Diagnosis not present

## 2019-12-29 ENCOUNTER — Other Ambulatory Visit: Payer: Self-pay

## 2019-12-29 ENCOUNTER — Encounter: Payer: Self-pay | Admitting: Family Medicine

## 2019-12-29 ENCOUNTER — Ambulatory Visit (INDEPENDENT_AMBULATORY_CARE_PROVIDER_SITE_OTHER): Payer: Medicare Other | Admitting: Family Medicine

## 2019-12-29 VITALS — BP 135/64 | HR 59 | Temp 97.2°F | Ht 65.0 in | Wt 137.0 lb

## 2019-12-29 DIAGNOSIS — E7800 Pure hypercholesterolemia, unspecified: Secondary | ICD-10-CM | POA: Diagnosis not present

## 2019-12-29 DIAGNOSIS — Z23 Encounter for immunization: Secondary | ICD-10-CM

## 2019-12-29 DIAGNOSIS — M8580 Other specified disorders of bone density and structure, unspecified site: Secondary | ICD-10-CM

## 2019-12-29 DIAGNOSIS — M169 Osteoarthritis of hip, unspecified: Secondary | ICD-10-CM | POA: Insufficient documentation

## 2019-12-29 DIAGNOSIS — M1990 Unspecified osteoarthritis, unspecified site: Secondary | ICD-10-CM | POA: Insufficient documentation

## 2019-12-29 NOTE — Assessment & Plan Note (Signed)
Follows with orthopedics. 

## 2019-12-29 NOTE — Addendum Note (Signed)
Addended by: Dyann Kief on: 12/29/2019 11:41 AM   Modules accepted: Orders

## 2019-12-29 NOTE — Progress Notes (Signed)
   Dawn Perry is a 69 y.o. female who presents today for an office visit.  Assessment/Plan:  Chronic Problems Addressed Today: Hyperlipidemia On Crestor per cardiology.  Last LDL 72.  Will need to recheck in about a year.  Osteoarthritis Follows with orthopedics.  Osteopenia Last bone density scan T score -1.1.  Will need repeat in 2023.  Continue vitamin D and calcium supplementation with multivitamin.   Preventative Healthcare Flu pneumonia vaccine given today.  Up-to-date on other vaccines and screenings.    Subjective:  HPI:  See A/p.         Objective:  Physical Exam: BP 135/64   Pulse (!) 59   Temp (!) 97.2 F (36.2 C) (Temporal)   Ht 5\' 5"  (1.651 m)   Wt 137 lb (62.1 kg)   SpO2 99%   BMI 22.80 kg/m   Gen: No acute distress, resting comfortably CV: Regular rate and rhythm with no murmurs appreciated Pulm: Normal work of breathing, clear to auscultation bilaterally with no crackles, wheezes, or rhonchi Neuro: Grossly normal, moves all extremities Psych: Normal affect and thought content      Maricia Scotti M. , MD 12/29/2019 11:38 AM

## 2019-12-29 NOTE — Assessment & Plan Note (Signed)
Last bone density scan T score -1.1.  Will need repeat in 2023.  Continue vitamin D and calcium supplementation with multivitamin.

## 2019-12-29 NOTE — Assessment & Plan Note (Addendum)
On Crestor per cardiology.  Last LDL 72.  Will need to recheck in about a year.

## 2019-12-29 NOTE — Patient Instructions (Signed)
It was very nice to see you today!  We will give your flu and pneumonia vaccine today.  Please keep working on diet and exercise.  We should repeat your bone density scan in 2 years.  I will see you back in a year.  Please come back to see me sooner if needed.  Take care, Dr Jimmey Ralph  Please try these tips to maintain a healthy lifestyle:   Eat at least 3 REAL meals and 1-2 snacks per day.  Aim for no more than 5 hours between eating.  If you eat breakfast, please do so within one hour of getting up.    Each meal should contain half fruits/vegetables, one quarter protein, and one quarter carbs (no bigger than a computer mouse)   Cut down on sweet beverages. This includes juice, soda, and sweet tea.     Drink at least 1 glass of water with each meal and aim for at least 8 glasses per day   Exercise at least 150 minutes every week.

## 2020-01-02 DIAGNOSIS — M25512 Pain in left shoulder: Secondary | ICD-10-CM | POA: Diagnosis not present

## 2020-01-15 DIAGNOSIS — Z03818 Encounter for observation for suspected exposure to other biological agents ruled out: Secondary | ICD-10-CM | POA: Diagnosis not present

## 2020-02-01 DIAGNOSIS — M25512 Pain in left shoulder: Secondary | ICD-10-CM | POA: Diagnosis not present

## 2020-02-08 DIAGNOSIS — M75122 Complete rotator cuff tear or rupture of left shoulder, not specified as traumatic: Secondary | ICD-10-CM | POA: Diagnosis not present

## 2020-03-11 ENCOUNTER — Ambulatory Visit: Payer: Medicare Other

## 2020-05-23 DIAGNOSIS — M19011 Primary osteoarthritis, right shoulder: Secondary | ICD-10-CM | POA: Diagnosis not present

## 2020-06-13 DIAGNOSIS — M25551 Pain in right hip: Secondary | ICD-10-CM | POA: Diagnosis not present

## 2020-06-19 ENCOUNTER — Telehealth: Payer: Self-pay | Admitting: *Deleted

## 2020-06-19 DIAGNOSIS — E7800 Pure hypercholesterolemia, unspecified: Secondary | ICD-10-CM

## 2020-06-19 NOTE — Telephone Encounter (Signed)
   [facility] Pre-operative Risk Assessment    Patient Name: Dawn Perry  DOB: [birth year]  MRN: [id number]   Request for surgical clearance:  1. What type of surgery is being performed? RIGHT TOTAL HIP ARTHROPLASTY-ANTERIOR    2. When is this surgery scheduled? 10/09/2020   3. What type of clearance is required (medical clearance vs. Pharmacy clearance to hold med vs. Both)?   4. Are there any medications that need to be held prior to surgery and how long?   5. Practice name and name of physician performing surgery? [place]    6. What is the office phone number? [phone]   7.   What is the office fax number? [place]   Anesthesia type (None, local, MAC, general) ? CHOICE    Alvina Filbert 06/19/2020, 4:39 PM  _________________________________________________________________   (provider comments below)

## 2020-06-20 NOTE — Telephone Encounter (Signed)
   Name: Dawn Perry  DOB: [birth year]  MRN: [id number]  Primary Cardiologist: None  Chart reviewed as part of pre-operative protocol coverage. Because of Dawn Perry's past medical history and time since last visit, she will require a follow-up visit in order to better assess preoperative cardiovascular risk.  She was last seen 10/11/19 by Norma Fredrickson, NP. Coronary CT 2019 with calcium score 357 placing her in 92nd percentile for age/sex with nonobstructive CAD (prox LAD 50%, Cx <50%, RCA <50%). She had vascular screening 09/2019 with mild plaque visualized in bilateral carotid artery. No abdominal aortic aneurysm nor PAD. She has been maintained on aspirin, rosuvastatin. Lab work 09/2019 with LDL 72.   She has an appointment upcoming with Dr. Melvern Banker 09/20/20 to establish care as Norma Fredrickson, NP has retired. Appt notes state 'preop'. As her surgery is 10/09/20, will route to Dr. Duke Salvia to see what testing she would like done prior to appt.   Alver Sorrow, NP  06/20/2020, 11:49 AM

## 2020-06-21 NOTE — Telephone Encounter (Signed)
S/w pt and she is agreeable to fasting labs to be done in 09/2020. Per pre op provider to have pt come in for lab work to be done ion 9/2 , though pt states she will be out of town. Pt said she not be until 9/6. Pt will come in first thing in AM on 9/6 for fasting lipid and cmp. I have placed the lab orders to be done at [facility] office. Pt thanked me for the call.

## 2020-06-21 NOTE — Telephone Encounter (Signed)
No additional testing needed prior to the appointment.

## 2020-09-17 NOTE — H&P (Signed)
TOTAL HIP ADMISSION H&P  Patient is admitted for right total hip arthroplasty.  Subjective:  Chief Complaint: Right hip pain  HPI: Dawn Perry, 70 y.o. female, has a history of pain and functional disability in the right hip due to arthritis and patient has failed non-surgical conservative treatments for greater than 12 weeks to include NSAID's and/or analgesics and activity modification. Onset of symptoms was gradual, starting  several  years ago with gradually worsening course since that time. The patient noted no past surgery on the right hip. Patient currently rates pain in the right hip at 7 out of 10 with activity. Patient has night pain, worsening of pain with activity and weight bearing, pain that interfers with activities of daily living, and pain with passive range of motion. Patient has evidence of  severe arthritis in the right hip, bone-on-bone, with large osteophyte formation and subchondral cystic formation  by imaging studies. This condition presents safety issues increasing the risk of falls. There is no current active infection.  Patient Active Problem List   Diagnosis Date Noted   Osteoarthritis 12/29/2019   Osteopenia 05/12/2019   LBBB (left bundle branch block) 03/11/2011   Hyperlipidemia 03/11/2011    Past Medical History:  Diagnosis Date   GERD (gastroesophageal reflux disease)    B hands   H/O scarlet fever    age 78   History of chicken pox    Hyperlipidemia    LBBB (left bundle branch block)    Osteoarthritis     Past Surgical History:  Procedure Laterality Date   Broke Left forearm     Right shoulder tear  1990    Prior to Admission medications   Medication Sig Start Date End Date Taking? Authorizing Provider  aspirin EC 81 MG tablet Take 1 tablet (81 mg total) by mouth daily. 11/16/17   Rosalio Macadamia, NP  Coenzyme Q10 (CO Q 10 PO) Take by mouth.    [provider]  Multiple Vitamin (MULTI-VITAMIN DAILY) TABS Take by mouth.     [provider]  rosuvastatin (CRESTOR) 20 MG tablet Take 1 tablet (20 mg total) by mouth daily. 11/15/19   Rosalio Macadamia, NP    No Known Allergies  Social History   Socioeconomic History   Marital status: Married    Spouse name: Not on file   Number of children: 2   Years of education: Not on file   Highest education level: Not on file  Occupational History   Occupation: Real Psychologist, occupational  Tobacco Use   Smoking status: Never   Smokeless tobacco: Never  Vaping Use   Vaping Use: Never used  Substance and Sexual Activity   Alcohol use: Not Currently   Drug use: No   Sexual activity: Yes    Partners: Male  Other Topics Concern   Not on file  Social History Narrative   1 small dog    Social Determinants of Health   Financial Resource Strain: Not on file  Food Insecurity: Not on file  Transportation Needs: Not on file  Physical Activity: Not on file  Stress: Not on file  Social Connections: Not on file  Intimate Partner Violence: Not on file    Tobacco Use: Low Risk    Smoking Tobacco Use: Never   Smokeless Tobacco Use: Never   Social History   Substance and Sexual Activity  Alcohol Use Not Currently    Family History  Problem Relation Age of Onset   Arthritis Father  Parkinson's disease Father    Hyperlipidemia Mother    Other Mother        abnormal EKG's   Arthritis Mother    Stroke Mother        hemorrhagic   Parkinsonism Other    Coronary artery disease Brother        50   Cancer Neg Hx     Review of Systems  Constitutional:  Negative for chills and fever.  HENT:  Negative for congestion, sore throat and tinnitus.   Eyes:  Negative for double vision, photophobia and pain.  Respiratory:  Negative for cough, shortness of breath and wheezing.   Cardiovascular:  Negative for chest pain, palpitations and orthopnea.  Gastrointestinal:  Negative for heartburn, nausea and vomiting.  Genitourinary:  Negative for dysuria, frequency and  urgency.  Musculoskeletal:  Positive for joint pain.  Neurological:  Negative for dizziness, weakness and headaches.    Objective:  Physical Exam: Well nourished and well developed.  General: Alert and oriented x3, cooperative and pleasant, no acute distress.  Head: normocephalic, atraumatic, neck supple.  Eyes: EOMI.  Respiratory: breath sounds clear in all fields, no wheezing, rales, or rhonchi. Cardiovascular: Regular rate and rhythm, no murmurs, gallops or rubs.  Abdomen: non-tender to palpation and soft, normoactive bowel sounds. Musculoskeletal:  Right Hip Exam:  The range of motion: Flexion to 100 degrees, Internal Rotation to 0 degrees, External Rotation to 10 degrees, and abduction to 10 degrees with pain on range of motion.  There is no tenderness over the greater trochanteric bursa.  Calves soft and nontender. Motor function intact in LE. Strength 5/5 LE bilaterally. Neuro: Distal pulses 2+. Sensation to light touch intact in LE.  Imaging Review Plain radiographs demonstrate severe degenerative joint disease of the right hip. The bone quality appears to be adequate for age and reported activity level.  Assessment/Plan:  End stage arthritis, right hip  The patient history, physical examination, clinical judgement of the provider and imaging studies are consistent with end stage degenerative joint disease of the right hip and total hip arthroplasty is deemed medically necessary. The treatment options including medical management, injection therapy, arthroscopy and arthroplasty were discussed at length. The risks and benefits of total hip arthroplasty were presented and reviewed. The risks due to aseptic loosening, infection, stiffness, dislocation/subluxation, thromboembolic complications and other imponderables were discussed. The patient acknowledged the explanation, agreed to proceed with the plan and consent was signed. Patient is being admitted for inpatient treatment for  surgery, pain control, PT, OT, prophylactic antibiotics, VTE prophylaxis, progressive ambulation and ADLs and discharge planning.The patient is planning to be discharged  home .   Patient's anticipated LOS is less than 2 midnights, meeting these requirements: - Lives within 1 hour of care - Has a competent adult at home to recover with post-op recover - NO history of  - Chronic pain requiring opiods  - Diabetes  - Coronary Artery Disease  - Heart failure  - Heart attack  - Stroke  - DVT/VTE  - Respiratory Failure/COPD  - Renal failure  - Anemia  - Advanced Liver disease  Therapy Plans: HEP Disposition: Home with husband Planned DVT Prophylaxis: Aspirin 325 mg BID DME Needed: None PCP: Jacquiline Doe, MD (last appt 12/21 - sent in blank clearance form) Cardiologist: Chilton Si, MD (appt 9/8 - notes will be in EPIC) TXA: IV Allergies: NKDA Anesthesia Concerns: Nausea/vomiting BMI: 23.7 Last HgbA1c: Not diabetic Pharmacy: [facility]  Other: - Hx LBBB - Hesitant about  narcotic medications, discussed hydrocodone and tramadol postoperatively. If not using the hydrocodone in the hospital, will not send home  - Patient was instructed on what medications to stop prior to surgery. - Follow-up visit in 2 weeks with Dr. Lequita Halt - Begin physical therapy following surgery - Pre-operative lab work as pre-surgical testing - Prescriptions will be provided in hospital at time of discharge  Arther Abbott, PA-C Orthopedic Surgery [facility] [place]

## 2020-09-20 ENCOUNTER — Other Ambulatory Visit: Payer: Self-pay

## 2020-09-20 DIAGNOSIS — E7800 Pure hypercholesterolemia, unspecified: Secondary | ICD-10-CM

## 2020-09-20 LAB — BASIC METABOLIC PANEL
BUN/Creatinine Ratio: 23 (ref 12–28)
BUN: 19 mg/dL (ref 8–27)
CO2: 24 mmol/L (ref 20–29)
Calcium: 9.6 mg/dL (ref 8.7–10.3)
Chloride: 104 mmol/L (ref 96–106)
Creatinine, Ser: 0.83 mg/dL (ref 0.57–1.00)
Glucose: 89 mg/dL (ref 65–99)
Potassium: 4.6 mmol/L (ref 3.5–5.2)
Sodium: 142 mmol/L (ref 134–144)
eGFR: 76 mL/min/{1.73_m2} (ref >59)

## 2020-09-20 LAB — HEPATIC FUNCTION PANEL
ALT: 24 IU/L (ref 0–32)
AST: 21 IU/L (ref 0–40)
Albumin: 4.9 g/dL — ABNORMAL HIGH (ref 3.8–4.8)
Alkaline Phosphatase: 83 IU/L (ref 44–121)
Bilirubin Total: 0.5 mg/dL (ref 0.0–1.2)
Bilirubin, Direct: 0.13 mg/dL (ref 0.00–0.40)
Total Protein: 7.1 g/dL (ref 6.0–8.5)

## 2020-09-20 LAB — LIPID PANEL
Chol/HDL Ratio: 3.4 ratio (ref 0.0–4.4)
Cholesterol, Total: 172 mg/dL (ref 100–199)
HDL: 50 mg/dL (ref >39)
LDL Chol Calc (NIH): 95 mg/dL (ref 0–99)
Triglycerides: 154 mg/dL — ABNORMAL HIGH (ref 0–149)
VLDL Cholesterol Cal: 27 mg/dL (ref 5–40)

## 2020-09-25 NOTE — Progress Notes (Signed)
DUE TO COVID-19 ONLY ONE VISITOR IS ALLOWED TO COME WITH YOU AND STAY IN THE WAITING ROOM ONLY DURING PRE OP AND PROCEDURE DAY OF SURGERY.  2 VISITOR  MAY VISIT WITH YOU AFTER SURGERY IN YOUR PRIVATE ROOM DURING VISITING HOURS ONLY!  YOU NEED TO HAVE A COVID 19 TEST ON__9/19/2022 ____@_  @_from  8am-3pm _____, THIS TEST MUST BE DONE BEFORE SURGERY,  Covid test is done at [address] [place], [place] [address].  This is a drive thru.  No appt required. Please see map.                 Your procedure is scheduled on:  10/09/20   Report to [facility] Main  Entrance   Report to admitting at  0905 AM     Call this number if you have problems the morning of surgery [phone]    REMEMBER: NO  SOLID FOOD CANDY OR GUM AFTER MIDNIGHT. CLEAR LIQUIDS UNTIL   0850am       . NOTHING BY MOUTH EXCEPT CLEAR LIQUIDS UNTIL 0850am    . PLEASE FINISH ENSURE DRINK PER SURGEON ORDER  WHICH NEEDS TO BE COMPLETED AT   0850am    .      CLEAR LIQUID DIET   Foods Allowed                                                                    Coffee and tea, regular and decaf                            Fruit ices (not with fruit pulp)                                      Iced Popsicles                                    Carbonated beverages, regular and diet                                    Cranberry, grape and apple juices Sports drinks like Gatorade Lightly seasoned clear broth or consume(fat free) Sugar, honey syrup ___________________________________________________________________      BRUSH YOUR TEETH MORNING OF SURGERY AND RINSE YOUR MOUTH OUT, NO CHEWING GUM CANDY OR MINTS.     Take these medicines the morning of surgery with A SIP OF WATER:     DO NOT TAKE ANY DIABETIC MEDICATIONS DAY OF YOUR SURGERY                               You may not have any metal on your body including hair pins and              piercings  Do not wear jewelry, make-up, lotions, powders or perfumes,  deodorant             Do not wear nail polish  on your fingernails.  Do not shave  48 hours prior to surgery.              Men may shave face and neck.   Do not bring valuables to the hospital. [place].  Contacts, dentures or bridgework may not be worn into surgery.  Leave suitcase in the car. After surgery it may be brought to your room.     Patients discharged the day of surgery will not be allowed to drive home. IF YOU ARE HAVING SURGERY AND GOING HOME THE SAME DAY, YOU MUST HAVE AN ADULT TO DRIVE YOU HOME AND BE WITH YOU FOR 24 HOURS. YOU MAY GO HOME BY TAXI OR UBER OR ORTHERWISE, BUT AN ADULT MUST ACCOMPANY YOU HOME AND STAY WITH YOU FOR 24 HOURS.  Name and phone number of your driver:  Special Instructions: N/A              Please read over the following fact sheets you were given: _____________________________________________________________________  [facility] - Preparing for Surgery Before surgery, you can play an important role.  Because skin is not sterile, your skin needs to be as free of germs as possible.  You can reduce the number of germs on your skin by washing with CHG (chlorahexidine gluconate) soap before surgery.  CHG is an antiseptic cleaner which kills germs and bonds with the skin to continue killing germs even after washing. Please DO NOT use if you have an allergy to CHG or antibacterial soaps.  If your skin becomes reddened/irritated stop using the CHG and inform your nurse when you arrive at Short Stay. Do not shave (including legs and underarms) for at least 48 hours prior to the first CHG shower.  You may shave your face/neck. Please follow these instructions carefully:  1.  Shower with CHG Soap the night before surgery and the  morning of Surgery.  2.  If you choose to wash your hair, wash your hair first as usual with your  normal  shampoo.  3.  After you shampoo, rinse your hair and body thoroughly to remove  the  shampoo.                           4.  Use CHG as you would any other liquid soap.  You can apply chg directly  to the skin and wash                       Gently with a scrungie or clean washcloth.  5.  Apply the CHG Soap to your body ONLY FROM THE NECK DOWN.   Do not use on face/ open                           Wound or open sores. Avoid contact with eyes, ears mouth and genitals (private parts).                       Wash face,  Genitals (private parts) with your normal soap.             6.  Wash thoroughly, paying special attention to the area where your surgery  will be performed.  7.  Thoroughly rinse your body with warm water from the neck  down.  8.  DO NOT shower/wash with your normal soap after using and rinsing off  the CHG Soap.                9.  Pat yourself dry with a clean towel.            10.  Wear clean pajamas.            11.  Place clean sheets on your bed the night of your first shower and do not  sleep with pets. Day of Surgery : Do not apply any lotions/deodorants the morning of surgery.  Please wear clean clothes to the hospital/surgery center.  FAILURE TO FOLLOW THESE INSTRUCTIONS MAY RESULT IN THE CANCELLATION OF YOUR SURGERY PATIENT SIGNATURE_________________________________  NURSE SIGNATURE__________________________________  ________________________________________________________________________

## 2020-09-26 ENCOUNTER — Other Ambulatory Visit: Payer: Self-pay

## 2020-09-26 ENCOUNTER — Ambulatory Visit (INDEPENDENT_AMBULATORY_CARE_PROVIDER_SITE_OTHER): Payer: Medicare Other | Admitting: Cardiovascular Disease

## 2020-09-26 ENCOUNTER — Encounter (HOSPITAL_BASED_OUTPATIENT_CLINIC_OR_DEPARTMENT_OTHER): Payer: Self-pay | Admitting: Cardiovascular Disease

## 2020-09-26 ENCOUNTER — Encounter (HOSPITAL_BASED_OUTPATIENT_CLINIC_OR_DEPARTMENT_OTHER): Payer: Self-pay | Admitting: *Deleted

## 2020-09-26 VITALS — BP 134/84 | HR 58 | Ht 63.0 in | Wt 134.8 lb

## 2020-09-26 DIAGNOSIS — E7800 Pure hypercholesterolemia, unspecified: Secondary | ICD-10-CM | POA: Diagnosis not present

## 2020-09-26 DIAGNOSIS — R030 Elevated blood-pressure reading, without diagnosis of hypertension: Secondary | ICD-10-CM

## 2020-09-26 DIAGNOSIS — I2510 Atherosclerotic heart disease of native coronary artery without angina pectoris: Secondary | ICD-10-CM | POA: Diagnosis not present

## 2020-09-26 HISTORY — DX: Elevated blood-pressure reading, without diagnosis of hypertension: R03.0

## 2020-09-26 HISTORY — DX: Atherosclerotic heart disease of native coronary artery without angina pectoris: I25.10

## 2020-09-26 NOTE — Assessment & Plan Note (Deleted)
Nonobstructive CAD on coronary CTA in 2019.

## 2020-09-26 NOTE — Progress Notes (Signed)
Cardiology Office Note   Date:  09/26/2020   ID:  Dawn Perry, DOB [birth year], MRN [id number]  PCP:  Ardith Dark, MD  Cardiologist:   Chilton Si, MD   Chief Complaint  Patient presents with   Medical Clearance    Hip Replacement 09/21     History of Present Illness: Dawn Perry is a 70 y.o. female with hyperlipidemia and LBBB who presents for follow up.  She was previosly a patient of Dawn Fredrickson, NP.  She had a nuclear stress test in 2004 at the time when she was first diagnosed with left bundle branch block.  This was negative for ischemia.  She had an echo in 2012 that revealed LVEF 55% and mild mitral vegetation.  She has a history of vasovagal presyncope with blood draws.  She last saw Dawn Perry on 09/2019 and was doing well.  She had a core Neri CT 11/2017 consistent with nonobstructive three-vessel CAD.  FFR was negative.  Her calcium score was 357 which was 92nd percentile.  Dawn Perry is scheduled to have hip replacement later this month.  Overall she has felt well.  Typically she likes to walk for exercise but recently has been limited due to the hip.  She has no exertional chest pain or shortness of breath.  She walks up and down steps at her home without difficulty.  She has no lower extremity edema, orthopnea, or PND.  She denies any palpitations, lightheadedness, or dizziness.  She sometimes has some shortness of breath when going up hill but is otherwise well.  She previously had muscle aches on her statin but started a multivitamin and co-Q10 and now tolerates it well.  Overall her diet is healthy.  She plans to move back towards a plant-based diet.  She notes that at home her blood pressure has been running in the 130s to low 140s.  She does try to limit her sodium intake.   Past Medical History:  Diagnosis Date   CAD in native artery 09/26/2020   Elevated blood pressure reading 09/26/2020   GERD (gastroesophageal reflux disease)    B hands   H/O scarlet  fever    age 36   History of chicken pox    Hyperlipidemia    LBBB (left bundle branch block)    Osteoarthritis     Past Surgical History:  Procedure Laterality Date   Broke Left forearm     Right shoulder tear  1990     Current Outpatient Medications  Medication Sig Dispense Refill   aspirin EC 81 MG tablet Take 1 tablet (81 mg total) by mouth daily. 90 tablet 3   Coenzyme Q10 (CO Q 10 PO) Take by mouth.     Multiple Vitamin (MULTI-VITAMIN DAILY) TABS Take by mouth.     rosuvastatin (CRESTOR) 20 MG tablet Take 1 tablet (20 mg total) by mouth daily. 90 tablet 3   No current facility-administered medications for this visit.    Allergies:   Patient has no known allergies.    Social History:  The patient  reports that she has never smoked. She has never used smokeless tobacco. She reports that she does not currently use alcohol. She reports that she does not use drugs.   Family History:  The patient's family history includes Arthritis in her father and mother; Coronary artery disease in her brother; Hyperlipidemia in her mother; Other in her mother; Parkinson's disease in her father; Parkinsonism in an other family member;  Stroke in her mother.    ROS:  Please see the history of present illness.   Otherwise, review of systems are positive for none.   All other systems are reviewed and negative.    PHYSICAL EXAM: VS:  BP 134/84   Pulse (!) 58   Ht 5\' 3"  (1.6 m)   Wt 134 lb 12.8 oz (61.1 kg)   BMI 23.88 kg/m  , BMI Body mass index is 23.88 kg/m. GENERAL:  Well appearing HEENT:  Pupils equal round and reactive, fundi not visualized, oral mucosa unremarkable NECK:  No jugular venous distention, waveform within normal limits, carotid upstroke brisk and symmetric, no bruits, no thyromegaly LYMPHATICS:  No cervical adenopathy LUNGS:  Clear to auscultation bilaterally HEART:  RRR.  PMI not displaced or sustained,S1 and S2 within normal limits, no S3, no S4, no clicks, no rubs,  no murmurs ABD:  Flat, positive bowel sounds normal in frequency in pitch, no bruits, no rebound, no guarding, no midline pulsatile mass, no hepatomegaly, no splenomegaly EXT:  2 plus pulses throughout, no edema, no cyanosis no clubbing SKIN:  No rashes no nodules NEURO:  Cranial nerves II through XII grossly intact, motor grossly intact throughout PSYCH:  Cognitively intact, oriented to person place and time    EKG:  EKG is ordered today. The ekg ordered today demonstrates sinus rhythm.  Rate 58 bpm   Recent Labs: 10/17/2019: Hemoglobin 14.0; Platelets 206 09/20/2020: ALT 24; BUN 19; Creatinine, Ser 0.83; Potassium 4.6; Sodium 142    Lipid Panel    Component Value Date/Time   CHOL 172 09/20/2020 0933   TRIG 154 (H) 09/20/2020 0933   HDL 50 09/20/2020 0933   CHOLHDL 3.4 09/20/2020 0933   CHOLHDL 5.0 10/18/2015 0924   VLDL 31 (H) 10/18/2015 0924   LDLCALC 95 09/20/2020 0933   LDLDIRECT 132.0 02/15/2013 1006      Wt Readings from Last 3 Encounters:  09/26/20 134 lb 12.8 oz (61.1 kg)  12/29/19 137 lb (62.1 kg)  10/11/19 133 lb 9.6 oz (60.6 kg)      ASSESSMENT AND PLAN:  # Pre-surgical risk assessment:  The patient does not have any unstable cardiac conditions.  Upon evaluation today, she can achieve 4 METs or greater without anginal symptoms.  According to [facility] and AHA guidelines, she requires no further cardiac workup prior to her noncardiac surgery and should be at acceptable risk.  her NSQIP risk of peri-procedural MI or cardiac arrest is <1%.  Our service is available as necessary in the perioperative period.  Okay to hold aspirin perioperatively.  # Non-obstructive CAD:  # Hyperlipidemia: Nonobstructive CAD on cardiac CT in 2019.  She has no anginal symptoms.  We discussed the fact that her lipids are not at goal.  She wants to work on going back to a plant-based diet and increasing her exercise after her surgery.  We will recheck her fasting lipids in 6 months.  If her  LDL not less than 70 we will need to increase her statin.  Continue aspirin.  # Essential hypertension: Blood pressure is elevated today and has been elevated in the past.  Her goal is less than 130/80 given her CAD.  She is very mildly elevated and will work on diet and exercise prior to starting any antihypertensives.  Given her CAD would consider starting a beta-blocker if she needs a medicine in the future. Okay thank you for  Current medicines are reviewed at length with the patient today.  The  patient does not have concerns regarding medicines.  The following changes have been made:  no change  Labs/ tests ordered today include:   Orders Placed This Encounter  Procedures   Lipid panel   Comprehensive metabolic panel   EKG 12-Lead     Disposition:   FU with Kaynen Minner C. Duke Salvia, MD, [facility] in 6 months.      Signed, Icholas Irby C. Duke Salvia, MD, [facility]  09/26/2020 11:03 AM    [facility]

## 2020-09-26 NOTE — Patient Instructions (Signed)
Medication Instructions:  Your physician recommends that you continue on your current medications as directed. Please refer to the Current Medication list given to you today.   *If you need a refill on your cardiac medications before your next appointment, please call your pharmacy*  Lab Work: FASTING LP/CMET IN 6 MONTHS  If you have labs (blood work) drawn today and your tests are completely normal, you will receive your results only by: MyChart Message (if you have MyChart) OR A paper copy in the mail If you have any lab test that is abnormal or we need to change your treatment, we will call you to review the results.  Testing/Procedures: NONE  Follow-Up: At [facility], you and your health needs are our priority.  As part of our continuing mission to provide you with exceptional heart care, we have created designated Provider Care Teams.  These Care Teams include your primary Cardiologist (physician) and Advanced Practice Providers (APPs -  Physician Assistants and Nurse Practitioners) who all work together to provide you with the care you need, when you need it.  We recommend signing up for the patient portal called "MyChart".  Sign up information is provided on this After Visit Summary.  MyChart is used to connect with patients for Virtual Visits (Telemedicine).  Patients are able to view lab/test results, encounter notes, upcoming appointments, etc.  Non-urgent messages can be sent to your provider as well.   To learn more about what you can do with MyChart, go to [email or web address].    Your next appointment:   6 month(s) AFTER LABS   The format for your next appointment:   In Person  Provider:   Chilton Si, MD or Gillian Shields, NP

## 2020-10-01 ENCOUNTER — Other Ambulatory Visit: Payer: Self-pay

## 2020-10-01 ENCOUNTER — Encounter (HOSPITAL_COMMUNITY): Payer: Self-pay

## 2020-10-01 ENCOUNTER — Encounter (HOSPITAL_COMMUNITY)
Admission: RE | Admit: 2020-10-01 | Discharge: 2020-10-01 | Disposition: A | Discharge status: Home / Self Care | Payer: Medicare Other | Source: Ambulatory Visit | Attending: Orthopedic Surgery | Admitting: Orthopedic Surgery

## 2020-10-01 DIAGNOSIS — I2510 Atherosclerotic heart disease of native coronary artery without angina pectoris: Secondary | ICD-10-CM | POA: Diagnosis not present

## 2020-10-01 DIAGNOSIS — Z01812 Encounter for preprocedural laboratory examination: Secondary | ICD-10-CM | POA: Diagnosis not present

## 2020-10-01 DIAGNOSIS — I447 Left bundle-branch block, unspecified: Secondary | ICD-10-CM | POA: Diagnosis not present

## 2020-10-01 DIAGNOSIS — Z79899 Other long term (current) drug therapy: Secondary | ICD-10-CM | POA: Insufficient documentation

## 2020-10-01 DIAGNOSIS — M1611 Unilateral primary osteoarthritis, right hip: Secondary | ICD-10-CM | POA: Insufficient documentation

## 2020-10-01 DIAGNOSIS — Z7982 Long term (current) use of aspirin: Secondary | ICD-10-CM | POA: Insufficient documentation

## 2020-10-01 HISTORY — DX: Cardiac murmur, unspecified: R01.1

## 2020-10-01 HISTORY — DX: Nausea with vomiting, unspecified: R11.2

## 2020-10-01 HISTORY — DX: Family history of other specified conditions: Z84.89

## 2020-10-01 HISTORY — DX: Other specified postprocedural states: Z98.890

## 2020-10-01 LAB — COMPREHENSIVE METABOLIC PANEL
ALT: 21 U/L (ref 0–44)
AST: 21 U/L (ref 15–41)
Albumin: 4.7 g/dL (ref 3.5–5.0)
Alkaline Phosphatase: 63 U/L (ref 38–126)
Anion gap: 9 (ref 5–15)
BUN: 16 mg/dL (ref 8–23)
CO2: 24 mmol/L (ref 22–32)
Calcium: 9.3 mg/dL (ref 8.9–10.3)
Chloride: 104 mmol/L (ref 98–111)
Creatinine, Ser: 0.64 mg/dL (ref 0.44–1.00)
GFR, Estimated: >60 mL/min (ref >60)
Glucose, Bld: 99 mg/dL (ref 70–99)
Potassium: 4.1 mmol/L (ref 3.5–5.1)
Sodium: 137 mmol/L (ref 135–145)
Total Bilirubin: 0.8 mg/dL (ref 0.3–1.2)
Total Protein: 7.5 g/dL (ref 6.5–8.1)

## 2020-10-01 LAB — CBC
HCT: 42.3 % (ref 36.0–46.0)
Hemoglobin: 14.3 g/dL (ref 12.0–15.0)
MCH: 31.1 pg (ref 26.0–34.0)
MCHC: 33.8 g/dL (ref 30.0–36.0)
MCV: 92 fL (ref 80.0–100.0)
Platelets: 209 10*3/uL (ref 150–400)
RBC: 4.6 MIL/uL (ref 3.87–5.11)
RDW: 11.9 % (ref 11.5–15.5)
WBC: 6.4 10*3/uL (ref 4.0–10.5)
nRBC: 0 % (ref 0.0–0.2)

## 2020-10-01 LAB — SURGICAL PCR SCREEN
MRSA, PCR: NEGATIVE
Staphylococcus aureus: POSITIVE — AB

## 2020-10-01 NOTE — Progress Notes (Addendum)
Anesthesia Review:  PCP: DR Jacquiline Doe  Cardiologist : DR Chilton Si LOV 09/26/20  clearance in note  Chest x-ray : CT [facility]- 2019  EKG : 09/26/20  Echo : Stress test: Cardiac Cath :  Activity level: can do a flight of stairs without difficulty  Sleep Study/ CPAP : none  Fasting Blood Sugar :      / Checks Blood Sugar -- times a day:   Blood Thinner/ Instructions /Last Dose: ASA / Instructions/ Last Dose :   81 mg aspirin  Covid test- 10/07/2020  Pt reports at preop that she " gets fainty" when blood drawn.  PT placed in recliner for blood draw .  Fan on pt .  PT tolerated well.  Blood pressure after blood draw was 132/64 pulse- 57.  Pt states she feels fine.  PT given bottled water.

## 2020-10-02 NOTE — Anesthesia Preprocedure Evaluation (Addendum)
Anesthesia Evaluation  Patient identified by MRN, date of birth, ID band Patient awake    Reviewed: Allergy & Precautions, NPO status , Patient's Chart, lab work & pertinent test results  History of Anesthesia Complications (+) PONV, Family history of anesthesia reaction and history of anesthetic complications  Airway Mallampati: II  TM Distance: >3 FB Neck ROM: Full    Dental  (+) Teeth Intact, Dental Advisory Given, Caps   Pulmonary neg pulmonary ROS,    Pulmonary exam normal breath sounds clear to auscultation       Cardiovascular + CAD  Normal cardiovascular exam+ dysrhythmias + Valvular Problems/Murmurs  Rhythm:Regular Rate:Normal  Non obstructive CAD  Hx/o LBBB   Neuro/Psych negative neurological ROS  negative psych ROS   GI/Hepatic Neg liver ROS, GERD  Medicated and Controlled,  Endo/Other  Hyperlipidemia  Renal/GU negative Renal ROS  negative genitourinary   Musculoskeletal  (+) Arthritis , Osteoarthritis,  Right Hip OA   Abdominal   Peds  Hematology negative hematology ROS (+)   Anesthesia Other Findings   Reproductive/Obstetrics                            Anesthesia Physical Anesthesia Plan  ASA: 2  Anesthesia Plan: General   Post-op Pain Management:    Induction: Intravenous  PONV Risk Score and Plan: 4 or greater and Treatment may vary due to age or medical condition  Airway Management Planned: Oral ETT  Additional Equipment:   Intra-op Plan:   Post-operative Plan: Extubation in OR  Informed Consent: I have reviewed the patients History and Physical, chart, labs and discussed the procedure including the risks, benefits and alternatives for the proposed anesthesia with the patient or authorized representative who has indicated his/her understanding and acceptance.     Dental advisory given  Plan Discussed with: CRNA and Anesthesiologist  Anesthesia Plan  Comments: (See PAT note 10/01/2020, Jodell Cipro Ward, PA-C  Risks/Benefits and Alternatives of GA vs RA explained to patient. Patient does not want SAB. Will proceed with GETA.)      Anesthesia Quick Evaluation

## 2020-10-02 NOTE — Progress Notes (Signed)
Anesthesia Chart Review   Case: [id number] Date/Time: 10/09/20 1135   Procedure: TOTAL HIP ARTHROPLASTY ANTERIOR APPROACH (Right: Hip)   Anesthesia type: Choice   Pre-op diagnosis: Right hip osteoarthritis   Location: [facility] ROOM 10 / [facility]   Surgeons: Ollen Gross, MD       DISCUSSION:70 y.o. never smoker with h/o PONV, GERD, CAD (Nonobstructive CAD on cardiac CT in 2019), LBBB, right hip OA scheduled for above procedure 10/09/2020 with Dr. Ollen Gross.   Pt seen by cardiology 09/26/2020. Per OV note, "The patient does not have any unstable cardiac conditions.  Upon evaluation today, she can achieve 4 METs or greater without anginal symptoms.  According to [facility] and AHA guidelines, she requires no further cardiac workup prior to her noncardiac surgery and should be at acceptable risk.  her NSQIP risk of peri-procedural MI or cardiac arrest is <1%.  Our service is available as necessary in the perioperative period.  Okay to hold aspirin perioperatively."  Anticipate pt can proceed with planned procedure barring acute status change.   VS: BP (!) 145/51   Pulse 60   Temp 36.7 C (Oral)   Resp 16   Ht 5\' 3"  (1.6 m)   Wt 63 kg   SpO2 100%   BMI 24.62 kg/m   PROVIDERS: , MD is PCP   Ardith Dark, MD is Cardiologist  LABS: Labs reviewed: Acceptable for surgery. (all labs ordered are listed, but only abnormal results are displayed)  Labs Reviewed  SURGICAL PCR SCREEN - Abnormal; Notable for the following components:      Result Value   Staphylococcus aureus POSITIVE (*)    All other components within normal limits  CBC  COMPREHENSIVE METABOLIC PANEL  TYPE AND SCREEN     IMAGES:   EKG: 09/26/2020 Rate 58 bpm  Sinus bradycardia   CV:  Past Medical History:  Diagnosis Date   CAD in native artery 09/26/2020   Elevated blood pressure reading 09/26/2020   Family history of adverse reaction to anesthesia    mother had problems with N/V   GERD  (gastroesophageal reflux disease)    B hands   H/O scarlet fever    age 63   Heart murmur    History of chicken pox    Hyperlipidemia    LBBB (left bundle branch block)    Osteoarthritis    PONV (postoperative nausea and vomiting)     Past Surgical History:  Procedure Laterality Date   Broke Left forearm     fracture left arm surgery      right shoulder surgery      Right shoulder tear  01/20/1988    MEDICATIONS:  acetaminophen (TYLENOL) 500 MG tablet   aspirin EC 81 MG tablet   Multiple Vitamin (MULTI-VITAMIN DAILY) TABS   rosuvastatin (CRESTOR) 20 MG tablet   No current facility-administered medications for this encounter.     03/19/1988 Ward, PA-C [facility] Pre-Surgical Testing [phone]

## 2020-10-07 ENCOUNTER — Other Ambulatory Visit: Payer: Self-pay | Admitting: Orthopedic Surgery

## 2020-10-08 LAB — SARS CORONAVIRUS 2 (TAT 6-24 HRS): SARS Coronavirus 2: NEGATIVE

## 2020-10-09 ENCOUNTER — Ambulatory Visit (HOSPITAL_COMMUNITY): Payer: Medicare Other

## 2020-10-09 ENCOUNTER — Encounter (HOSPITAL_COMMUNITY)
Admission: RE | Disposition: A | Discharge status: Home / Self Care | Payer: Self-pay | Source: Home / Self Care | Attending: Orthopedic Surgery

## 2020-10-09 ENCOUNTER — Observation Stay (HOSPITAL_COMMUNITY)
Admission: RE | Admit: 2020-10-09 | Discharge: 2020-10-10 | Disposition: A | Discharge status: Home / Self Care | Payer: Medicare Other | Attending: Orthopedic Surgery | Admitting: Orthopedic Surgery

## 2020-10-09 ENCOUNTER — Ambulatory Visit (HOSPITAL_COMMUNITY): Payer: Medicare Other | Admitting: Certified Registered Nurse Anesthetist

## 2020-10-09 ENCOUNTER — Observation Stay (HOSPITAL_COMMUNITY): Payer: Medicare Other

## 2020-10-09 ENCOUNTER — Ambulatory Visit (HOSPITAL_COMMUNITY): Payer: Medicare Other | Admitting: Physician Assistant

## 2020-10-09 ENCOUNTER — Encounter (HOSPITAL_COMMUNITY): Payer: Self-pay | Admitting: Orthopedic Surgery

## 2020-10-09 ENCOUNTER — Other Ambulatory Visit: Payer: Self-pay

## 2020-10-09 DIAGNOSIS — M169 Osteoarthritis of hip, unspecified: Secondary | ICD-10-CM | POA: Diagnosis present

## 2020-10-09 DIAGNOSIS — I447 Left bundle-branch block, unspecified: Secondary | ICD-10-CM | POA: Diagnosis not present

## 2020-10-09 DIAGNOSIS — Z7982 Long term (current) use of aspirin: Secondary | ICD-10-CM | POA: Diagnosis not present

## 2020-10-09 DIAGNOSIS — M1990 Unspecified osteoarthritis, unspecified site: Secondary | ICD-10-CM | POA: Diagnosis present

## 2020-10-09 DIAGNOSIS — I2510 Atherosclerotic heart disease of native coronary artery without angina pectoris: Secondary | ICD-10-CM | POA: Insufficient documentation

## 2020-10-09 DIAGNOSIS — Z96641 Presence of right artificial hip joint: Secondary | ICD-10-CM | POA: Diagnosis not present

## 2020-10-09 DIAGNOSIS — K219 Gastro-esophageal reflux disease without esophagitis: Secondary | ICD-10-CM | POA: Diagnosis not present

## 2020-10-09 DIAGNOSIS — M1611 Unilateral primary osteoarthritis, right hip: Principal | ICD-10-CM | POA: Insufficient documentation

## 2020-10-09 DIAGNOSIS — Z419 Encounter for procedure for purposes other than remedying health state, unspecified: Secondary | ICD-10-CM

## 2020-10-09 DIAGNOSIS — E785 Hyperlipidemia, unspecified: Secondary | ICD-10-CM | POA: Diagnosis not present

## 2020-10-09 DIAGNOSIS — Z96649 Presence of unspecified artificial hip joint: Secondary | ICD-10-CM

## 2020-10-09 DIAGNOSIS — Z471 Aftercare following joint replacement surgery: Secondary | ICD-10-CM | POA: Diagnosis not present

## 2020-10-09 HISTORY — PX: TOTAL HIP ARTHROPLASTY: SHX124

## 2020-10-09 LAB — TYPE AND SCREEN
ABO/RH(D): A POS
Antibody Screen: NEGATIVE

## 2020-10-09 LAB — ABO/RH: ABO/RH(D): A POS

## 2020-10-09 SURGERY — ARTHROPLASTY, HIP, TOTAL, ANTERIOR APPROACH
Anesthesia: General | Site: Hip | Laterality: Right

## 2020-10-09 MED ORDER — HYDROMORPHONE HCL 1 MG/ML IJ SOLN
INTRAMUSCULAR | Status: AC
  Administered 2020-10-09: 0.5 mg via INTRAVENOUS
  Filled 2020-10-09: qty 1

## 2020-10-09 MED ORDER — WATER FOR IRRIGATION, STERILE IR SOLN
Status: DC | PRN
  Administered 2020-10-09: 2000 mL

## 2020-10-09 MED ORDER — DEXAMETHASONE SODIUM PHOSPHATE 10 MG/ML IJ SOLN
INTRAMUSCULAR | Status: DC | PRN
  Administered 2020-10-09: 10 mg via INTRAVENOUS

## 2020-10-09 MED ORDER — ACETAMINOPHEN 325 MG PO TABS
325.0000 mg | ORAL_TABLET | Freq: Four times a day (QID) | ORAL | Status: DC | PRN
  Administered 2020-10-10: 650 mg via ORAL
  Filled 2020-10-09: qty 2

## 2020-10-09 MED ORDER — BUPIVACAINE HCL (PF) 0.25 % IJ SOLN
INTRAMUSCULAR | Status: AC
  Filled 2020-10-09: qty 30

## 2020-10-09 MED ORDER — ASPIRIN EC 325 MG PO TBEC
325.0000 mg | DELAYED_RELEASE_TABLET | Freq: Two times a day (BID) | ORAL | Status: DC
  Administered 2020-10-10: 325 mg via ORAL
  Filled 2020-10-09: qty 1

## 2020-10-09 MED ORDER — FENTANYL CITRATE (PF) 250 MCG/5ML IJ SOLN
INTRAMUSCULAR | Status: AC
  Filled 2020-10-09: qty 5

## 2020-10-09 MED ORDER — SODIUM CHLORIDE 0.9 % IV SOLN: INTRAVENOUS | Status: DC

## 2020-10-09 MED ORDER — FENTANYL CITRATE PF 50 MCG/ML IJ SOSY: 25.0000 ug | PREFILLED_SYRINGE | INTRAMUSCULAR | Status: DC | PRN

## 2020-10-09 MED ORDER — TRANEXAMIC ACID-NACL 1000-0.7 MG/100ML-% IV SOLN
1000.0000 mg | INTRAVENOUS | Status: DC
  Filled 2020-10-09: qty 100

## 2020-10-09 MED ORDER — 0.9 % SODIUM CHLORIDE (POUR BTL) OPTIME
TOPICAL | Status: DC | PRN
  Administered 2020-10-09: 1000 mL

## 2020-10-09 MED ORDER — METOCLOPRAMIDE HCL 5 MG PO TABS
5.0000 mg | ORAL_TABLET | Freq: Three times a day (TID) | ORAL | Status: DC | PRN
  Filled 2020-10-09: qty 2

## 2020-10-09 MED ORDER — ORAL CARE MOUTH RINSE: 15.0000 mL | Freq: Once | OROMUCOSAL | Status: AC

## 2020-10-09 MED ORDER — BUPIVACAINE HCL 0.25 % IJ SOLN
INTRAMUSCULAR | Status: DC | PRN
  Administered 2020-10-09: 30 mL

## 2020-10-09 MED ORDER — LIDOCAINE 2% (20 MG/ML) 5 ML SYRINGE
INTRAMUSCULAR | Status: DC | PRN
  Administered 2020-10-09: 60 mg via INTRAVENOUS

## 2020-10-09 MED ORDER — PHENYLEPHRINE 40 MCG/ML (10ML) SYRINGE FOR IV PUSH (FOR BLOOD PRESSURE SUPPORT)
PREFILLED_SYRINGE | INTRAVENOUS | Status: AC
  Filled 2020-10-09: qty 10

## 2020-10-09 MED ORDER — BISACODYL 10 MG RE SUPP: 10.0000 mg | Freq: Every day | RECTAL | Status: DC | PRN

## 2020-10-09 MED ORDER — LACTATED RINGERS IV SOLN: INTRAVENOUS | Status: DC

## 2020-10-09 MED ORDER — CEFAZOLIN SODIUM-DEXTROSE 2-4 GM/100ML-% IV SOLN
2.0000 g | Freq: Four times a day (QID) | INTRAVENOUS | Status: AC
  Administered 2020-10-09 (×2): 2 g via INTRAVENOUS
  Filled 2020-10-09 (×2): qty 100

## 2020-10-09 MED ORDER — ONDANSETRON HCL 4 MG/2ML IJ SOLN: 4.0000 mg | Freq: Once | INTRAMUSCULAR | Status: DC | PRN

## 2020-10-09 MED ORDER — MIDAZOLAM HCL 5 MG/5ML IJ SOLN
INTRAMUSCULAR | Status: DC | PRN
  Administered 2020-10-09: 2 mg via INTRAVENOUS

## 2020-10-09 MED ORDER — METHOCARBAMOL 500 MG PO TABS
500.0000 mg | ORAL_TABLET | Freq: Four times a day (QID) | ORAL | Status: DC | PRN
  Administered 2020-10-09 – 2020-10-10 (×2): 500 mg via ORAL
  Filled 2020-10-09 (×2): qty 1

## 2020-10-09 MED ORDER — HYDROMORPHONE HCL 1 MG/ML IJ SOLN: 0.2500 mg | INTRAMUSCULAR | Status: DC | PRN

## 2020-10-09 MED ORDER — HYDROCODONE-ACETAMINOPHEN 5-325 MG PO TABS
1.0000 | ORAL_TABLET | ORAL | Status: DC | PRN
  Administered 2020-10-09 – 2020-10-10 (×3): 1 via ORAL
  Filled 2020-10-09 (×3): qty 1

## 2020-10-09 MED ORDER — ROCURONIUM BROMIDE 10 MG/ML (PF) SYRINGE
PREFILLED_SYRINGE | INTRAVENOUS | Status: AC
  Filled 2020-10-09: qty 10

## 2020-10-09 MED ORDER — MORPHINE SULFATE (PF) 2 MG/ML IV SOLN: 0.5000 mg | INTRAVENOUS | Status: DC | PRN

## 2020-10-09 MED ORDER — MENTHOL 3 MG MT LOZG: 1.0000 | LOZENGE | OROMUCOSAL | Status: DC | PRN

## 2020-10-09 MED ORDER — EPHEDRINE 5 MG/ML INJ
INTRAVENOUS | Status: AC
  Filled 2020-10-09: qty 15

## 2020-10-09 MED ORDER — DEXAMETHASONE SODIUM PHOSPHATE 10 MG/ML IJ SOLN
INTRAMUSCULAR | Status: AC
  Filled 2020-10-09: qty 1

## 2020-10-09 MED ORDER — SUGAMMADEX SODIUM 200 MG/2ML IV SOLN
INTRAVENOUS | Status: DC | PRN
  Administered 2020-10-09: 200 mg via INTRAVENOUS

## 2020-10-09 MED ORDER — TRAMADOL HCL 50 MG PO TABS: 50.0000 mg | ORAL_TABLET | Freq: Four times a day (QID) | ORAL | Status: DC | PRN

## 2020-10-09 MED ORDER — PHENOL 1.4 % MT LIQD: 1.0000 | OROMUCOSAL | Status: DC | PRN

## 2020-10-09 MED ORDER — METOCLOPRAMIDE HCL 5 MG/ML IJ SOLN: 5.0000 mg | Freq: Three times a day (TID) | INTRAMUSCULAR | Status: DC | PRN

## 2020-10-09 MED ORDER — DEXAMETHASONE SODIUM PHOSPHATE 10 MG/ML IJ SOLN
10.0000 mg | Freq: Once | INTRAMUSCULAR | Status: AC
  Administered 2020-10-10: 10 mg via INTRAVENOUS
  Filled 2020-10-09: qty 1

## 2020-10-09 MED ORDER — METHOCARBAMOL 500 MG IVPB - SIMPLE MED
500.0000 mg | Freq: Four times a day (QID) | INTRAVENOUS | Status: DC | PRN
  Filled 2020-10-09: qty 50

## 2020-10-09 MED ORDER — PROPOFOL 10 MG/ML IV BOLUS
INTRAVENOUS | Status: AC
  Filled 2020-10-09: qty 20

## 2020-10-09 MED ORDER — ONDANSETRON HCL 4 MG/2ML IJ SOLN
INTRAMUSCULAR | Status: DC | PRN
  Administered 2020-10-09: 4 mg via INTRAVENOUS

## 2020-10-09 MED ORDER — CHLORHEXIDINE GLUCONATE 0.12 % MT SOLN
15.0000 mL | Freq: Once | OROMUCOSAL | Status: AC
  Administered 2020-10-09: 15 mL via OROMUCOSAL

## 2020-10-09 MED ORDER — ONDANSETRON HCL 4 MG/2ML IJ SOLN
INTRAMUSCULAR | Status: AC
  Filled 2020-10-09: qty 2

## 2020-10-09 MED ORDER — ACETAMINOPHEN 10 MG/ML IV SOLN
1000.0000 mg | Freq: Once | INTRAVENOUS | Status: DC
  Filled 2020-10-09: qty 100

## 2020-10-09 MED ORDER — ONDANSETRON HCL 4 MG PO TABS
4.0000 mg | ORAL_TABLET | Freq: Four times a day (QID) | ORAL | Status: DC | PRN
  Filled 2020-10-09: qty 1

## 2020-10-09 MED ORDER — ROSUVASTATIN CALCIUM 20 MG PO TABS
20.0000 mg | ORAL_TABLET | Freq: Every day | ORAL | Status: DC
  Administered 2020-10-10: 20 mg via ORAL
  Filled 2020-10-09: qty 2

## 2020-10-09 MED ORDER — METHOCARBAMOL 500 MG IVPB - SIMPLE MED
INTRAVENOUS | Status: AC
  Administered 2020-10-09: 500 mg via INTRAVENOUS
  Filled 2020-10-09: qty 50

## 2020-10-09 MED ORDER — PHENYLEPHRINE 40 MCG/ML (10ML) SYRINGE FOR IV PUSH (FOR BLOOD PRESSURE SUPPORT)
PREFILLED_SYRINGE | INTRAVENOUS | Status: DC | PRN
  Administered 2020-10-09 (×2): 120 ug via INTRAVENOUS
  Administered 2020-10-09: 80 ug via INTRAVENOUS

## 2020-10-09 MED ORDER — POLYETHYLENE GLYCOL 3350 17 G PO PACK: 17.0000 g | PACK | Freq: Every day | ORAL | Status: DC | PRN

## 2020-10-09 MED ORDER — DOCUSATE SODIUM 100 MG PO CAPS
100.0000 mg | ORAL_CAPSULE | Freq: Two times a day (BID) | ORAL | Status: DC
  Administered 2020-10-09 – 2020-10-10 (×2): 100 mg via ORAL
  Filled 2020-10-09 (×2): qty 1

## 2020-10-09 MED ORDER — ROCURONIUM BROMIDE 10 MG/ML (PF) SYRINGE
PREFILLED_SYRINGE | INTRAVENOUS | Status: DC | PRN
  Administered 2020-10-09: 60 mg via INTRAVENOUS

## 2020-10-09 MED ORDER — EPHEDRINE SULFATE-NACL 50-0.9 MG/10ML-% IV SOSY
PREFILLED_SYRINGE | INTRAVENOUS | Status: DC | PRN
  Administered 2020-10-09: 10 mg via INTRAVENOUS

## 2020-10-09 MED ORDER — PROPOFOL 10 MG/ML IV BOLUS
INTRAVENOUS | Status: DC | PRN
  Administered 2020-10-09: 120 mg via INTRAVENOUS

## 2020-10-09 MED ORDER — FENTANYL CITRATE (PF) 100 MCG/2ML IJ SOLN
INTRAMUSCULAR | Status: DC | PRN
  Administered 2020-10-09: 150 ug via INTRAVENOUS

## 2020-10-09 MED ORDER — DEXAMETHASONE SODIUM PHOSPHATE 10 MG/ML IJ SOLN: 8.0000 mg | Freq: Once | INTRAMUSCULAR | Status: DC

## 2020-10-09 MED ORDER — MIDAZOLAM HCL 2 MG/2ML IJ SOLN
INTRAMUSCULAR | Status: AC
  Filled 2020-10-09: qty 2

## 2020-10-09 MED ORDER — POVIDONE-IODINE 10 % EX SWAB
2.0000 "application " | Freq: Once | CUTANEOUS | Status: AC
  Administered 2020-10-09: 2 via TOPICAL

## 2020-10-09 MED ORDER — CEFAZOLIN SODIUM-DEXTROSE 2-4 GM/100ML-% IV SOLN
2.0000 g | INTRAVENOUS | Status: DC
  Filled 2020-10-09: qty 100

## 2020-10-09 MED ORDER — ONDANSETRON HCL 4 MG/2ML IJ SOLN: 4.0000 mg | Freq: Four times a day (QID) | INTRAMUSCULAR | Status: DC | PRN

## 2020-10-09 SURGICAL SUPPLY — 44 items
BAG COUNTER SPONGE SURGICOUNT (BAG) IMPLANT
BAG DECANTER FOR FLEXI CONT (MISCELLANEOUS) IMPLANT
BAG SPEC THK2 15X12 ZIP CLS (MISCELLANEOUS)
BAG SPNG CNTER NS LX DISP (BAG)
BAG ZIPLOCK 12X15 (MISCELLANEOUS) IMPLANT
BLADE SAG 18X100X1.27 (BLADE) ×2 IMPLANT
COVER PERINEAL POST (MISCELLANEOUS) ×2 IMPLANT
COVER SURGICAL LIGHT HANDLE (MISCELLANEOUS) ×2 IMPLANT
CUP ACET PINNACLE SECTR 50MM (Hips) IMPLANT
DECANTER SPIKE VIAL GLASS SM (MISCELLANEOUS) ×2 IMPLANT
DRAPE FOOT SWITCH (DRAPES) ×2 IMPLANT
DRAPE STERI IOBAN 125X83 (DRAPES) ×2 IMPLANT
DRAPE U-SHAPE 47X51 STRL (DRAPES) ×4 IMPLANT
DRSG AQUACEL AG ADV 3.5X10 (GAUZE/BANDAGES/DRESSINGS) ×2 IMPLANT
DURAPREP 26ML APPLICATOR (WOUND CARE) ×2 IMPLANT
ELECT REM PT RETURN 15FT ADLT (MISCELLANEOUS) ×2 IMPLANT
GLOVE SRG 8 PF TXTR STRL LF DI (GLOVE) ×1 IMPLANT
GLOVE SURG ENC MOIS LTX SZ6.5 (GLOVE) ×2 IMPLANT
GLOVE SURG ENC MOIS LTX SZ7 (GLOVE) ×2 IMPLANT
GLOVE SURG ENC MOIS LTX SZ8 (GLOVE) ×4 IMPLANT
GLOVE SURG UNDER POLY LF SZ7 (GLOVE) ×2 IMPLANT
GLOVE SURG UNDER POLY LF SZ8 (GLOVE) ×2
GLOVE SURG UNDER POLY LF SZ8.5 (GLOVE) IMPLANT
GOWN STRL REUS W/TWL LRG LVL3 (GOWN DISPOSABLE) ×4 IMPLANT
GOWN STRL REUS W/TWL XL LVL3 (GOWN DISPOSABLE) IMPLANT
HEAD FEMORAL 32 CERAMIC (Hips) ×1 IMPLANT
HOLDER FOLEY CATH W/STRAP (MISCELLANEOUS) ×2 IMPLANT
KIT TURNOVER KIT A (KITS) ×2 IMPLANT
LINER MARATHON 32 50 (Hips) ×2 IMPLANT
MANIFOLD NEPTUNE II (INSTRUMENTS) ×2 IMPLANT
PACK ANTERIOR HIP CUSTOM (KITS) ×2 IMPLANT
PENCIL SMOKE EVACUATOR COATED (MISCELLANEOUS) ×2 IMPLANT
PINNACLE SECTOR CUP 50MM (Hips) ×2 IMPLANT
STEM FEM SZ3 STD ACTIS (Stem) ×1 IMPLANT
STRIP CLOSURE SKIN 1/2X4 (GAUZE/BANDAGES/DRESSINGS) ×4 IMPLANT
SUT ETHIBOND NAB CT1 #1 30IN (SUTURE) ×2 IMPLANT
SUT MNCRL AB 4-0 PS2 18 (SUTURE) ×2 IMPLANT
SUT STRATAFIX 0 PDS 27 VIOLET (SUTURE) ×2
SUT VIC AB 2-0 CT1 27 (SUTURE) ×4
SUT VIC AB 2-0 CT1 TAPERPNT 27 (SUTURE) ×2 IMPLANT
SUTURE STRATFX 0 PDS 27 VIOLET (SUTURE) ×1 IMPLANT
SYR 50ML LL SCALE MARK (SYRINGE) IMPLANT
TRAY FOLEY MTR SLVR 16FR STAT (SET/KITS/TRAYS/PACK) ×2 IMPLANT
TUBE SUCTION HIGH CAP CLEAR NV (SUCTIONS) ×2 IMPLANT

## 2020-10-09 NOTE — Transfer of Care (Signed)
Immediate Anesthesia Transfer of Care Note  Patient: Dawn Perry  Procedure(s) Performed: Procedure(s): TOTAL HIP ARTHROPLASTY ANTERIOR APPROACH (Right)  Patient Location: PACU  Anesthesia Type:General  Level of Consciousness: Alert, Awake, Oriented  Airway & Oxygen Therapy: Patient Spontanous Breathing  Post-op Assessment: Report given to RN  Post vital signs: Reviewed and stable  Last Vitals:  Vitals:   10/09/20 0914  BP: (!) 148/69  Pulse: 69  Resp: 15  Temp: 36.8 C  SpO2: 100%    Complications: No apparent anesthesia complications

## 2020-10-09 NOTE — Interval H&P Note (Signed)
History and Physical Interval Note:  10/09/2020 9:54 AM  Jamse Mead  has presented today for surgery, with the diagnosis of Right hip osteoarthritis.  The various methods of treatment have been discussed with the patient and family. After consideration of risks, benefits and other options for treatment, the patient has consented to  Procedure(s): TOTAL HIP ARTHROPLASTY ANTERIOR APPROACH (Right) as a surgical intervention.  The patient's history has been reviewed, patient examined, no change in status, stable for surgery.  I have reviewed the patient's chart and labs.  Questions were answered to the patient's satisfaction.     Homero Fellers Gio Janoski

## 2020-10-09 NOTE — Plan of Care (Signed)
  Problem: Education: Goal: Knowledge of General Education information will improve Description: Including pain rating scale, medication(s)/side effects and non-pharmacologic comfort measures Outcome: Progressing   Problem: Activity: Goal: Risk for activity intolerance will decrease Outcome: Progressing   Problem: Nutrition: Goal: Adequate nutrition will be maintained Outcome: Progressing   Problem: Elimination: Goal: Will not experience complications related to bowel motility Outcome: Progressing   Problem: Pain Managment: Goal: General experience of comfort will improve Outcome: Progressing   Problem: Safety: Goal: Ability to remain free from injury will improve Outcome: Progressing   Problem: Education: Goal: Knowledge of the prescribed therapeutic regimen will improve Outcome: Progressing Goal: Understanding of discharge needs will improve Outcome: Progressing Goal: Individualized Educational Video(s) Outcome: Progressing   Problem: Activity: Goal: Ability to avoid complications of mobility impairment will improve Outcome: Progressing Goal: Ability to tolerate increased activity will improve Outcome: Progressing   Problem: Pain Management: Goal: Pain level will decrease with appropriate interventions Outcome: Progressing

## 2020-10-09 NOTE — Anesthesia Postprocedure Evaluation (Signed)
Anesthesia Post Note  Patient: Dawn Perry  Procedure(s) Performed: TOTAL HIP ARTHROPLASTY ANTERIOR APPROACH (Right: Hip)     Patient location during evaluation: PACU Anesthesia Type: General Level of consciousness: awake and alert and oriented Pain management: pain level controlled Vital Signs Assessment: post-procedure vital signs reviewed and stable Respiratory status: spontaneous breathing, nonlabored ventilation and respiratory function stable Cardiovascular status: blood pressure returned to baseline and stable Postop Assessment: no apparent nausea or vomiting Anesthetic complications: no   No notable events documented.  Last Vitals:  Vitals:   10/09/20 1330 10/09/20 1345  BP: (!) 132/56 (!) 122/55  Pulse: 76 75  Resp: 19 19  Temp:    SpO2: 100% 100%    Last Pain:  Vitals:   10/09/20 1345  TempSrc:   PainSc: 5                  Francois Elk A.

## 2020-10-09 NOTE — Discharge Instructions (Signed)
Frank Aluisio, MD Total Joint Specialist [facility] [address] [place], [place] [address] [phone]  ANTERIOR APPROACH TOTAL HIP REPLACEMENT POSTOPERATIVE DIRECTIONS     Hip Rehabilitation, Guidelines Following Surgery  The results of a hip operation are greatly improved after range of motion and muscle strengthening exercises. Follow all safety measures which are given to protect your hip. If any of these exercises cause increased pain or swelling in your joint, decrease the amount until you are comfortable again. Then slowly increase the exercises. Call your caregiver if you have problems or questions.   BLOOD CLOT PREVENTION Take a 325 mg Aspirin two times a day for three weeks following surgery. Then resume one 81 mg Aspirin once a day. You may resume your vitamins/supplements upon discharge from the hospital. Do not take any NSAIDs (Advil, Aleve, Ibuprofen, Meloxicam, etc.) until you have discontinued the 325 mg Aspirin.  HOME CARE INSTRUCTIONS  Remove items at home which could result in a fall. This includes throw rugs or furniture in walking pathways.  ICE to the affected hip as frequently as 20-30 minutes an hour and then as needed for pain and swelling. Continue to use ice on the hip for pain and swelling from surgery. You may notice swelling that will progress down to the foot and ankle. This is normal after surgery. Elevate the leg when you are not up walking on it.   Continue to use the breathing machine which will help keep your temperature down.  It is common for your temperature to cycle up and down following surgery, especially at night when you are not up moving around and exerting yourself.  The breathing machine keeps your lungs expanded and your temperature down.  DIET You may resume your previous home diet once your are discharged from the hospital.  DRESSING / WOUND CARE / SHOWERING You have an adhesive waterproof bandage over the  incision. Leave this in place until your first follow-up appointment. Once you remove this you will not need to place another bandage.  You may begin showering 3 days following surgery, but do not submerge the incision under water.  ACTIVITY For the first 3-5 days, it is important to rest and keep the operative leg elevated. You should, as a general rule, rest for 50 minutes and walk/stretch for 10 minutes per hour. After 5 days, you may slowly increase activity as tolerated.  Perform the exercises you were provided twice a day for about 15-20 minutes each session. Begin these 2 days following surgery. Walk with your walker as instructed. Use the walker until you are comfortable transitioning to a cane. Walk with the cane in the opposite hand of the operative leg. You may discontinue the cane once you are comfortable and walking steadily. Avoid periods of inactivity such as sitting longer than an hour when not asleep. This helps prevent blood clots.  Do not drive a car for 6 weeks or until released by your surgeon.  Do not drive while taking narcotics.  TED HOSE STOCKINGS Wear the elastic stockings on both legs for three weeks following surgery during the day. You may remove them at night while sleeping.  WEIGHT BEARING Weight bearing as tolerated with assist device (walker, cane, etc) as directed, use it as long as suggested by your surgeon or therapist, typically at least 4-6 weeks.  POSTOPERATIVE CONSTIPATION PROTOCOL Constipation - defined medically as fewer than three stools per week and severe constipation as less than one stool per week.    One of the most common issues patients have following surgery is constipation.  Even if you have a regular bowel pattern at home, your normal regimen is likely to be disrupted due to multiple reasons following surgery.  Combination of anesthesia, postoperative narcotics, change in appetite and fluid intake all can affect your bowels.  In order to avoid  complications following surgery, here are some recommendations in order to help you during your recovery period.  Colace (docusate) - Pick up an over-the-counter form of Colace or another stool softener and take twice a day as long as you are requiring postoperative pain medications.  Take with a full glass of water daily.  If you experience loose stools or diarrhea, hold the colace until you stool forms back up.  If your symptoms do not get better within 1 week or if they get worse, check with your doctor. Dulcolax (bisacodyl) - Pick up over-the-counter and take as directed by the product packaging as needed to assist with the movement of your bowels.  Take with a full glass of water.  Use this product as needed if not relieved by Colace only.  MiraLax (polyethylene glycol) - Pick up over-the-counter to have on hand.  MiraLax is a solution that will increase the amount of water in your bowels to assist with bowel movements.  Take as directed and can mix with a glass of water, juice, soda, coffee, or tea.  Take if you go more than two days without a movement.Do not use MiraLax more than once per day. Call your doctor if you are still constipated or irregular after using this medication for 7 days in a row.  If you continue to have problems with postoperative constipation, please contact the office for further assistance and recommendations.  If you experience "the worst abdominal pain ever" or develop nausea or vomiting, please contact the office immediatly for further recommendations for treatment.  ITCHING  If you experience itching with your medications, try taking only a single pain pill, or even half a pain pill at a time.  You can also use Benadryl over the counter for itching or also to help with sleep.   MEDICATIONS See your medication summary on the "After Visit Summary" that the nursing staff will review with you prior to discharge.  You may have some home medications which will be placed on  hold until you complete the course of blood thinner medication.  It is important for you to complete the blood thinner medication as prescribed by your surgeon.  Continue your approved medications as instructed at time of discharge.  PRECAUTIONS If you experience chest pain or shortness of breath - call 911 immediately for transfer to the hospital emergency department.  If you develop a fever greater that 101 F, purulent drainage from wound, increased redness or drainage from wound, foul odor from the wound/dressing, or calf pain - CONTACT YOUR SURGEON.                                                   FOLLOW-UP APPOINTMENTS Make sure you keep all of your appointments after your operation with your surgeon and caregivers. You should call the office at the above phone number and make an appointment for approximately two weeks after the date of your surgery or on the date instructed by your surgeon outlined in   the "After Visit Summary".  RANGE OF MOTION AND STRENGTHENING EXERCISES  These exercises are designed to help you keep full movement of your hip joint. Follow your caregiver's or physical therapist's instructions. Perform all exercises about fifteen times, three times per day or as directed. Exercise both hips, even if you have had only one joint replacement. These exercises can be done on a training (exercise) mat, on the floor, on a table or on a bed. Use whatever works the best and is most comfortable for you. Use music or television while you are exercising so that the exercises are a pleasant break in your day. This will make your life better with the exercises acting as a break in routine you can look forward to.  Lying on your back, slowly slide your foot toward your buttocks, raising your knee up off the floor. Then slowly slide your foot back down until your leg is straight again.  Lying on your back spread your legs as far apart as you can without causing discomfort.  Lying on your side,  raise your upper leg and foot straight up from the floor as far as is comfortable. Slowly lower the leg and repeat.  Lying on your back, tighten up the muscle in the front of your thigh (quadriceps muscles). You can do this by keeping your leg straight and trying to raise your heel off the floor. This helps strengthen the largest muscle supporting your knee.  Lying on your back, tighten up the muscles of your buttocks both with the legs straight and with the knee bent at a comfortable angle while keeping your heel on the floor.   POST-OPERATIVE OPIOID TAPER INSTRUCTIONS: It is important to wean off of your opioid medication as soon as possible. If you do not need pain medication after your surgery it is ok to stop day one. Opioids include: Codeine, Hydrocodone(Norco, Vicodin), Oxycodone(Percocet, oxycontin) and hydromorphone amongst others.  Long term and even short term use of opiods can cause: Increased pain response Dependence Constipation Depression Respiratory depression And more.  Withdrawal symptoms can include Flu like symptoms Nausea, vomiting And more Techniques to manage these symptoms Hydrate well Eat regular healthy meals Stay active Use relaxation techniques(deep breathing, meditating, yoga) Do Not substitute Alcohol to help with tapering If you have been on opioids for less than two weeks and do not have pain than it is ok to stop all together.  Plan to wean off of opioids This plan should start within one week post op of your joint replacement. Maintain the same interval or time between taking each dose and first decrease the dose.  Cut the total daily intake of opioids by one tablet each day Next start to increase the time between doses. The last dose that should be eliminated is the evening dose.   IF YOU ARE TRANSFERRED TO A SKILLED REHAB FACILITY If the patient is transferred to a skilled rehab facility following release from the hospital, a list of the current  medications will be sent to the facility for the patient to continue.  When discharged from the skilled rehab facility, please have the facility set up the patient's Home Health Physical Therapy prior to being released. Also, the skilled facility will be responsible for providing the patient with their medications at time of release from the facility to include their pain medication, the muscle relaxants, and their blood thinner medication. If the patient is still at the rehab facility at time of the two week follow   up appointment, the skilled rehab facility will also need to assist the patient in arranging follow up appointment in our office and any transportation needs.  MAKE SURE YOU:  Understand these instructions.  Get help right away if you are not doing well or get worse.    DENTAL ANTIBIOTICS:  In most cases prophylactic antibiotics for Dental procdeures after total joint surgery are not necessary.  Exceptions are as follows:  1. History of prior total joint infection  2. Severely immunocompromised (Organ Transplant, cancer chemotherapy, Rheumatoid biologic meds such as Humera)  3. Poorly controlled diabetes (A1C &gt; 8.0, blood glucose over 200)  If you have one of these conditions, contact your surgeon for an antibiotic prescription, prior to your dental procedure.    Pick up stool softner and laxative for home use following surgery while on pain medications. Do not submerge incision under water. Please use good hand washing techniques while changing dressing each day. May shower starting three days after surgery. Please use a clean towel to pat the incision dry following showers. Continue to use ice for pain and swelling after surgery. Do not use any lotions or creams on the incision until instructed by your surgeon.  

## 2020-10-09 NOTE — Evaluation (Signed)
Physical Therapy Evaluation Patient Details Name: Dawn Perry MRN: [id number] DOB: August 05, 1950 Today's Date: 10/09/2020  History of Present Illness  pt s/p RDATHA on 10/09/2020  Clinical Impression  Pt tolerated session well today except for getting diaphoretic after walking to bathroom and with returning to recliner. Unable to take her BP, but pt became very sweaty, hot and light headed. It resolved after sitting elevating feet, and reclining back a little after about 3 minutes. Ambulated well short distance today , feel like pt will progress well with mobility.        Recommendations for follow up therapy are one component of a multi-disciplinary discharge planning process, led by the attending physician.  Recommendations may be updated based on patient status, additional functional criteria and insurance authorization.  Follow Up Recommendations Follow surgeon's recommendation for DC plan and follow-up therapies    Equipment Recommendations  None recommended by PT    Recommendations for Other Services       Precautions / Restrictions Precautions Precautions: None Restrictions Other Position/Activity Restrictions: WBAT RLE      Mobility  Bed Mobility Overal bed mobility: Needs Assistance Bed Mobility: Supine to Sit;Sit to Supine     Supine to sit: Min assist     General bed mobility comments: Asssit with lower extremity and  pt used bed rails    Transfers Overall transfer level: Needs assistance   Transfers: Sit to/from Stand Sit to Stand: Min assist         General transfer comment: cues for RW sue and safety  Ambulation/Gait Ambulation/Gait assistance: Min guard Gait Distance (Feet): 10 Feet (2 times , to bathroom and back to recliner) Assistive device: Rolling walker (2 wheeled) Gait Pattern/deviations: Step-to pattern     General Gait Details: tolerated walkign well, however became diaphoretic at end of session after bathroom visit so returned to  recliner quickly and elevated feeet and reclined head  Stairs            Wheelchair Mobility    Modified Rankin (Stroke Patients Only)       Balance Overall balance assessment: Needs assistance Sitting-balance support: No upper extremity supported Sitting balance-Leahy Scale: Good     Standing balance support: During functional activity;Bilateral upper extremity supported Standing balance-Leahy Scale: Fair                               Pertinent Vitals/Pain Pain Assessment: 0-10 Pain Score: 2  Pain Location: R hip Pain Descriptors / Indicators: Sore;Tightness Pain Intervention(s): Monitored during session;Ice applied    Home Living Family/patient expects to be discharged to:: Private residence Living Arrangements: Spouse/significant other Available Help at Discharge: Family Type of Home: House Home Access: Stairs to enter Entrance Stairs-Rails: None Entrance Stairs-Number of Steps: 2 Home Layout: Two level;Able to live on main level with bedroom/bathroom (walks to go to walk in shower on 2nd floor) Home Equipment: Walker - 2 wheels;Bedside commode      Prior Function Level of Independence: Independent               Hand Dominance        Extremity/Trunk Assessment        Lower Extremity Assessment Lower Extremity Assessment: Overall WFL for tasks assessed       Communication   Communication: No difficulties  Cognition Arousal/Alertness: Awake/alert Behavior During Therapy: WFL for tasks assessed/performed Overall Cognitive Status: Within Functional Limits for tasks assessed  General Comments      Exercises Total Joint Exercises Ankle Circles/Pumps: AROM;Both;10 reps;Supine Quad Sets: AROM;Both;10 reps;Supine Heel Slides: AAROM;Right;Supine;10 reps   Assessment/Plan    PT Assessment Patient needs continued PT services  PT Problem List Decreased strength;Decreased  activity tolerance       PT Treatment Interventions DME instruction;Gait training;Stair training;Functional mobility training;Therapeutic exercise;Therapeutic activities    PT Goals (Current goals can be found in the Care Plan section)  Acute Rehab PT Goals Patient Stated Goal: lk to feel better and be able to do more without my hip hurting PT Goal Formulation: With patient Time For Goal Achievement: 10/16/20 Potential to Achieve Goals: Good    Frequency 7X/week   Barriers to discharge        Co-evaluation               AM-PAC PT "6 Clicks" Mobility  Outcome Measure Help needed turning from your back to your side while in a flat bed without using bedrails?: A Little Help needed moving from lying on your back to sitting on the side of a flat bed without using bedrails?: A Little Help needed moving to and from a bed to a chair (including a wheelchair)?: A Little Help needed standing up from a chair using your arms (e.g., wheelchair or bedside chair)?: A Little Help needed to walk in hospital room?: A Little Help needed climbing 3-5 steps with a railing? : A Little 6 Click Score: 18    End of Session Equipment Utilized During Treatment: Gait belt Activity Tolerance: Patient tolerated treatment well Patient left: in chair;with call bell/phone within reach;with family/visitor present Nurse Communication: Mobility status PT Visit Diagnosis: Other abnormalities of gait and mobility (R26.89)    Time: 1720-1750 PT Time Calculation (min) (ACUTE ONLY): 30 min   Charges:   PT Evaluation $PT Eval Low Complexity: 1 Low PT Treatments $Gait Training: 8-22 mins        Bergen Magner, PT, MPT Acute Rehabilitation Services Office: [phone] Pager: [phone] 10/09/2020   Marella Bile 10/09/2020, 6:17 PM

## 2020-10-09 NOTE — Anesthesia Procedure Notes (Signed)
Procedure Name: Intubation Date/Time: 10/09/2020 11:27 AM Performed by: Gerald Leitz, CRNA Pre-anesthesia Checklist: Patient identified, Patient being monitored, Timeout performed, Emergency Drugs available and Suction available Patient Re-evaluated:Patient Re-evaluated prior to induction Oxygen Delivery Method: Circle system utilized Preoxygenation: Pre-oxygenation with 100% oxygen Induction Type: IV induction Ventilation: Mask ventilation without difficulty Laryngoscope Size: Mac and 3 Grade View: Grade I Tube type: Oral Tube size: 7.0 mm Number of attempts: 1 Placement Confirmation: ETT inserted through vocal cords under direct vision, positive ETCO2 and breath sounds checked- equal and bilateral Secured at: 21 cm Tube secured with: Tape Dental Injury: Teeth and Oropharynx as per pre-operative assessment

## 2020-10-09 NOTE — Op Note (Signed)
OPERATIVE REPORT- TOTAL HIP ARTHROPLASTY   PREOPERATIVE DIAGNOSIS: Osteoarthritis of the Right hip.   POSTOPERATIVE DIAGNOSIS: Osteoarthritis of the Right  hip.   PROCEDURE: Right total hip arthroplasty, anterior approach.   SURGEON: Ollen Gross, MD   ASSISTANT: Nelia Shi, PA-C  ANESTHESIA:  General  ESTIMATED BLOOD LOSS:-250 mL    DRAINS: None  COMPLICATIONS: None   CONDITION: PACU - hemodynamically stable.   BRIEF CLINICAL NOTE: Dawn Perry is a 70 y.o. female who has advanced end-  stage arthritis of their Right  hip with progressively worsening pain and  dysfunction.The patient has failed nonoperative management and presents for  total hip arthroplasty.   PROCEDURE IN DETAIL: After successful administration of spinal  anesthetic, the traction boots for the [facility] bed were placed on both  feet and the patient was placed onto the [employer] bed, boots placed into the leg  holders. The Right hip was then isolated from the perineum with plastic  drapes and prepped and draped in the usual sterile fashion. ASIS and  greater trochanter were marked and a oblique incision was made, starting  at about 1 cm lateral and 2 cm distal to the ASIS and coursing towards  the anterior cortex of the femur. The skin was cut with a 10 blade  through subcutaneous tissue to the level of the fascia overlying the  tensor fascia lata muscle. The fascia was then incised in line with the  incision at the junction of the anterior third and posterior 2/3rd. The  muscle was teased off the fascia and then the interval between the TFL  and the rectus was developed. The Hohmann retractor was then placed at  the top of the femoral neck over the capsule. The vessels overlying the  capsule were cauterized and the fat on top of the capsule was removed.  A Hohmann retractor was then placed anterior underneath the rectus  femoris to give exposure to the entire anterior capsule. A T-shaped   capsulotomy was performed. The edges were tagged and the femoral head  was identified.       Osteophytes are removed off the superior acetabulum.  The femoral neck was then cut in situ with an oscillating saw. Traction  was then applied to the left lower extremity utilizing the [facility]  traction. The femoral head was then removed. Retractors were placed  around the acetabulum and then circumferential removal of the labrum was  performed. Osteophytes were also removed. Reaming starts at 47 mm to  medialize and  Increased in 2 mm increments to 49 mm. We reamed in  approximately 40 degrees of abduction, 20 degrees anteversion. A 50 mm  pinnacle acetabular shell was then impacted in anatomic position under  fluoroscopic guidance with excellent purchase. We did not need to place  any additional dome screws. A 32 mm neutral + 4 marathon liner was then  placed into the acetabular shell.       The femoral lift was then placed along the lateral aspect of the femur  just distal to the vastus ridge. The leg was  externally rotated and capsule  was stripped off the inferior aspect of the femoral neck down to the  level of the lesser trochanter, this was done with electrocautery. The femur was lifted after this was performed. The  leg was then placed in an extended and adducted position essentially delivering the femur. We also removed the capsule superiorly and the piriformis from the piriformis fossa to  gain excellent exposure of the  proximal femur. Rongeur was used to remove some cancellous bone to get  into the lateral portion of the proximal femur for placement of the  initial starter reamer. The starter broaches was placed  the starter broach  and was shown to go down the center of the canal. Broaching  with the Actis system was then performed starting at size 0  coursing  Up to size 3. A size 3 had excellent torsional and rotational  and axial stability. The trial standard offset neck was then  placed  with a 32 + 1 trial head. The hip was then reduced. We confirmed that  the stem was in the canal both on AP and lateral x-rays. It also has excellent sizing. The hip was reduced with outstanding stability through full extension and full external rotation.. AP pelvis was taken and the leg lengths were measured and found to be equal. Hip was then dislocated again and the femoral head and neck removed. The  femoral broach was removed. Size 3 Actis stem with a standard offset  neck was then impacted into the femur following native anteversion. Has  excellent purchase in the canal. Excellent torsional and rotational and  axial stability. It is confirmed to be in the canal on AP and lateral  fluoroscopic views. The 32 + 1 ceramic head was placed and the hip  reduced with outstanding stability. Again AP pelvis was taken and it  confirmed that the leg lengths were equal. The wound was then copiously  irrigated with saline solution and the capsule reattached and repaired  with Ethibond suture. 30 ml of .25% Bupivicaine was  injected into the capsule and into the edge of the tensor fascia lata as well as subcutaneous tissue. The fascia overlying the tensor fascia lata was then closed with a running #1 V-Loc. Subcu was closed with interrupted 2-0 Vicryl and subcuticular running 4-0 Monocryl. Incision was cleaned  and dried. Steri-Strips and a bulky sterile dressing applied. The patient was awakened and transported to  recovery in stable condition.        Please note that a surgical assistant was a medical necessity for this procedure to perform it in a safe and expeditious manner. Assistant was necessary to provide appropriate retraction of vital neurovascular structures and to prevent femoral fracture and allow for anatomic placement of the prosthesis.  Ollen Gross, M.D.

## 2020-10-09 NOTE — Care Plan (Signed)
Ortho Bundle Case Management Note  Patient Details  Name: Dawn Perry MRN: [id number] Date of Birth: 1950-12-02                  R THA on 10/09/20. DCP: Home with husband. 2 story home with 2 steps. DME: No needs. Has RW & 3in1. PT: HEP   DME Arranged:  N/A DME Agency:     HH Arranged:    HH Agency:     Additional Comments: Please contact me with any questions of if this plan should need to change.  Ennis Forts, RN,CCM [facility]  [phone] 10/09/2020, 10:44 AM

## 2020-10-10 ENCOUNTER — Encounter (HOSPITAL_COMMUNITY): Payer: Self-pay | Admitting: Orthopedic Surgery

## 2020-10-10 DIAGNOSIS — I2510 Atherosclerotic heart disease of native coronary artery without angina pectoris: Secondary | ICD-10-CM | POA: Diagnosis not present

## 2020-10-10 DIAGNOSIS — Z7982 Long term (current) use of aspirin: Secondary | ICD-10-CM | POA: Diagnosis not present

## 2020-10-10 DIAGNOSIS — M1611 Unilateral primary osteoarthritis, right hip: Secondary | ICD-10-CM | POA: Diagnosis not present

## 2020-10-10 LAB — CBC
HCT: 32 % — ABNORMAL LOW (ref 36.0–46.0)
Hemoglobin: 11.1 g/dL — ABNORMAL LOW (ref 12.0–15.0)
MCH: 31.4 pg (ref 26.0–34.0)
MCHC: 34.7 g/dL (ref 30.0–36.0)
MCV: 90.4 fL (ref 80.0–100.0)
Platelets: 164 K/uL (ref 150–400)
RBC: 3.54 MIL/uL — ABNORMAL LOW (ref 3.87–5.11)
RDW: 11.8 % (ref 11.5–15.5)
WBC: 11.5 K/uL — ABNORMAL HIGH (ref 4.0–10.5)
nRBC: 0 % (ref 0.0–0.2)

## 2020-10-10 LAB — BASIC METABOLIC PANEL
Anion gap: 9 (ref 5–15)
BUN: 12 mg/dL (ref 8–23)
CO2: 21 mmol/L — ABNORMAL LOW (ref 22–32)
Calcium: 8.2 mg/dL — ABNORMAL LOW (ref 8.9–10.3)
Chloride: 106 mmol/L (ref 98–111)
Creatinine, Ser: 0.71 mg/dL (ref 0.44–1.00)
GFR, Estimated: >60 mL/min (ref >60)
Glucose, Bld: 132 mg/dL — ABNORMAL HIGH (ref 70–99)
Potassium: 4 mmol/L (ref 3.5–5.1)
Sodium: 136 mmol/L (ref 135–145)

## 2020-10-10 MED ORDER — METHOCARBAMOL 500 MG PO TABS: 500.0000 mg | ORAL_TABLET | Freq: Four times a day (QID) | ORAL | 0 refills | Status: DC | PRN

## 2020-10-10 MED ORDER — TRAMADOL HCL 50 MG PO TABS: 50.0000 mg | ORAL_TABLET | Freq: Four times a day (QID) | ORAL | 0 refills | Status: DC | PRN

## 2020-10-10 MED ORDER — HYDROCODONE-ACETAMINOPHEN 5-325 MG PO TABS: 1.0000 | ORAL_TABLET | Freq: Four times a day (QID) | ORAL | 0 refills | Status: DC | PRN

## 2020-10-10 MED ORDER — ASPIRIN 325 MG PO TBEC: 325.0000 mg | DELAYED_RELEASE_TABLET | Freq: Two times a day (BID) | ORAL | 0 refills | Status: DC

## 2020-10-10 NOTE — Progress Notes (Signed)
Physical Therapy Treatment Patient Details Name: Dawn Perry MRN: [id number] DOB: [birth year] Today's Date: 10/10/2020   History of Present Illness pt s/p RDATHA on 10/09/2020    PT Comments    Pt ambulated 150' with RW, no loss of balance. Pt demonstrates good understanding of HEP. Stair training completed. She is ready to DC from a PT standpoint.     Recommendations for follow up therapy are one component of a multi-disciplinary discharge planning process, led by the attending physician.  Recommendations may be updated based on patient status, additional functional criteria and insurance authorization.  Follow Up Recommendations  Follow surgeon's recommendation for DC plan and follow-up therapies     Equipment Recommendations  None recommended by PT    Recommendations for Other Services       Precautions / Restrictions Precautions Precautions: Fall Restrictions Weight Bearing Restrictions: No RLE Weight Bearing: Weight bearing as tolerated     Mobility  Bed Mobility Overal bed mobility: Needs Assistance Bed Mobility: Supine to Sit     Supine to sit: Modified independent (Device/Increase time)     General bed mobility comments: pt used bed rails    Transfers Overall transfer level: Modified independent Equipment used: Rolling walker (2 wheeled) Transfers: Sit to/from Stand Sit to Stand: Modified independent (Device/Increase time)         General transfer comment: and safety  Ambulation/Gait Ambulation/Gait assistance: Supervision Gait Distance (Feet): 150 Feet Assistive device: Rolling walker (2 wheeled) Gait Pattern/deviations: Step-to pattern     General Gait Details: good sequencing, no loss of balance   Stairs Stairs: Yes Stairs assistance: Min assist Stair Management: No rails;Backwards;With walker;Step to pattern Number of Stairs: 2 General stair comments: husband present and assisted with management of RW, VCs  sequencing   Wheelchair Mobility    Modified Rankin (Stroke Patients Only)       Balance Overall balance assessment: Needs assistance Sitting-balance support: No upper extremity supported Sitting balance-Leahy Scale: Good     Standing balance support: During functional activity;Bilateral upper extremity supported Standing balance-Leahy Scale: Fair                              Cognition Arousal/Alertness: Awake/alert Behavior During Therapy: WFL for tasks assessed/performed Overall Cognitive Status: Within Functional Limits for tasks assessed                                        Exercises Total Joint Exercises Ankle Circles/Pumps: AROM;Both;10 reps;Supine Quad Sets: AROM;Both;10 reps;Supine Short Arc Quad: AROM;Right;10 reps;Supine Heel Slides: AAROM;Right;Supine;10 reps Hip ABduction/ADduction: AAROM;Right;10 reps;Supine Long Arc Quad: AROM;Right;10 reps;Seated    General Comments        Pertinent Vitals/Pain Pain Score: 4  Pain Location: R hip Pain Descriptors / Indicators: Sore;Tightness Pain Intervention(s): Limited activity within patient's tolerance;Monitored during session;Premedicated before session;Ice applied    Home Living                      Prior Function            PT Goals (current goals can now be found in the care plan section) Acute Rehab PT Goals Patient Stated Goal: lk to feel better and be able to do more without my hip hurting PT Goal Formulation: With patient Time For Goal Achievement: 10/16/20 Potential to Achieve Goals: Good Progress towards  PT goals: Goals met/education completed, patient discharged from PT    Frequency    7X/week      PT Plan Current plan remains appropriate    Co-evaluation              AM-PAC PT "6 Clicks" Mobility   Outcome Measure  Help needed turning from your back to your side while in a flat bed without using bedrails?: A Little Help needed moving  from lying on your back to sitting on the side of a flat bed without using bedrails?: A Little Help needed moving to and from a bed to a chair (including a wheelchair)?: None Help needed standing up from a chair using your arms (e.g., wheelchair or bedside chair)?: None Help needed to walk in hospital room?: None Help needed climbing 3-5 steps with a railing? : A Little 6 Click Score: 21    End of Session Equipment Utilized During Treatment: Gait belt Activity Tolerance: Patient tolerated treatment well Patient left: in chair;with call bell/phone within reach;with family/visitor present Nurse Communication: Mobility status PT Visit Diagnosis: Other abnormalities of gait and mobility (R26.89)     Time: 0289-0228 PT Time Calculation (min) (ACUTE ONLY): 30 min  Charges:  $Gait Training: 8-22 mins $Therapeutic Exercise: 8-22 mins                    Blondell Reveal Kistler PT 10/10/2020  Acute Rehabilitation Services Pager [phone] Office [phone]

## 2020-10-10 NOTE — Progress Notes (Signed)
   Subjective: 1 Day Post-Op Procedure(s) (LRB): TOTAL HIP ARTHROPLASTY ANTERIOR APPROACH (Right) Patient reports pain as mild.   Patient seen in rounds by Dr. Lequita Halt. Patient is well, and has had no acute complaints or problems. Denies SOB, chest pain, or calf pain. No acute overnight events. Ambulated 10 feet with therapy yesterday. Will continue therapy today.    Objective: Vital signs in last 24 hours: Temp:  [96.2 F (35.7 C)-98.3 F (36.8 C)] 97.6 F (36.4 C) (09/22 0557) Pulse Rate:  [69-85] 79 (09/22 0557) Resp:  [15-22] 16 (09/22 0557) BP: (96-151)/(43-78) 132/51 (09/22 0557) SpO2:  [97 %-100 %] 100 % (09/22 0557) Weight:  [63 kg] 63 kg (09/21 0922)  Intake/Output from previous day:  Intake/Output Summary (Last 24 hours) at 10/10/2020 0732 Last data filed at 10/10/2020 0320 Gross per 24 hour  Intake 2543.49 ml  Output 250 ml  Net 2293.49 ml     Intake/Output this shift: No intake/output data recorded.  Labs: Recent Labs    10/10/20 0329  HGB 11.1*   Recent Labs    10/10/20 0329  WBC 11.5*  RBC 3.54*  HCT 32.0*  PLT 164   Recent Labs    10/10/20 0329  NA 136  K 4.0  CL 106  CO2 21*  BUN 12  CREATININE 0.71  GLUCOSE 132*  CALCIUM 8.2*   No results for input(s): LABPT, INR in the last 72 hours.  Exam: General - Patient is Alert and Oriented Extremity - Neurologically intact Neurovascular intact Intact pulses distally Dorsiflexion/Plantar flexion intact Dressing - dressing C/D/I Motor Function - intact, moving foot and toes well on exam.   Past Medical History:  Diagnosis Date   CAD in native artery 09/26/2020   Elevated blood pressure reading 09/26/2020   Family history of adverse reaction to anesthesia    mother had problems with N/V   GERD (gastroesophageal reflux disease)    B hands   H/O scarlet fever    age 28   Heart murmur    History of chicken pox    Hyperlipidemia    LBBB (left bundle branch block)    Osteoarthritis     PONV (postoperative nausea and vomiting)     Assessment/Plan: 1 Day Post-Op Procedure(s) (LRB): TOTAL HIP ARTHROPLASTY ANTERIOR APPROACH (Right) Principal Problem:   OA (osteoarthritis) of hip Active Problems:   Primary osteoarthritis of right hip  Estimated body mass index is 24.62 kg/m as calculated from the following:   Height as of this encounter: 5\' 3"  (1.6 m).   Weight as of this encounter: 63 kg. Up with therapy  DVT Prophylaxis - Aspirin and TED hose Weight bearing as tolerated. Continue therapy.  Plan is to go Home after hospital stay.   Plan for two sessions with PT this morning, and if meeting goals, will plan for discharge this afternoon.   Patient to follow up in two weeks with Dr. in clinic.   The PDMP database was reviewed today prior to any opioid medications being prescribed to this patient.Lequita Halt, MBA, PA-C Orthopedic Surgery [phone] 10/10/2020, 7:32 AM

## 2020-10-10 NOTE — Plan of Care (Signed)
Discharge instructions given to the patient and her husband  

## 2020-10-10 NOTE — Plan of Care (Signed)

## 2020-10-10 NOTE — TOC Transition Note (Signed)
Transition of Care (TOC) - CM/SW Discharge Note  Patient Details  Name: Dawn Perry MRN: [id number] Date of Birth: 07/15/1950  Transition of Care (TOC) CM/SW Contact:  Megan S Glenn, LCSW Phone Number: 10/10/2020, 9:45 AM  Clinical Narrative: Patient is expected to discharge home after working with PT. CSW met with patient and her husband to review discharge plan. Patient will discharge home with a home exercise program (HEP). Patient has rolling walker and 3N1 at home, so there are no DME needs at this time. TOC signing off.  Final next level of care: Home/Self Care Barriers to Discharge: No Barriers Identified  Patient Goals and CMS Choice Patient states their goals for this hospitalization and ongoing recovery are:: Discharge home CMS Medicare.gov Compare Post Acute Care list provided to:: Patient Choice offered to / list presented to : NA  Discharge Plan and Services           DME Arranged: N/A DME Agency: NA  Readmission Risk Interventions No flowsheet data found.     

## 2020-10-11 ENCOUNTER — Encounter: Payer: Self-pay | Admitting: Physician Assistant

## 2020-10-12 DIAGNOSIS — I959 Hypotension, unspecified: Secondary | ICD-10-CM | POA: Diagnosis not present

## 2020-10-12 DIAGNOSIS — R55 Syncope and collapse: Secondary | ICD-10-CM | POA: Diagnosis not present

## 2020-10-16 NOTE — Discharge Summary (Signed)
Physician Discharge Summary   Patient ID: Dawn Perry MRN: [id number] DOB/AGE: 70-Apr-1952 70 y.o.  Admit date: 10/09/2020 Discharge date: 10/10/2020  Primary Diagnosis: S/P Right THA   Admission Diagnoses:  Past Medical History:  Diagnosis Date   CAD in native artery 09/26/2020   Elevated blood pressure reading 09/26/2020   Family history of adverse reaction to anesthesia    mother had problems with N/V   GERD (gastroesophageal reflux disease)    B hands   H/O scarlet fever    age 63   Heart murmur    History of chicken pox    Hyperlipidemia    LBBB (left bundle branch block)    Osteoarthritis    PONV (postoperative nausea and vomiting)    Discharge Diagnoses:   Principal Problem:   OA (osteoarthritis) of hip Active Problems:   Primary osteoarthritis of right hip  Estimated body mass index is 24.62 kg/m as calculated from the following:   Height as of this encounter: 5' 3" (1.6 m).   Weight as of this encounter: 63 kg.  Procedure:  Procedure(s) (LRB): TOTAL HIP ARTHROPLASTY ANTERIOR APPROACH (Right)   Consults: None  HPI: Dawn Perry is a 70 y.o. female who has advanced end-  stage arthritis of their Right  hip with progressively worsening pain and  dysfunction.The patient has failed nonoperative management and presents for  total hip arthroplasty.   Laboratory Data: Admission on 10/09/2020, Discharged on 10/10/2020  Component Date Value Ref Range Status   ABO/RH(D) 10/09/2020    Final                   Value:A POS Performed at [facility], [address] [place], [place] [address]    WBC 10/10/2020 11.5 (A) 4.0 - 10.5 K/uL Final   RBC 10/10/2020 3.54 (A) 3.87 - 5.11 MIL/uL Final   Hemoglobin 10/10/2020 11.1 (A) 12.0 - 15.0 g/dL Final   HCT 10/10/2020 32.0 (A) 36.0 - 46.0 % Final   MCV 10/10/2020 90.4  80.0 - 100.0 fL Final   MCH 10/10/2020 31.4  26.0 - 34.0 pg Final   MCHC 10/10/2020 34.7  30.0 - 36.0 g/dL Final   RDW  10/10/2020 11.8  11.5 - 15.5 % Final   Platelets 10/10/2020 164  150 - 400 K/uL Final   nRBC 10/10/2020 0.0  0.0 - 0.2 % Final   Performed at [facility], [place] [address], [place], [place] [address]   Sodium 10/10/2020 136  135 - 145 mmol/L Final   Potassium 10/10/2020 4.0  3.5 - 5.1 mmol/L Final   Chloride 10/10/2020 106  98 - 111 mmol/L Final   CO2 10/10/2020 21 (A) 22 - 32 mmol/L Final   Glucose, Bld 10/10/2020 132 (A) 70 - 99 mg/dL Final   Glucose reference range applies only to samples taken after fasting for at least 8 hours.   BUN 10/10/2020 12  8 - 23 mg/dL Final   Creatinine, Ser 10/10/2020 0.71  0.44 - 1.00 mg/dL Final   Calcium 10/10/2020 8.2 (A) 8.9 - 10.3 mg/dL Final   GFR, Estimated 10/10/2020 >60  >60 mL/min Final   Comment: (NOTE) Calculated using the CKD-EPI Creatinine Equation (2021)    Anion gap 10/10/2020 9  5 - 15 Final   Performed at [facility], [place] [address], [place], [place] [address]  Orders Only on 10/07/2020  Component Date Value Ref Range Status   SARS Coronavirus 2 10/07/2020 RESULT: NEGATIVE  Final   Comment: RESULT: NEGATIVESARS-CoV-2 INTERPRETATION:A NEGATIVE  test result means that SARS-CoV-2 RNA was not present in the specimen above the limit of detection of this test. This does not preclude a possible SARS-CoV-2 infection and should not be used as the  sole basis for patient management decisions. Negative results must be combined with clinical observations, patient history, and epidemiological information. Optimum specimen types and timing for peak viral levels during infections caused by SARS-CoV-2  have not been determined. Collection of multiple specimens or types of specimens may be necessary to detect virus. Improper specimen collection and handling, sequence variability under primers/probes, or organism present below the limit of detection may  lead to false negative results. Positive and negative predictive  values of testing are highly dependent on prevalence. False negative test results are more likely when prevalence of disease is high.The expected result is NEGATIVE.Fact S                          heet for  Healthcare Providers: LocalChronicle.no Sheet for Patients: SalonLookup.es Reference Range - Negative   Hospital Outpatient Visit on 10/01/2020  Component Date Value Ref Range Status   MRSA, PCR 10/01/2020 NEGATIVE  NEGATIVE Final   Staphylococcus aureus 10/01/2020 POSITIVE (A) NEGATIVE Final   Comment: (NOTE) The Xpert SA Assay (FDA approved for NASAL specimens in patients 24 years of age and older), is one component of a comprehensive surveillance program. It is not intended to diagnose infection nor to guide or monitor treatment. Performed at [facility], [place] [address], [place], [place] [address]    WBC 10/01/2020 6.4  4.0 - 10.5 K/uL Final   RBC 10/01/2020 4.60  3.87 - 5.11 MIL/uL Final   Hemoglobin 10/01/2020 14.3  12.0 - 15.0 g/dL Final   HCT 10/01/2020 42.3  36.0 - 46.0 % Final   MCV 10/01/2020 92.0  80.0 - 100.0 fL Final   MCH 10/01/2020 31.1  26.0 - 34.0 pg Final   MCHC 10/01/2020 33.8  30.0 - 36.0 g/dL Final   RDW 10/01/2020 11.9  11.5 - 15.5 % Final   Platelets 10/01/2020 209  150 - 400 K/uL Final   nRBC 10/01/2020 0.0  0.0 - 0.2 % Final   Performed at [facility], [place] [address], [place], [place] [address]   Sodium 10/01/2020 137  135 - 145 mmol/L Final   Potassium 10/01/2020 4.1  3.5 - 5.1 mmol/L Final   Chloride 10/01/2020 104  98 - 111 mmol/L Final   CO2 10/01/2020 24  22 - 32 mmol/L Final   Glucose, Bld 10/01/2020 99  70 - 99 mg/dL Final   Glucose reference range applies only to samples taken after fasting for at least 8 hours.   BUN 10/01/2020 16  8 - 23 mg/dL Final   Creatinine, Ser 10/01/2020 0.64  0.44 - 1.00 mg/dL Final   Calcium 10/01/2020 9.3  8.9 - 10.3  mg/dL Final   Total Protein 10/01/2020 7.5  6.5 - 8.1 g/dL Final   Albumin 10/01/2020 4.7  3.5 - 5.0 g/dL Final   AST 10/01/2020 21  15 - 41 U/L Final   ALT 10/01/2020 21  0 - 44 U/L Final   Alkaline Phosphatase 10/01/2020 63  38 - 126 U/L Final   Total Bilirubin 10/01/2020 0.8  0.3 - 1.2 mg/dL Final   GFR, Estimated 10/01/2020 >60  >60 mL/min Final   Comment: (NOTE) Calculated using the CKD-EPI Creatinine  Equation (2021)    Anion gap 10/01/2020 9  5 - 15 Final   Performed at [facility], [place] [address] [place], [place] [address]   ABO/RH(D) 10/01/2020 A POS   Final   Antibody Screen 10/01/2020 NEG   Final   Sample Expiration 10/01/2020 10/12/2020,2359   Final   Extend sample reason 10/01/2020    Final                   Value:NO TRANSFUSIONS OR PREGNANCY IN THE PAST 3 MONTHS Performed at [place] [address], [place], [place] [address]   Orders Only on 09/20/2020  Component Date Value Ref Range Status   Glucose 09/20/2020 89  65 - 99 mg/dL Final   BUN 09/20/2020 19  8 - 27 mg/dL Final   Creatinine, Ser 09/20/2020 0.83  0.57 - 1.00 mg/dL Final   eGFR 09/20/2020 76  >59 mL/min/1.73 Final   BUN/Creatinine Ratio 09/20/2020 23  12 - 28 Final   Sodium 09/20/2020 142  134 - 144 mmol/L Final   Potassium 09/20/2020 4.6  3.5 - 5.2 mmol/L Final   Chloride 09/20/2020 104  96 - 106 mmol/L Final   CO2 09/20/2020 24  20 - 29 mmol/L Final   Calcium 09/20/2020 9.6  8.7 - 10.3 mg/dL Final   Cholesterol, Total 09/20/2020 172  100 - 199 mg/dL Final   Triglycerides 09/20/2020 154 (A) 0 - 149 mg/dL Final   HDL 09/20/2020 50  >39 mg/dL Final   VLDL Cholesterol Cal 09/20/2020 27  5 - 40 mg/dL Final   LDL Chol Calc (NIH) 09/20/2020 95  0 - 99 mg/dL Final   Chol/HDL Ratio 09/20/2020 3.4  0.0 - 4.4 ratio Final   Comment:                                   T. Chol/HDL Ratio                                             Men  Women                                1/2 Avg.Risk  3.4    3.3                                   Avg.Risk  5.0    4.4                                2X Avg.Risk  9.6    7.1                                3X Avg.Risk 23.4   11.0    Total Protein 09/20/2020 7.1  6.0 - 8.5 g/dL Final   Albumin 09/20/2020 4.9 (A) 3.8 - 4.8 g/dL Final   Bilirubin Total 09/20/2020 0.5  0.0 - 1.2 mg/dL Final   Bilirubin, Direct 09/20/2020 0.13  0.00 - 0.40 mg/dL Final   Alkaline Phosphatase 09/20/2020 83  44 - 121  IU/L Final   AST 09/20/2020 21  0 - 40 IU/L Final   ALT 09/20/2020 24  0 - 32 IU/L Final     X-Rays:DG Pelvis Portable  Result Date: 10/09/2020 CLINICAL DATA:  Status post right total hip splint. EXAM: PORTABLE PELVIS 1-2 VIEWS COMPARISON:  None. FINDINGS: Right hip arthroplasty in expected alignment. No periprosthetic lucency or fracture. Recent postsurgical change includes air and edema in the soft tissues. IMPRESSION: Right hip arthroplasty without immediate postoperative complication. Electronically Signed   By: Keith Rake M.D.   On: 10/09/2020 14:37   DG C-Arm 1-60 Min-No Report  Result Date: 10/09/2020 Fluoroscopy was utilized by the requesting physician.  No radiographic interpretation.   DG HIP OPERATIVE UNILAT W OR W/O PELVIS RIGHT  Result Date: 10/09/2020 CLINICAL DATA:  Right hip replacement. EXAM: OPERATIVE RIGHT HIP (WITH PELVIS IF PERFORMED) TECHNIQUE: Fluoroscopic spot image(s) were submitted for interpretation post-operatively. COMPARISON:  None. FINDINGS: Two fluoroscopic spot views of the pelvis and right hip obtained in the operating room. Right hip arthroplasty. Fluoroscopy time 8 seconds. IMPRESSION: Procedural fluoroscopy for right hip arthroplasty. Electronically Signed   By: Keith Rake M.D.   On: 10/09/2020 13:17    EKG: Orders placed or performed during the hospital encounter of 10/01/20   EKG 12 lead per protocol   EKG 12 lead per protocol     Hospital Course: Dawn Perry is a 70 y.o. who was  admitted to [facility]. They were brought to the operating room on 10/09/2020 and underwent Procedure(s): [place].  Patient tolerated the procedure well and was later transferred to the recovery room and then to the orthopaedic floor for postoperative care. They were given PO and IV analgesics for pain control following their surgery. They were given 24 hours of postoperative antibiotics of  Anti-infectives (From admission, onward)    Start     Dose/Rate Route Frequency Ordered Stop   10/09/20 1800  ceFAZolin (ANCEF) IVPB 2g/100 mL premix        2 g 200 mL/hr over 30 Minutes Intravenous Every 6 hours 10/09/20 1537 10/10/20 0009   10/09/20 0915  ceFAZolin (ANCEF) IVPB 2g/100 mL premix  Status:  Discontinued        2 g 200 mL/hr over 30 Minutes Intravenous On call to O.R. 10/09/20 0962 10/09/20 1521      and started on DVT prophylaxis in the form of Aspirin and TED hose.   PT and OT were ordered for total joint protocol. Discharge planning consulted to help with postop disposition and equipment needs.  Patient had an uneventful night on the evening of surgery. They started to get up OOB with therapy on 10/10/2020. Pt was seen during rounds and was ready to go home pending progress with therapy. She worked with therapy on POD #1 and was meeting goals. Pt was discharged to home later that day in stable condition.  Diet: Regular diet Activity: WBAT Follow-up: in two weeks Disposition: Home Discharged Condition: good   Discharge Instructions     Call MD / Call 911   Complete by: As directed    If you experience chest pain or shortness of breath, CALL 911 and be transported to the hospital emergency room.  If you develope a fever above 101 F, pus (white drainage) or increased drainage or redness at the wound, or calf pain, call your surgeon's office.   Change dressing   Complete by: As directed    You have  an adhesive waterproof bandage over the  incision. Leave this in place until your first follow-up appointment. Once you remove this you will not need to place another bandage.   Constipation Prevention   Complete by: As directed    Drink plenty of fluids.  Prune juice may be helpful.  You may use a stool softener, such as Colace (over the counter) 100 mg twice a day.  Use MiraLax (over the counter) for constipation as needed.   Diet - low sodium heart healthy   Complete by: As directed    Do not sit on low chairs, stoools or toilet seats, as it may be difficult to get up from low surfaces   Complete by: As directed    Driving restrictions   Complete by: As directed    No driving for two weeks   Post-operative opioid taper instructions:   Complete by: As directed    POST-OPERATIVE OPIOID TAPER INSTRUCTIONS: It is important to wean off of your opioid medication as soon as possible. If you do not need pain medication after your surgery it is ok to stop day one. Opioids include: Codeine, Hydrocodone(Norco, Vicodin), Oxycodone(Percocet, oxycontin) and hydromorphone amongst others.  Long term and even short term use of opiods can cause: Increased pain response Dependence Constipation Depression Respiratory depression And more.  Withdrawal symptoms can include Flu like symptoms Nausea, vomiting And more Techniques to manage these symptoms Hydrate well Eat regular healthy meals Stay active Use relaxation techniques(deep breathing, meditating, yoga) Do Not substitute Alcohol to help with tapering If you have been on opioids for less than two weeks and do not have pain than it is ok to stop all together.  Plan to wean off of opioids This plan should start within one week post op of your joint replacement. Maintain the same interval or time between taking each dose and first decrease the dose.  Cut the total daily intake of opioids by one tablet each day Next start to increase the time between doses. The last dose that should  be eliminated is the evening dose.      TED hose   Complete by: As directed    Use stockings (TED hose) for three weeks on both leg(s).  You may remove them at night for sleeping.   Weight bearing as tolerated   Complete by: As directed       Allergies as of 10/10/2020   No Known Allergies      Medication List     STOP taking these medications    acetaminophen 500 MG tablet Commonly known as: TYLENOL       TAKE these medications    aspirin 325 MG EC tablet Take 1 tablet (325 mg total) by mouth 2 (two) times daily. Take for 21 days. Then take one 81 mg aspirin once a day for three weeks. Then discontinue aspirin. What changed:  medication strength how much to take when to take this additional instructions   HYDROcodone-acetaminophen 5-325 MG tablet Commonly known as: NORCO/VICODIN Take 1-2 tablets by mouth every 6 (six) hours as needed for severe pain.   methocarbamol 500 MG tablet Commonly known as: ROBAXIN Take 1 tablet (500 mg total) by mouth every 6 (six) hours as needed for muscle spasms.   Multi-Vitamin Daily Tabs Take 1 tablet by mouth daily.   rosuvastatin 20 MG tablet Commonly known as: CRESTOR Take 1 tablet (20 mg total) by mouth daily.   traMADol 50 MG tablet Commonly known as: ULTRAM Take  1-2 tablets (50-100 mg total) by mouth every 6 (six) hours as needed for moderate pain.               Discharge Care Instructions  (From admission, onward)           Start     Ordered   10/10/20 0000  Weight bearing as tolerated        10/10/20 0736   10/10/20 0000  Change dressing       Comments: You have an adhesive waterproof bandage over the incision. Leave this in place until your first follow-up appointment. Once you remove this you will not need to place another bandage.   10/10/20 0736            Follow-up Information     Gaynelle Arabian, MD. Schedule an appointment as soon as possible for a visit on 10/22/2020.   Specialty:  Orthopedic Surgery Why: You are scheduled for first post op appointment on Tuesday October 4th at 2:45pm. Contact information: [address] [place] [address] [phone]                 Signed: Fenton Foy, MBA, PA-C Orthopedic Surgery 10/16/2020, 8:30 AM

## 2020-10-28 ENCOUNTER — Other Ambulatory Visit: Payer: Self-pay

## 2020-10-28 DIAGNOSIS — E7800 Pure hypercholesterolemia, unspecified: Secondary | ICD-10-CM

## 2020-10-28 MED ORDER — ROSUVASTATIN CALCIUM 20 MG PO TABS: 20.0000 mg | ORAL_TABLET | Freq: Every day | ORAL | 3 refills | Status: DC

## 2020-11-12 DIAGNOSIS — Z471 Aftercare following joint replacement surgery: Secondary | ICD-10-CM | POA: Diagnosis not present

## 2020-11-12 DIAGNOSIS — Z96641 Presence of right artificial hip joint: Secondary | ICD-10-CM | POA: Diagnosis not present

## 2020-12-06 DIAGNOSIS — M19011 Primary osteoarthritis, right shoulder: Secondary | ICD-10-CM | POA: Diagnosis not present

## 2020-12-17 DIAGNOSIS — H35363 Drusen (degenerative) of macula, bilateral: Secondary | ICD-10-CM | POA: Diagnosis not present

## 2020-12-17 DIAGNOSIS — H2513 Age-related nuclear cataract, bilateral: Secondary | ICD-10-CM | POA: Diagnosis not present

## 2020-12-17 DIAGNOSIS — H11153 Pinguecula, bilateral: Secondary | ICD-10-CM | POA: Diagnosis not present

## 2020-12-17 DIAGNOSIS — H40013 Open angle with borderline findings, low risk, bilateral: Secondary | ICD-10-CM | POA: Diagnosis not present

## 2020-12-28 DIAGNOSIS — Z23 Encounter for immunization: Secondary | ICD-10-CM | POA: Diagnosis not present

## 2021-01-23 NOTE — Patient Instructions (Addendum)
DUE TO COVID-19 ONLY ONE VISITOR IS ALLOWED TO COME WITH YOU AND STAY IN THE WAITING ROOM ONLY DURING PRE OP AND PROCEDURE.   **NO VISITORS ARE ALLOWED IN THE SHORT STAY AREA OR RECOVERY ROOM!!**   Your procedure is scheduled on: 02/06/21   Report to [facility] Main Entrance    Report to admitting at 5:15 AM   Call this number if you have problems the morning of surgery [phone]   Do not eat food :After Midnight.   May have liquids until 4:30 AM day of surgery  CLEAR LIQUID DIET  Foods Allowed                                                                     Foods Excluded  Water, Black Coffee and tea (no milk or creamer)          liquids that you cannot  Plain Jell-O in any flavor  (No red)                                    see through such as: Fruit ices (not with fruit pulp)                                           milk, soups, orange juice              Iced Popsicles (No red)                                              All solid food                                   Apple juices Sports drinks like Gatorade (No red) Lightly seasoned clear broth or consume(fat free) Sugar     The day of surgery:  Drink ONE (1) Pre-Surgery Clear Ensure by 4:30 am the morning of surgery. Drink in one sitting. Do not sip.  This drink was given to you during your hospital  pre-op appointment visit. Nothing else to drink after completing the  Pre-Surgery Clear Ensure.          If you have questions, please contact your surgeons office.     Oral Hygiene is also important to reduce your risk of infection.                                    Remember - BRUSH YOUR TEETH THE MORNING OF SURGERY WITH YOUR REGULAR TOOTHPASTE   Stop all vitamins and supplements 7 days before your surgery  Take these medicines the morning of surgery with A SIP OF WATER: Tylenol, Crestor  You may not have any metal on your body including hair pins, jewelry, and body  piercing             Do not wear make-up, lotions, powders, perfumes, or deodorant  Do not wear nail polish including gel and S&S, artificial/acrylic nails, or any other type of covering on natural nails including finger and toenails. If you have artificial nails, gel coating, etc. that needs to be removed by a nail salon please have this removed prior to surgery or surgery may need to be canceled/ delayed if the surgeon/ anesthesia feels like they are unable to be safely monitored.   Do not shave  48 hours prior to surgery.    Do not bring valuables to the hospital. [place] IS NOT             RESPONSIBLE   FOR VALUABLES.   Patients discharged on the day of surgery will not be allowed to drive home.  We recommend you have a responsible adult to stay with you for the first 24 hours after anesthesia.   Special Instructions: Bring a copy of your healthcare power of attorney and living will documents         the day of surgery if you haven't scanned them before.              Please read over the following fact sheets you were given: IF YOU HAVE QUESTIONS ABOUT YOUR PRE-OP INSTRUCTIONS PLEASE CALL [phone]- [facility] - Preparing for Surgery Before surgery, you can play an important role.  Because skin is not sterile, your skin needs to be as free of germs as possible.  You can reduce the number of germs on your skin by washing with CHG (chlorahexidine gluconate) soap before surgery.  CHG is an antiseptic cleaner which kills germs and bonds with the skin to continue killing germs even after washing. Please DO NOT use if you have an allergy to CHG or antibacterial soaps.  If your skin becomes reddened/irritated stop using the CHG and inform your nurse when you arrive at Short Stay. Do not shave (including legs and underarms) for at least 48 hours prior to the first CHG shower.  You may shave your face/neck.  Please follow these instructions carefully:  1.  Shower with CHG Soap  the night before surgery and the  morning of surgery.  2.  If you choose to wash your hair, wash your hair first as usual with your normal  shampoo.  3.  After you shampoo, rinse your hair and body thoroughly to remove the shampoo.                             4.  Use CHG as you would any other liquid soap.  You can apply chg directly to the skin and wash.  Gently with a scrungie or clean washcloth.  5.  Apply the CHG Soap to your body ONLY FROM THE NECK DOWN.   Do   not use on face/ open                           Wound or open sores. Avoid contact with eyes, ears mouth and   genitals (private parts).                       Wash face,  Genitals (private parts) with your normal soap.             6.  Wash thoroughly, paying special attention to the area where your    surgery  will be performed.  7.  Thoroughly rinse your body with warm water from the neck down.  8.  DO NOT shower/wash with your normal soap after using and rinsing off the CHG Soap.                9.  Pat yourself dry with a clean towel.            10.  Wear clean pajamas.            11.  Place clean sheets on your bed the night of your first shower and do not  sleep with pets. Day of Surgery : Do not apply any lotions/deodorants the morning of surgery.  Please wear clean clothes to the hospital/surgery center.  FAILURE TO FOLLOW THESE INSTRUCTIONS MAY RESULT IN THE CANCELLATION OF YOUR SURGERY  PATIENT SIGNATURE_________________________________  NURSE SIGNATURE__________________________________  ________________________________________________________________________  [facility]    Before surgery, you can play an important role. Because skin is not sterile, your skin needs to be as free of germs as possible. You can reduce the number of germs on your skin by using the following products. Benzoyl Peroxide Gel Reduces the number of germs present on the skin Applied twice a day to  shoulder area starting two days before surgery    ==================================================================  Please follow these instructions carefully:  BENZOYL PEROXIDE 5% GEL  Please do not use if you have an allergy to benzoyl peroxide.   If your skin becomes reddened/irritated stop using the benzoyl peroxide.  Starting two days before surgery, apply as follows: Apply benzoyl peroxide in the morning and at night. Apply after taking a shower. If you are not taking a shower clean entire shoulder front, back, and side along with the armpit with a clean wet washcloth.  Place a quarter-sized dollop on your shoulder and rub in thoroughly, making sure to cover the front, back, and side of your shoulder, along with the armpit.   2 days before ____ AM   ____ PM              1 day before ____ AM   ____ PM                         Do this twice a day for two days.  (Last application is the night before surgery, AFTER using the CHG soap as described below).  Do NOT apply benzoyl peroxide gel on the day of surgery.   Incentive Spirometer  An incentive spirometer is a tool that can help keep your lungs clear and active. This tool measures how well you are filling your lungs with each breath. Taking long deep breaths may help reverse or decrease the chance of developing breathing (pulmonary) problems (especially infection) following: A long period of time when you are unable to move or be active. BEFORE THE PROCEDURE  If the spirometer includes an indicator to show your best effort, your nurse or respiratory therapist will set it to a desired goal. If possible, sit up straight or lean slightly forward. Try not to slouch. Hold the incentive spirometer in an upright position. INSTRUCTIONS FOR USE  Sit on the edge of your bed if possible, or sit up as  far as you can in bed or on a chair. Hold the incentive spirometer in an upright position. Breathe out normally. Place the mouthpiece in  your mouth and seal your lips tightly around it. Breathe in slowly and as deeply as possible, raising the piston or the ball toward the top of the column. Hold your breath for 3-5 seconds or for as long as possible. Allow the piston or ball to fall to the bottom of the column. Remove the mouthpiece from your mouth and breathe out normally. Rest for a few seconds and repeat Steps 1 through 7 at least 10 times every 1-2 hours when you are awake. Take your time and take a few normal breaths between deep breaths. The spirometer may include an indicator to show your best effort. Use the indicator as a goal to work toward during each repetition. After each set of 10 deep breaths, practice coughing to be sure your lungs are clear. If you have an incision (the cut made at the time of surgery), support your incision when coughing by placing a pillow or rolled up towels firmly against it. Once you are able to get out of bed, walk around indoors and cough well. You may stop using the incentive spirometer when instructed by your caregiver.  RISKS AND COMPLICATIONS Take your time so you do not get dizzy or light-headed. If you are in pain, you may need to take or ask for pain medication before doing incentive spirometry. It is harder to take a deep breath if you are having pain. AFTER USE Rest and breathe slowly and easily. It can be helpful to keep track of a log of your progress. Your caregiver can provide you with a simple table to help with this. If you are using the spirometer at home, follow these instructions: SEEK MEDICAL CARE IF:  You are having difficultly using the spirometer. You have trouble using the spirometer as often as instructed. Your pain medication is not giving enough relief while using the spirometer. You develop fever of 100.5 F (38.1 C) or higher. SEEK IMMEDIATE MEDICAL CARE IF:  You cough up bloody sputum that had not been present before. You develop fever of 102 F (38.9 C) or  greater. You develop worsening pain at or near the incision site. MAKE SURE YOU:  Understand these instructions. Will watch your condition. Will get help right away if you are not doing well or get worse. Document Released: 05/18/2006 Document Revised: 03/30/2011 Document Reviewed: 07/19/2006 ExitCare Patient Information 2014 ExitCare, [place].   ________________________________________________________________________   WHAT IS A BLOOD TRANSFUSION? Blood Transfusion Information  A transfusion is the replacement of blood or some of its parts. Blood is made up of multiple cells which provide different functions. Red blood cells carry oxygen and are used for blood loss replacement. White blood cells fight against infection. Platelets control bleeding. Plasma helps clot blood. Other blood products are available for specialized needs, such as hemophilia or other clotting disorders. BEFORE THE TRANSFUSION  Who gives blood for transfusions?  Healthy volunteers who are fully evaluated to make sure their blood is safe. This is blood bank blood. Transfusion therapy is the safest it has ever been in the practice of medicine. Before blood is taken from a donor, a complete history is taken to make sure that person has no history of diseases nor engages in risky social behavior (examples are intravenous drug use or sexual activity with multiple partners). The donor's travel history is screened to minimize risk  of transmitting infections, such as malaria. The donated blood is tested for signs of infectious diseases, such as HIV and hepatitis. The blood is then tested to be sure it is compatible with you in order to minimize the chance of a transfusion reaction. If you or a relative donates blood, this is often done in anticipation of surgery and is not appropriate for emergency situations. It takes many days to process the donated blood. RISKS AND COMPLICATIONS Although transfusion therapy is very safe and  saves many lives, the main dangers of transfusion include:  Getting an infectious disease. Developing a transfusion reaction. This is an allergic reaction to something in the blood you were given. Every precaution is taken to prevent this. The decision to have a blood transfusion has been considered carefully by your caregiver before blood is given. Blood is not given unless the benefits outweigh the risks. AFTER THE TRANSFUSION Right after receiving a blood transfusion, you will usually feel much better and more energetic. This is especially true if your red blood cells have gotten low (anemic). The transfusion raises the level of the red blood cells which carry oxygen, and this usually causes an energy increase. The nurse administering the transfusion will monitor you carefully for complications. HOME CARE INSTRUCTIONS  No special instructions are needed after a transfusion. You may find your energy is better. Speak with your caregiver about any limitations on activity for underlying diseases you may have. SEEK MEDICAL CARE IF:  Your condition is not improving after your transfusion. You develop redness or irritation at the intravenous (IV) site. SEEK IMMEDIATE MEDICAL CARE IF:  Any of the following symptoms occur over the next 12 hours: Shaking chills. You have a temperature by mouth above 102 F (38.9 C), not controlled by medicine. Chest, back, or muscle pain. People around you feel you are not acting correctly or are confused. Shortness of breath or difficulty breathing. Dizziness and fainting. You get a rash or develop hives. You have a decrease in urine output. Your urine turns a dark color or changes to pink, red, or brown. Any of the following symptoms occur over the next 10 days: You have a temperature by mouth above 102 F (38.9 C), not controlled by medicine. Shortness of breath. Weakness after normal activity. The white part of the eye turns yellow (jaundice). You have a  decrease in the amount of urine or are urinating less often. Your urine turns a dark color or changes to pink, red, or brown. Document Released: 01/03/2000 Document Revised: 03/30/2011 Document Reviewed: 08/22/2007 [facility] Patient Information 2014 [place].  _______________________________________________________________________

## 2021-01-23 NOTE — Progress Notes (Addendum)
COVID swab appointment: n/a  COVID Vaccine Completed: yes x4 Date COVID Vaccine completed: 02/09/19, 03/02/19 Has received booster: 10/26/19 COVID vaccine manufacturer: Pfizer      Date of COVID positive in last 90 days: no  PCP - Jacquiline Doe, MD Cardiologist - Chilton Si, MD  Chest x-ray - n/a EKG - 09/26/20 Epic Stress Test - 2004 ECHO - 10+ years ago Cardiac Cath - n/a Pacemaker/ICD device last checked: n/a Spinal Cord Stimulator: n/a  Sleep Study - n/a CPAP -   Fasting Blood Sugar - n/a Checks Blood Sugar _____ times a day  Blood Thinner Instructions: Aspirin Instructions: ASA 81, hold 7 days Last Dose:  Activity level: Can go up a flight of stairs and perform activities of daily living without stopping and without symptoms of chest pain or shortness of breath.    Anesthesia review: LBBB, CAD, PONV  Patient denies shortness of breath, fever, cough and chest pain at PAT appointment   Patient verbalized understanding of instructions that were given to them at the PAT appointment. Patient was also instructed that they will need to review over the PAT instructions again at home before surgery.

## 2021-01-24 ENCOUNTER — Encounter (HOSPITAL_COMMUNITY)
Admission: RE | Admit: 2021-01-24 | Discharge: 2021-01-24 | Disposition: A | Discharge status: Home / Self Care | Payer: Medicare Other | Source: Ambulatory Visit | Attending: Orthopedic Surgery | Admitting: Orthopedic Surgery

## 2021-01-24 ENCOUNTER — Encounter (HOSPITAL_COMMUNITY): Payer: Self-pay

## 2021-01-24 ENCOUNTER — Other Ambulatory Visit: Payer: Self-pay

## 2021-01-24 VITALS — BP 144/59 | HR 89 | Temp 98.0°F | Resp 12 | Ht 63.0 in | Wt 130.4 lb

## 2021-01-24 DIAGNOSIS — Z01812 Encounter for preprocedural laboratory examination: Secondary | ICD-10-CM | POA: Insufficient documentation

## 2021-01-24 DIAGNOSIS — Z01818 Encounter for other preprocedural examination: Secondary | ICD-10-CM

## 2021-01-24 DIAGNOSIS — I2510 Atherosclerotic heart disease of native coronary artery without angina pectoris: Secondary | ICD-10-CM | POA: Insufficient documentation

## 2021-01-24 LAB — BASIC METABOLIC PANEL
Anion gap: 10 (ref 5–15)
BUN: 19 mg/dL (ref 8–23)
CO2: 25 mmol/L (ref 22–32)
Calcium: 9.4 mg/dL (ref 8.9–10.3)
Chloride: 106 mmol/L (ref 98–111)
Creatinine, Ser: 0.87 mg/dL (ref 0.44–1.00)
GFR, Estimated: >60 mL/min (ref >60)
Glucose, Bld: 75 mg/dL (ref 70–99)
Potassium: 4.2 mmol/L (ref 3.5–5.1)
Sodium: 141 mmol/L (ref 135–145)

## 2021-01-24 LAB — CBC
HCT: 42.3 % (ref 36.0–46.0)
Hemoglobin: 14.1 g/dL (ref 12.0–15.0)
MCH: 30 pg (ref 26.0–34.0)
MCHC: 33.3 g/dL (ref 30.0–36.0)
MCV: 90 fL (ref 80.0–100.0)
Platelets: 205 10*3/uL (ref 150–400)
RBC: 4.7 MIL/uL (ref 3.87–5.11)
RDW: 12.4 % (ref 11.5–15.5)
WBC: 4.9 10*3/uL (ref 4.0–10.5)
nRBC: 0 % (ref 0.0–0.2)

## 2021-01-24 LAB — TYPE AND SCREEN
ABO/RH(D): A POS
Antibody Screen: NEGATIVE

## 2021-01-24 LAB — SURGICAL PCR SCREEN
MRSA, PCR: NEGATIVE
Staphylococcus aureus: POSITIVE — AB

## 2021-01-24 NOTE — Progress Notes (Signed)
[facility] results routed to Dr. Supple 

## 2021-02-05 NOTE — Anesthesia Preprocedure Evaluation (Addendum)
Anesthesia Evaluation  Patient identified by MRN, date of birth, ID band Patient awake    Reviewed: Allergy & Precautions, NPO status , Patient's Chart, lab work & pertinent test results  History of Anesthesia Complications (+) PONV and Family history of anesthesia reaction  Airway Mallampati: II  TM Distance: >3 FB Neck ROM: Full    Dental no notable dental hx. (+) Teeth Intact, Dental Advisory Given   Pulmonary    Pulmonary exam normal breath sounds clear to auscultation       Cardiovascular + CAD  Normal cardiovascular exam+ dysrhythmias  Rhythm:Regular Rate:Normal  Hx of LBBB   Neuro/Psych    GI/Hepatic GERD  Medicated and Controlled,  Endo/Other    Renal/GU      Musculoskeletal  (+) Arthritis , Osteoarthritis,    Abdominal   Peds  Hematology   Anesthesia Other Findings   Reproductive/Obstetrics                            Anesthesia Physical Anesthesia Plan  ASA: 2  Anesthesia Plan: General   Post-op Pain Management: Regional block and Minimal or no pain anticipated   Induction: Intravenous  PONV Risk Score and Plan: 4 or greater and Treatment may vary due to age or medical condition, Ondansetron, Scopolamine patch - Pre-op, Midazolam and Dexamethasone  Airway Management Planned: Oral ETT  Additional Equipment: None  Intra-op Plan:   Post-operative Plan: Extubation in OR  Informed Consent: I have reviewed the patients History and Physical, chart, labs and discussed the procedure including the risks, benefits and alternatives for the proposed anesthesia with the patient or authorized representative who has indicated his/her understanding and acceptance.     Dental advisory given  Plan Discussed with: CRNA and Anesthesiologist  Anesthesia Plan Comments: (GA w R ISB exparel)       Anesthesia Quick Evaluation

## 2021-02-06 ENCOUNTER — Encounter (HOSPITAL_COMMUNITY)
Admission: RE | Disposition: A | Discharge status: Home / Self Care | Payer: Self-pay | Source: Home / Self Care | Attending: Orthopedic Surgery

## 2021-02-06 ENCOUNTER — Inpatient Hospital Stay (HOSPITAL_COMMUNITY): Payer: Medicare Other | Admitting: Anesthesiology

## 2021-02-06 ENCOUNTER — Encounter (HOSPITAL_COMMUNITY): Payer: Self-pay | Admitting: Orthopedic Surgery

## 2021-02-06 ENCOUNTER — Ambulatory Visit (HOSPITAL_COMMUNITY)
Admission: RE | Admit: 2021-02-06 | Discharge: 2021-02-06 | Disposition: A | Discharge status: Home / Self Care | Payer: Medicare Other | Attending: Orthopedic Surgery | Admitting: Orthopedic Surgery

## 2021-02-06 ENCOUNTER — Inpatient Hospital Stay (HOSPITAL_COMMUNITY): Payer: Medicare Other | Admitting: Physician Assistant

## 2021-02-06 DIAGNOSIS — M19011 Primary osteoarthritis, right shoulder: Secondary | ICD-10-CM | POA: Insufficient documentation

## 2021-02-06 DIAGNOSIS — Z79899 Other long term (current) drug therapy: Secondary | ICD-10-CM | POA: Diagnosis not present

## 2021-02-06 DIAGNOSIS — Z472 Encounter for removal of internal fixation device: Secondary | ICD-10-CM | POA: Diagnosis not present

## 2021-02-06 DIAGNOSIS — I2510 Atherosclerotic heart disease of native coronary artery without angina pectoris: Secondary | ICD-10-CM | POA: Diagnosis not present

## 2021-02-06 DIAGNOSIS — M25711 Osteophyte, right shoulder: Secondary | ICD-10-CM | POA: Diagnosis not present

## 2021-02-06 DIAGNOSIS — Z20822 Contact with and (suspected) exposure to covid-19: Secondary | ICD-10-CM | POA: Diagnosis not present

## 2021-02-06 DIAGNOSIS — Z96611 Presence of right artificial shoulder joint: Secondary | ICD-10-CM | POA: Diagnosis not present

## 2021-02-06 DIAGNOSIS — G8918 Other acute postprocedural pain: Secondary | ICD-10-CM | POA: Diagnosis not present

## 2021-02-06 DIAGNOSIS — T8484XA Pain due to internal orthopedic prosthetic devices, implants and grafts, initial encounter: Secondary | ICD-10-CM | POA: Diagnosis not present

## 2021-02-06 DIAGNOSIS — K219 Gastro-esophageal reflux disease without esophagitis: Secondary | ICD-10-CM | POA: Insufficient documentation

## 2021-02-06 HISTORY — PX: REVERSE SHOULDER ARTHROPLASTY: SHX5054

## 2021-02-06 LAB — SARS CORONAVIRUS 2 BY RT PCR (HOSPITAL ORDER, PERFORMED IN ~~LOC~~ HOSPITAL LAB): SARS Coronavirus 2: NEGATIVE

## 2021-02-06 SURGERY — ARTHROPLASTY, SHOULDER, TOTAL, REVERSE
Anesthesia: General | Site: Shoulder | Laterality: Right

## 2021-02-06 MED ORDER — PHENYLEPHRINE 40 MCG/ML (10ML) SYRINGE FOR IV PUSH (FOR BLOOD PRESSURE SUPPORT)
PREFILLED_SYRINGE | INTRAVENOUS | Status: AC
  Filled 2021-02-06: qty 10

## 2021-02-06 MED ORDER — EPHEDRINE SULFATE (PRESSORS) 50 MG/ML IJ SOLN
INTRAMUSCULAR | Status: DC | PRN
  Administered 2021-02-06 (×3): 5 mg via INTRAVENOUS

## 2021-02-06 MED ORDER — ROCURONIUM BROMIDE 10 MG/ML (PF) SYRINGE
PREFILLED_SYRINGE | INTRAVENOUS | Status: AC
  Filled 2021-02-06: qty 10

## 2021-02-06 MED ORDER — LIDOCAINE HCL (PF) 2 % IJ SOLN
INTRAMUSCULAR | Status: AC
  Filled 2021-02-06: qty 5

## 2021-02-06 MED ORDER — PHENYLEPHRINE HCL-NACL 20-0.9 MG/250ML-% IV SOLN
INTRAVENOUS | Status: AC
  Filled 2021-02-06: qty 250

## 2021-02-06 MED ORDER — 0.9 % SODIUM CHLORIDE (POUR BTL) OPTIME
TOPICAL | Status: DC | PRN
  Administered 2021-02-06: 1000 mL

## 2021-02-06 MED ORDER — SUGAMMADEX SODIUM 200 MG/2ML IV SOLN
INTRAVENOUS | Status: DC | PRN
  Administered 2021-02-06: 200 mg via INTRAVENOUS

## 2021-02-06 MED ORDER — PHENYLEPHRINE HCL-NACL 20-0.9 MG/250ML-% IV SOLN
INTRAVENOUS | Status: DC | PRN
  Administered 2021-02-06: 40 ug/min via INTRAVENOUS

## 2021-02-06 MED ORDER — PROPOFOL 10 MG/ML IV BOLUS
INTRAVENOUS | Status: DC | PRN
Start: 2021-02-06 — End: 2021-02-06
  Administered 2021-02-06: 90 mg via INTRAVENOUS

## 2021-02-06 MED ORDER — SCOPOLAMINE 1 MG/3DAYS TD PT72
1.0000 | MEDICATED_PATCH | Freq: Once | TRANSDERMAL | Status: DC
  Administered 2021-02-06: 1.5 mg via TRANSDERMAL
  Filled 2021-02-06: qty 1

## 2021-02-06 MED ORDER — ORAL CARE MOUTH RINSE: 15.0000 mL | Freq: Once | OROMUCOSAL | Status: AC

## 2021-02-06 MED ORDER — ROCURONIUM BROMIDE 10 MG/ML (PF) SYRINGE
PREFILLED_SYRINGE | INTRAVENOUS | Status: DC | PRN
  Administered 2021-02-06: 40 mg via INTRAVENOUS

## 2021-02-06 MED ORDER — CHLORHEXIDINE GLUCONATE 0.12 % MT SOLN
15.0000 mL | Freq: Once | OROMUCOSAL | Status: AC
  Administered 2021-02-06: 15 mL via OROMUCOSAL

## 2021-02-06 MED ORDER — MIDAZOLAM HCL 5 MG/5ML IJ SOLN
INTRAMUSCULAR | Status: DC | PRN
  Administered 2021-02-06 (×2): 1 mg via INTRAVENOUS

## 2021-02-06 MED ORDER — MIDAZOLAM HCL 2 MG/2ML IJ SOLN
INTRAMUSCULAR | Status: AC
  Filled 2021-02-06: qty 2

## 2021-02-06 MED ORDER — LIDOCAINE 2% (20 MG/ML) 5 ML SYRINGE
INTRAMUSCULAR | Status: DC | PRN
  Administered 2021-02-06: 100 mg via INTRAVENOUS

## 2021-02-06 MED ORDER — BUPIVACAINE HCL (PF) 0.5 % IJ SOLN
INTRAMUSCULAR | Status: DC | PRN
  Administered 2021-02-06: 15 mL via PERINEURAL

## 2021-02-06 MED ORDER — NAPROXEN 500 MG PO TABS: 500.0000 mg | ORAL_TABLET | Freq: Two times a day (BID) | ORAL | 1 refills | Status: DC

## 2021-02-06 MED ORDER — CEFAZOLIN SODIUM-DEXTROSE 2-4 GM/100ML-% IV SOLN
2.0000 g | INTRAVENOUS | Status: AC
  Administered 2021-02-06: 2 g via INTRAVENOUS
  Filled 2021-02-06: qty 100

## 2021-02-06 MED ORDER — FENTANYL CITRATE PF 50 MCG/ML IJ SOSY: 25.0000 ug | PREFILLED_SYRINGE | INTRAMUSCULAR | Status: DC | PRN

## 2021-02-06 MED ORDER — BUPIVACAINE LIPOSOME 1.3 % IJ SUSP
INTRAMUSCULAR | Status: DC | PRN
Start: 2021-02-06 — End: 2021-02-06
  Administered 2021-02-06: 10 mL via PERINEURAL

## 2021-02-06 MED ORDER — LACTATED RINGERS IV BOLUS
500.0000 mL | Freq: Once | INTRAVENOUS | Status: AC
  Administered 2021-02-06: 500 mL via INTRAVENOUS

## 2021-02-06 MED ORDER — PROPOFOL 10 MG/ML IV BOLUS
INTRAVENOUS | Status: AC
  Filled 2021-02-06: qty 20

## 2021-02-06 MED ORDER — ONDANSETRON HCL 4 MG/2ML IJ SOLN: 4.0000 mg | Freq: Once | INTRAMUSCULAR | Status: DC | PRN

## 2021-02-06 MED ORDER — ONDANSETRON HCL 4 MG/2ML IJ SOLN
INTRAMUSCULAR | Status: AC
  Filled 2021-02-06: qty 2

## 2021-02-06 MED ORDER — ONDANSETRON HCL 4 MG/2ML IJ SOLN
INTRAMUSCULAR | Status: DC | PRN
  Administered 2021-02-06: 4 mg via INTRAVENOUS

## 2021-02-06 MED ORDER — ONDANSETRON HCL 4 MG PO TABS: 4.0000 mg | ORAL_TABLET | Freq: Three times a day (TID) | ORAL | 0 refills | Status: DC | PRN

## 2021-02-06 MED ORDER — EPHEDRINE 5 MG/ML INJ
INTRAVENOUS | Status: AC
  Filled 2021-02-06: qty 5

## 2021-02-06 MED ORDER — ACETAMINOPHEN 10 MG/ML IV SOLN: 1000.0000 mg | Freq: Once | INTRAVENOUS | Status: DC | PRN

## 2021-02-06 MED ORDER — DEXAMETHASONE SODIUM PHOSPHATE 4 MG/ML IJ SOLN
INTRAMUSCULAR | Status: DC | PRN
  Administered 2021-02-06: 8 mg via INTRAVENOUS

## 2021-02-06 MED ORDER — DEXAMETHASONE SODIUM PHOSPHATE 10 MG/ML IJ SOLN
INTRAMUSCULAR | Status: AC
  Filled 2021-02-06: qty 1

## 2021-02-06 MED ORDER — FENTANYL CITRATE (PF) 100 MCG/2ML IJ SOLN
INTRAMUSCULAR | Status: DC | PRN
Start: 2021-02-06 — End: 2021-02-06
  Administered 2021-02-06 (×2): 50 ug via INTRAVENOUS

## 2021-02-06 MED ORDER — LACTATED RINGERS IV BOLUS
250.0000 mL | Freq: Once | INTRAVENOUS | Status: AC
  Administered 2021-02-06: 250 mL via INTRAVENOUS

## 2021-02-06 MED ORDER — VANCOMYCIN HCL 1000 MG IV SOLR
INTRAVENOUS | Status: DC | PRN
  Administered 2021-02-06: 1000 mg via TOPICAL

## 2021-02-06 MED ORDER — FENTANYL CITRATE (PF) 250 MCG/5ML IJ SOLN
INTRAMUSCULAR | Status: AC
  Filled 2021-02-06: qty 5

## 2021-02-06 MED ORDER — TRANEXAMIC ACID-NACL 1000-0.7 MG/100ML-% IV SOLN
1000.0000 mg | INTRAVENOUS | Status: AC
  Administered 2021-02-06: 1000 mg via INTRAVENOUS
  Filled 2021-02-06: qty 100

## 2021-02-06 MED ORDER — VANCOMYCIN HCL 1000 MG IV SOLR
INTRAVENOUS | Status: AC
  Filled 2021-02-06: qty 20

## 2021-02-06 MED ORDER — PHENYLEPHRINE 40 MCG/ML (10ML) SYRINGE FOR IV PUSH (FOR BLOOD PRESSURE SUPPORT)
PREFILLED_SYRINGE | INTRAVENOUS | Status: DC | PRN
  Administered 2021-02-06: 120 ug via INTRAVENOUS
  Administered 2021-02-06: 40 ug via INTRAVENOUS
  Administered 2021-02-06: 80 ug via INTRAVENOUS

## 2021-02-06 MED ORDER — STERILE WATER FOR IRRIGATION IR SOLN
Status: DC | PRN
  Administered 2021-02-06: 1000 mL

## 2021-02-06 MED ORDER — METHOCARBAMOL 500 MG PO TABS: 500.0000 mg | ORAL_TABLET | Freq: Four times a day (QID) | ORAL | 0 refills | Status: DC | PRN

## 2021-02-06 MED ORDER — LACTATED RINGERS IV SOLN: INTRAVENOUS | Status: DC

## 2021-02-06 SURGICAL SUPPLY — 79 items
ADH SKN CLS APL DERMABOND .7 (GAUZE/BANDAGES/DRESSINGS) ×1
AID PSTN UNV HD RSTRNT DISP (MISCELLANEOUS) ×1
BAG COUNTER SPONGE SURGICOUNT (BAG) IMPLANT
BAG SPEC THK2 15X12 ZIP CLS (MISCELLANEOUS) ×1
BAG SPNG CNTER NS LX DISP (BAG)
BAG ZIPLOCK 12X15 (MISCELLANEOUS) ×2 IMPLANT
BLADE SAW SGTL 83.5X18.5 (BLADE) ×2 IMPLANT
BNDG COHESIVE 4X5 TAN ST LF (GAUZE/BANDAGES/DRESSINGS) ×2 IMPLANT
BSPLAT GLND +2X24 MDLR (Joint) ×1 IMPLANT
COOLER ICEMAN CLASSIC (MISCELLANEOUS) ×2 IMPLANT
COVER BACK TABLE 60X90IN (DRAPES) ×2 IMPLANT
COVER SURGICAL LIGHT HANDLE (MISCELLANEOUS) ×2 IMPLANT
CUP SUT UNIV REVERS 36 NEUTRAL (Cup) ×1 IMPLANT
DERMABOND ADVANCED (GAUZE/BANDAGES/DRESSINGS) ×1
DERMABOND ADVANCED .7 DNX12 (GAUZE/BANDAGES/DRESSINGS) ×1 IMPLANT
DRAPE INCISE IOBAN 66X45 STRL (DRAPES) IMPLANT
DRAPE ORTHO SPLIT 77X108 STRL (DRAPES) ×4
DRAPE SHEET LG 3/4 BI-LAMINATE (DRAPES) ×2 IMPLANT
DRAPE SURG 17X11 SM STRL (DRAPES) ×2 IMPLANT
DRAPE SURG ORHT 6 SPLT 77X108 (DRAPES) ×2 IMPLANT
DRAPE TOP 10253 STERILE (DRAPES) ×2 IMPLANT
DRAPE U-SHAPE 47X51 STRL (DRAPES) ×2 IMPLANT
DRESSING AQUACEL AG SP 3.5X10 (GAUZE/BANDAGES/DRESSINGS) IMPLANT
DRESSING AQUACEL AG SP 3.5X6 (GAUZE/BANDAGES/DRESSINGS) ×1 IMPLANT
DRSG AQUACEL AG ADV 3.5X 6 (GAUZE/BANDAGES/DRESSINGS) ×1 IMPLANT
DRSG AQUACEL AG ADV 3.5X10 (GAUZE/BANDAGES/DRESSINGS) IMPLANT
DRSG AQUACEL AG SP 3.5X10 (GAUZE/BANDAGES/DRESSINGS) ×2
DRSG AQUACEL AG SP 3.5X6 (GAUZE/BANDAGES/DRESSINGS) ×2
DRSG TEGADERM 8X12 (GAUZE/BANDAGES/DRESSINGS) ×2 IMPLANT
DURAPREP 26ML APPLICATOR (WOUND CARE) ×2 IMPLANT
ELECT BLADE TIP CTD 4 INCH (ELECTRODE) ×2 IMPLANT
ELECT PENCIL ROCKER SW 15FT (MISCELLANEOUS) ×2 IMPLANT
ELECT REM PT RETURN 15FT ADLT (MISCELLANEOUS) ×2 IMPLANT
FACESHIELD WRAPAROUND (MASK) ×8 IMPLANT
FACESHIELD WRAPAROUND OR TEAM (MASK) ×4 IMPLANT
GLENOID UNI REV MOD 24 +2 LAT (Joint) ×1 IMPLANT
GLENOSPHERE 36 +4 LAT/24 (Joint) ×1 IMPLANT
GLOVE SRG 8 PF TXTR STRL LF DI (GLOVE) ×1 IMPLANT
GLOVE SURG ENC MOIS LTX SZ7 (GLOVE) ×2 IMPLANT
GLOVE SURG ENC MOIS LTX SZ7.5 (GLOVE) ×2 IMPLANT
GLOVE SURG UNDER POLY LF SZ7 (GLOVE) ×2 IMPLANT
GLOVE SURG UNDER POLY LF SZ8 (GLOVE) ×2
GOWN STRL REUS W/TWL LRG LVL3 (GOWN DISPOSABLE) ×4 IMPLANT
INSERT HUMERAL UNI REVERS 36 3 (Insert) ×1 IMPLANT
KIT BASIN OR (CUSTOM PROCEDURE TRAY) ×2 IMPLANT
KIT TURNOVER KIT A (KITS) IMPLANT
MANIFOLD NEPTUNE II (INSTRUMENTS) ×2 IMPLANT
NDL TAPERED W/ NITINOL LOOP (MISCELLANEOUS) ×1 IMPLANT
NEEDLE TAPERED W/ NITINOL LOOP (MISCELLANEOUS) ×2 IMPLANT
NS IRRIG 1000ML POUR BTL (IV SOLUTION) ×2 IMPLANT
PACK SHOULDER (CUSTOM PROCEDURE TRAY) ×2 IMPLANT
PAD ARMBOARD 7.5X6 YLW CONV (MISCELLANEOUS) ×2 IMPLANT
PAD COLD SHLDR WRAP-ON (PAD) ×2 IMPLANT
PIN NITINOL TARGETER 2.8 (PIN) IMPLANT
PIN SET MODULAR GLENOID SYSTEM (PIN) ×1 IMPLANT
RESTRAINT HEAD UNIVERSAL NS (MISCELLANEOUS) ×2 IMPLANT
SCREW CENTRAL MODULAR 25 (Screw) ×1 IMPLANT
SCREW PERI LOCK 5.5X16 (Screw) ×1 IMPLANT
SCREW PERI LOCK 5.5X32 (Screw) ×1 IMPLANT
SCREW PERIPHERAL 5.5X20 LOCK (Screw) ×2 IMPLANT
SLING ARM FOAM STRAP LRG (SOFTGOODS) IMPLANT
SLING ARM FOAM STRAP MED (SOFTGOODS) ×1 IMPLANT
SPONGE T-LAP 18X18 ~~LOC~~+RFID (SPONGE) ×2 IMPLANT
SPONGE T-LAP 4X18 ~~LOC~~+RFID (SPONGE) ×2 IMPLANT
STEM HUMERAL UNIVERS SZ8 (Stem) ×1 IMPLANT
SUCTION FRAZIER HANDLE 12FR (TUBING) ×2
SUCTION TUBE FRAZIER 12FR DISP (TUBING) ×1 IMPLANT
SUT FIBERWIRE #2 38 T-5 BLUE (SUTURE)
SUT MNCRL AB 3-0 PS2 18 (SUTURE) ×2 IMPLANT
SUT MON AB 2-0 CT1 36 (SUTURE) ×2 IMPLANT
SUT VIC AB 1 CT1 36 (SUTURE) ×2 IMPLANT
SUTURE FIBERWR #2 38 T-5 BLUE (SUTURE) IMPLANT
SUTURE TAPE 1.3 40 TPR END (SUTURE) ×2 IMPLANT
SUTURETAPE 1.3 40 TPR END (SUTURE) ×4
TOWEL OR 17X26 10 PK STRL BLUE (TOWEL DISPOSABLE) ×2 IMPLANT
TOWEL OR NON WOVEN STRL DISP B (DISPOSABLE) ×2 IMPLANT
TUBE SUCTION HIGH CAP CLEAR NV (SUCTIONS) ×2 IMPLANT
WATER STERILE IRR 1000ML POUR (IV SOLUTION) ×4 IMPLANT
YANKAUER SUCT BULB TIP 10FT TU (MISCELLANEOUS) ×2 IMPLANT

## 2021-02-06 NOTE — Op Note (Addendum)
02/06/2021  9:28 AM  PATIENT:   Dawn Perry  71 y.o. female  PRE-OPERATIVE DIAGNOSIS:  Right shoulder osteoarthritis, retained hardware  POST-OPERATIVE DIAGNOSIS: Same  PROCEDURE:  #1 removal of retained hardware in the form of a syndesmotic screw from the anterior glenoid  2.  Right shoulder reverse arthroplasty utilizing a press-fit size 8 Arthrex stem with a neutral metaphysis, +3 constrained polyethylene insert, 36/+4 glenosphere on a small/+2 baseplate  SURGEON:  Aldridge Krzyzanowski, Vania Rea M.D.  ASSISTANTS: Ralene Bathe, PA-C  ANESTHESIA:   General endotracheal and interscalene block with Exparel  EBL: 150 cc  SPECIMEN: None  Drains: None   PATIENT DISPOSITION:  PACU - hemodynamically stable.    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  Due to her increasing pain and functional mentation she is brought to the operating room at this time for planned right shoulder reverse arthroplasty.  Preoperatively, I counseled the patient regarding treatment options and risks versus benefits thereof.  Possible surgical complications were all reviewed including potential for bleeding, infection, neurovascular injury, persistent pain, loss of motion, anesthetic complication, failure of the implant, and possible need for additional surgery. They understand and accept and agrees with our planned procedure.   Procedure in detail:  After undergoing routine preop evaluation the patient received prophylactic antibiotics and interscalene block with Exparel was established in the holding area by the anesthesia  department.  Patient was subsequently placed supine on the operating table and underwent the smooth induction of a general endotracheal anesthesia.  Placed into the beachchair position and appropriately padded and protected.  The right shoulder girdle region was sterilely prepped and draped in standard fashion.  Timeout was called.  An anterior deltopectoral approach was made to the right shoulder through an approximately 10 cm incision.  This did cross her previous anterior shoulder incision which was well-healed and there had been enough healing and remodeling that we were comfortable traversing this previous incision.  Skin flaps were elevated and brisk bleeding was noted from all cut surfaces demonstrating complete revascularization of the skin and subcutaneous tissues.  Skin flaps elevated and with dissection we identified the differential alignment of the deltoid and pectoral fibers allowing [place] to identify the deltopectoral interval and we open this from proximal to distal with the remnant of the cephalic vein taken laterally.  Adhesions were then divided beneath the deltoid and the pectoralis was then mobilized and retracted medially and a self-retaining tractor was then placed at this point.  We elevated the upper centimeter the pectoralis major tendon to enhance exposure and identify the area the previous anterior reconstruction.  Given the nature of her previous reconstruction begin the deep dissection proximally along the rotator interval and then separated the remnant of the subscapularis from its lesser tuberosity insertion and elevated this with subperiosteal dissection and then tagged the margin of the subscap and capsule with a pair of suture tape sutures.  We then identified a long head biceps tendon which we tenodesed at the upper border the pectoralis major tendon the proximal segment was unroofed and excised.  Capsular attachments were then divided from the anterior and infra margins of the  humeral neck and  the humeral head was then delivered through the wound.  An extra medullary guide was then used to outline a proposed humeral head resection at approximately 20 degrees retroversion and this was then performed with an oscillating saw care taken to protect the surrounding soft tissues.  Rondure was then used to remove the peripheral osteophytes and then a metal cap was then placed over the cut proximal humeral surface.  We then exposed the glenoid with appropriate retractors.  We did a careful dissection anteriorly elevating the remnant of the anterior capsule and labrum such that in a subperiosteal fashion we are available to expose the previously transferred coracoid process with its retained screw and this was then dissected free and the screw was then removed with a large fragment screwdriver.  We then removed the remnants of the labrum circumferentially and gain complete visualization of the peripheral glenoid which was markedly dished out with prominent peripheral osteophytes which were removed with rondure.  A guidepin was then directed into the center of the glenoid in neutral alignment the glenoid was then reamed with the central followed by the peripheral reamer to a stable subchondral bony bed and the remnant of the peripheral osteophytes were then removed.  Glenoid preparation completed with central drill and tapped for a 25 mm lag screw.  Our baseplate was then assembled with vancomycin powder applied to the threads of the lag screw the baseplate was then inserted with excellent purchase and fixation.  The peripheral locking screws were all then placed using standard technique with excellent purchase and fixation.  A 36/+4 glenosphere was then impacted on the baseplate and a central locking screw was placed.  We then returned our attention to the humerus with a metal cap was then removed and the canal was opened and we ultimately broached up to a size 8 stem at 20 degrees retroversion.  A  neutral metaphyseal reaming guide was then utilized in the metaphysis was prepared.  An initial attempt at reduction showed excessive tension and so we rebroached seating the implant approximately 2 mm deep with excellent purchase and fixation and then reprepped the metaphysis and at this point the trial showed excellent fit fixation and soft tissue balance.  At this point the trial was then removed.  Our final implant was assembled.  The canal was cleaned and dried and vancomycin powder was then sprayed liberally into the canal and a final implant was then seated.  This should good motion stability and soft tissue balance with a +3 liner.  Due to the deficiency of the anterior soft tissues we utilized a constrained +3 poly-.  The trial was removed the final liner was impacted and final reduction showed excellent motion stability and soft tissue balance.  The wound was then copiously irrigated.  Final hemostasis was obtained.  The deltopectoral interval was reapproximated with a series of figure-of-eight number Vicryl sutures.  2-0 Monocryl used to close the subcu layer and intracuticular 3-0 Monocryl used to close the skin followed by Dermabond.  Aquacel dressing applied to the right shoulder right arm was placed into a sling and the patient was awakened, extubated, and taken to the recovery room in stable condition.  Ralene Bathe, PA-C was utilized as an Geophysicist/field seismologist throughout this case, essential for help with positioning the patient, positioning extremity, tissue manipulation, implantation of the prosthesis, suture management, wound closure, and intraoperative decision-making.  Senaida Lange MD   Contact # [phone]

## 2021-02-06 NOTE — Progress Notes (Signed)
Occupational Therapy Evaluation  Patient is a 71 year old female s/p shoulder replacement without functional use of right dominant upper extremity secondary to effects of surgery and interscalene block and shoulder precautions. Therapist provided education and instruction to patient and spouse in regards to exercises, precautions, positioning, donning upper extremity clothing and bathing while maintaining shoulder precautions, ice and edema management and donning/doffing sling. Patient and spouse verbalized understanding and demonstrated as needed. Patient needed assistance to donn shirt, underwear, pants, socks and shoes and provided with instruction on compensatory strategies to perform ADLs. Patient to follow up with MD for further therapy needs.     02/06/21 1231  OT Visit Information  Last OT Received On 02/06/21  Assistance Needed +1  History of Present Illness Patient is a 71 year old female s/p R reverse total shoulder arthroplasty. PMH includes fracture L arm, heart murur  Precautions  Precautions Shoulder  Type of Shoulder Precautions If sitting in controlled environment, ok to come out of sling to give neck a break. Please sleep in it to protect until follow up in office. OK to use operative arm for feeding, hygiene and ADLs. Ok to instruct Pendulums and lap slides as exercises. Ok to use operative arm within the following parameters for ADL purposes Ok for PROM, AAROM, AROM within pain tolerance and within the following ROM  ER 20  ABD 45 FE 60. AROM elbow, wrist, hand ok  Shoulder Interventions Shoulder sling/immobilizer;Off for dressing/bathing/exercises  Precaution Booklet Issued Yes (comment)  Required Braces or Orthoses Sling  Restrictions  Weight Bearing Restrictions Yes  Other Position/Activity Restrictions NWB R UE  Home Living  Family/patient expects to be discharged to: Private residence  Living Arrangements Spouse/significant other  Available Help at Discharge Family   Type of Home House  Home Access Stairs to enter  Entrance Stairs-Number of Steps 2  Entrance Stairs-Rails None  Home Layout Two level;Able to live on main level with bedroom/bathroom (uses walk in shower on second floor)  Printmaker  Prior Function  Prior Level of Function  Independent/Modified Independent  Communication  Communication No difficulties  Pain Assessment  Pain Assessment Faces  Faces Pain Scale 2  Pain Location R UE  Pain Descriptors / Indicators Heaviness;Numbness  Pain Intervention(s) Monitored during session  Cognition  Arousal/Alertness Awake/alert  Behavior During Therapy WFL for tasks assessed/performed  Overall Cognitive Status Within Functional Limits for tasks assessed  Upper Extremity Assessment  Upper Extremity Assessment RUE deficits/detail  RUE Deficits / Details + nerve block  Lower Extremity Assessment  Lower Extremity Assessment Overall WFL for tasks assessed  Cervical / Trunk Assessment  Cervical / Trunk Assessment Normal  ADL  Overall ADL's  Needs assistance/impaired  Upper Body Bathing Minimal assistance;Standing  Lower Body Bathing Minimal assistance;Sit to/from stand;Sitting/lateral leans  Upper Body Dressing  Minimal assistance;Standing;Cueing for sequencing  Upper Body Dressing Details (indicate cue type and reason) Min A to thread R UE  Lower Body Dressing Minimal assistance;Sitting/lateral leans;Sit to/from stand  Lower Body Dressing Details (indicate cue type and reason) Patient had assist with socks otherwise able to thread clothing and pull up over hips  Toilet Transfer Independent  Toileting- Clothing Manipulation and Hygiene Independent  Functional mobility during ADLs Independent  General ADL Comments Patient and spouse educated on shoulder precautions and how to maintain during self care tasks.  Bed Mobility  General bed mobility comments in chair  Transfers  Overall transfer  level Independent  Balance  Overall balance assessment Independent  Exercises  Exercises Shoulder  Shoulder Instructions  Donning/doffing shirt without moving shoulder Minimal assistance;Caregiver independent with task;Patient able to independently direct caregiver  Method for sponge bathing under operated UE Minimal assistance;Patient able to independently direct caregiver;Caregiver independent with task  Donning/doffing sling/immobilizer Moderate assistance;Patient able to independently direct caregiver;Caregiver independent with task  Correct positioning of sling/immobilizer Minimal assistance;Caregiver independent with task;Patient able to independently direct caregiver  Pendulum exercises (written home exercise program) Caregiver independent with task;Patient able to independently direct caregiver  ROM for elbow, wrist and digits of operated UE Caregiver independent with task;Patient able to independently direct caregiver  Sling wearing schedule (on at all times/off for ADL's) Patient able to independently direct caregiver;Caregiver independent with task  Proper positioning of operated UE when showering Caregiver independent with task;Patient able to independently direct caregiver  Dressing change  (N/A)  Positioning of UE while sleeping Patient able to independently direct caregiver;Caregiver independent with task  OT - End of Session  Equipment Utilized During Treatment Other (comment) (sling)  Activity Tolerance Patient tolerated treatment well  Patient left in chair;with family/visitor present  Nurse Communication Other (comment) (OT complete)  OT Assessment  OT Recommendation/Assessment Progress rehab of shoulder as ordered by MD at follow-up appointment  OT Visit Diagnosis Pain  Pain - Right/Left Right  Pain - part of body Shoulder  OT Problem List Pain;Impaired UE functional use;Decreased knowledge of precautions  AM-PAC OT "6 Clicks" Daily Activity Outcome Measure (Version  2)  Help from another person eating meals? 4  Help from another person taking care of personal grooming? 4  Help from another person toileting, which includes using toliet, bedpan, or urinal? 4  Help from another person bathing (including washing, rinsing, drying)? 3  Help from another person to put on and taking off regular upper body clothing? 3  Help from another person to put on and taking off regular lower body clothing? 3  6 Click Score 21  Progressive Mobility  What is the highest level of mobility based on the progressive mobility assessment? Level 6 (Walks independently in room and hall) - Balance while walking in room without assist - Complete  Activity Ambulated independently in room  OT Recommendation  Follow Up Recommendations Follow physician's recommendations for discharge plan and follow up therapies  Assistance recommended at discharge PRN  OT Equipment None recommended by OT  Acute Rehab OT Goals  Patient Stated Goal Home  OT Goal Formulation All assessment and education complete, DC therapy  OT Time Calculation  OT Start Time (ACUTE ONLY) 1110  OT Stop Time (ACUTE ONLY) 1136  OT Time Calculation (min) 26 min  OT General Charges  $OT Visit 1 Visit  OT Evaluation  $OT Eval Low Complexity 1 Low  OT Treatments  $Self Care/Home Management  8-22 mins  Written Expression  Dominant Hand Right   Marlyce Huge OT OT pager: [phone]

## 2021-02-06 NOTE — H&P (Signed)
Jamse Mead    Chief Complaint: Right shoulder osteoarthritis, retained hardware HPI: The patient is a 71 y.o. female with a remote history of recurrent right shoulder instability, status post open anterior reconstruction with coracoid transfer, now with severe osteoarthritis and associated chronic rotator cuff dysfunction with retained hardware in the form of a screw traversing the glenoid.  Due to her increasing functional imitations and failure to respond to prolonged attempts at conservative management, she is brought to the operating room at this time for planned right shoulder reverse arthroplasty  Past Medical History:  Diagnosis Date   CAD in native artery 09/26/2020   Elevated blood pressure reading 09/26/2020   Family history of adverse reaction to anesthesia    mother had problems with N/V   GERD (gastroesophageal reflux disease)    B hands   H/O scarlet fever    age 17   Heart murmur    History of chicken pox    Hyperlipidemia    LBBB (left bundle branch block)    Osteoarthritis    PONV (postoperative nausea and vomiting)     Past Surgical History:  Procedure Laterality Date   Broke Left forearm     fracture left arm surgery      right shoulder surgery      Right shoulder tear  01/20/1988   TOTAL HIP ARTHROPLASTY Right 10/09/2020   Procedure: TOTAL HIP ARTHROPLASTY ANTERIOR APPROACH;  Surgeon: Ollen Gross, MD;  Location: [facility];  Service: Orthopedics;  Laterality: Right;    Family History  Problem Relation Age of Onset   Arthritis Father    Parkinson's disease Father    Hyperlipidemia Mother    Other Mother        abnormal EKG's   Arthritis Mother    Stroke Mother        hemorrhagic   Parkinsonism Other    Coronary artery disease Brother        25   Cancer Neg Hx     Social History:  reports that she has never smoked. She has never used smokeless tobacco. She reports that she does not drink alcohol and does not use drugs.   Medications Prior to  Admission  Medication Sig Dispense Refill   aspirin EC 81 MG tablet Take 81 mg by mouth daily. Swallow whole.     Calcium Citrate-Vitamin D (CITRACAL + D PO) Take 1 tablet by mouth daily.     Coenzyme Q10 300 MG CAPS Take 300 mg by mouth daily.     Multiple Vitamin (MULTI-VITAMIN DAILY) TABS Take 1 tablet by mouth daily.     rosuvastatin (CRESTOR) 20 MG tablet Take 1 tablet (20 mg total) by mouth daily. 90 tablet 3   acetaminophen (TYLENOL) 500 MG tablet Take 1,000 mg by mouth every 6 (six) hours as needed for moderate pain.     HYDROcodone-acetaminophen (NORCO/VICODIN) 5-325 MG tablet Take 1-2 tablets by mouth every 6 (six) hours as needed for severe pain. (Patient not taking: Reported on 01/16/2021) 42 tablet 0   methocarbamol (ROBAXIN) 500 MG tablet Take 1 tablet (500 mg total) by mouth every 6 (six) hours as needed for muscle spasms. (Patient not taking: Reported on 01/16/2021) 40 tablet 0   traMADol (ULTRAM) 50 MG tablet Take 1-2 tablets (50-100 mg total) by mouth every 6 (six) hours as needed for moderate pain. (Patient not taking: Reported on 01/16/2021) 40 tablet 0     Physical Exam: Right shoulder demonstrates painful and profoundly restricted mobility  as noted at her recent office visit.  Well-healed anterior incision.  Neurovascular intact in the right upper extremity.  Radiographs  Recent plain films confirm complete obliteration of the joint space with subchondral sclerosis and peripheral osteophyte formation, all characteristic with sequela of recurrent instability which is now progressed to severe osteoarthritis.  Retained screw traversing the glenoid is noted.  Vitals  Temp:  [98.2 F (36.8 C)] 98.2 F (36.8 C) (01/19 0557) Pulse Rate:  [73] 73 (01/19 0557) Resp:  [21] 21 (01/19 0557) BP: (135)/(62) 135/62 (01/19 0557) SpO2:  [98 %] 98 % (01/19 0557)  Assessment/Plan  Impression: Right shoulder osteoarthritis, retained hardware  Plan of Action: Procedure(s): Right  Reverse shoulder arthroplasty; hardware removal  Sieanna Vanstone M Penne Rosenstock 02/06/2021, 6:43 AM Contact # [phone]

## 2021-02-06 NOTE — Anesthesia Postprocedure Evaluation (Signed)
Anesthesia Post Note  Patient: TALONDA ARTIST  Procedure(s) Performed: Right Reverse shoulder arthroplasty; hardware removal (Right: Shoulder)     Patient location during evaluation: PACU Anesthesia Type: General and Regional Level of consciousness: awake and alert Pain management: pain level controlled Vital Signs Assessment: post-procedure vital signs reviewed and stable Respiratory status: spontaneous breathing, nonlabored ventilation, respiratory function stable and patient connected to nasal cannula oxygen Cardiovascular status: blood pressure returned to baseline and stable Postop Assessment: no apparent nausea or vomiting Anesthetic complications: no   No notable events documented.  Last Vitals:  Vitals:   02/06/21 1033 02/06/21 1157  BP: (!) 133/97 (!) 118/59  Pulse: 68 73  Resp: 16 18  Temp:  36.6 C  SpO2: 100% 95%    Last Pain:  Vitals:   02/06/21 1157  TempSrc:   PainSc: 0-No pain                 Trevor Iha

## 2021-02-06 NOTE — Anesthesia Procedure Notes (Addendum)
°  Anesthesia Regional Block: Interscalene brachial plexus block   Pre-Anesthetic Checklist: , timeout performed,  Correct Patient, Correct Site, Correct Laterality,  Correct Procedure, Correct Position, site marked,  Risks and benefits discussed,  Surgical consent,  Pre-op evaluation,  At surgeon's request and post-op pain management  Laterality: Upper and Right  Prep: Maximum Sterile Barrier Precautions used, chloraprep       Needles:  Injection technique: Single-shot  Needle Type: Echogenic Needle     Needle Length: 5cm  Needle Gauge: 21     Additional Needles:   Procedures:,,,, ultrasound used (permanent image in chart),,    Narrative:  Start time: 02/06/2021 7:05 AM End time: 02/06/2021 7:10 AM Injection made incrementally with aspirations every 5 mL.  Performed by: Personally  Anesthesiologist: Barnet Glasgow, MD  Additional Notes: Block assessed prior to procedure. Patient tolerated procedure well.

## 2021-02-06 NOTE — Anesthesia Procedure Notes (Signed)
Procedure Name: Intubation Date/Time: 02/06/2021 7:51 AM Performed by: Deliah Boston, CRNA Pre-anesthesia Checklist: Patient identified, Emergency Drugs available, Suction available and Patient being monitored Patient Re-evaluated:Patient Re-evaluated prior to induction Oxygen Delivery Method: Circle system utilized Preoxygenation: Pre-oxygenation with 100% oxygen Induction Type: IV induction Ventilation: Mask ventilation without difficulty Grade View: Grade I Tube type: Oral Tube size: 7.0 mm Number of attempts: 1 Airway Equipment and Method: Stylet and Oral airway Placement Confirmation: ETT inserted through vocal cords under direct vision, positive ETCO2 and breath sounds checked- equal and bilateral Secured at: 21 cm Tube secured with: Tape Dental Injury: Teeth and Oropharynx as per pre-operative assessment

## 2021-02-06 NOTE — Transfer of Care (Signed)
Immediate Anesthesia Transfer of Care Note  Patient: Dawn Perry  Procedure(s) Performed: Procedure(s) with comments: Right Reverse shoulder arthroplasty; hardware removal (Right) - , screw removed  Patient Location: PACU  Anesthesia Type:General and Regional  Level of Consciousness: Patient easily awoken, sedated, comfortable, cooperative, following commands, responds to stimulation.   Airway & Oxygen Therapy: Patient spontaneously breathing, ventilating well, oxygen via simple oxygen mask.  Post-op Assessment: Report given to PACU RN, vital signs reviewed and stable, moving all extremities.   Post vital signs: Reviewed and stable.  Complications: No apparent anesthesia complications Last Vitals:  Vitals Value Taken Time  BP 136/65 02/06/21 0945  Temp    Pulse 70 02/06/21 0945  Resp 19 02/06/21 0945  SpO2 99 % 02/06/21 0945  Vitals shown include unvalidated device data.  Last Pain:  Vitals:   02/06/21 0557  TempSrc: Oral         Complications: No notable events documented.

## 2021-02-06 NOTE — Discharge Instructions (Signed)
? ?Dawn Perry, M.D., F.A.A.O.S. ?Orthopaedic Surgery ?Specializing in Arthroscopic and Reconstructive ?Surgery of the Shoulder ?[phone] ?[address] - [place], [place] [address] - Fax [phone] ? ? ?POST-OP TOTAL SHOULDER REPLACEMENT INSTRUCTIONS ? ?1. Follow up in the office for your first post-op appointment 10-14 days from the date of your surgery. If you do not already have a scheduled appointment, our office will contact you to schedule. ? ?2. The bandage over your incision is waterproof. You may begin showering with this dressing on. You may leave this dressing on until first follow up appointment within 2 weeks. We prefer you leave this dressing in place until follow up however after 5-7 days if you are having itching or skin irritation and would like to remove it you may do so. Go slow and tug at the borders gently to break the bond the dressing has with the skin. At this point if there is no drainage it is okay to go without a bandage or you may cover it with a light guaze and tape. You can also expect significant bruising around your shoulder that will drift down your arm and into your chest wall. This is very normal and should resolve over several days. ? ? 3. Wear your sling/immobilizer at all times except to perform the exercises below or to occasionally let your arm dangle by your side to stretch your elbow. You also need to sleep in your sling immobilizer until instructed otherwise. It is ok to remove your sling if you are sitting in a controlled environment and allow your arm to rest in a position of comfort by your side or on your lap with pillows to give your neck and skin a break from the sling. You may remove it to allow arm to dangle by side to shower. If you are up walking around and when you go to sleep at night you need to wear it. ? ?4. Range of motion to your elbow, wrist, and hand are encouraged 3-5 times daily. Exercise to your hand and fingers helps to reduce  swelling you may experience. ? ? ?5. Prescriptions for a pain medication and a muscle relaxant are provided for you. It is recommended that if you are experiencing pain that you pain medication alone is not controlling, add the muscle relaxant along with the pain medication which can give additional pain relief. The first 1-2 days is generally the most severe of your pain and then should gradually decrease. As your pain lessens it is recommended that you decrease your use of the pain medications to an "as needed basis'" only and to always comply with the recommended dosages of the pain medications. ? ?6. Pain medications can produce constipation along with their use. If you experience this, the use of an over the counter stool softener or laxative daily is recommended.  ? ?7. For additional questions or concerns, please do not hesitate to call the office. If after hours there is an answering service to forward your concerns to the physician on call. ? ?8.Pain control following an exparel block ? ?To help control your post-operative pain you received a nerve block  performed with Exparel which is a long acting anesthetic (numbing agent) which can provide pain relief and sensations of numbness (and relief of pain) in the operative shoulder and arm for up to 3 days. Sometimes it provides mixed relief, meaning you may still have numbness in certain areas of the arm but can still be able to   move  parts of that arm, hand, and fingers. We recommend that your prescribed pain medications  be used as needed. We do not feel it is necessary to "pre medicate" and "stay ahead" of pain.  Taking narcotic pain medications when you are not having any pain can lead to unnecessary and potentially dangerous side effects.   ? ?9. Use the ice machine as much as possible in the first 5-7 days from surgery, then you can wean its use to as needed. The ice typically needs to be replaced every 6 hours, instead of ice you can actually freeze  water bottles to put in the cooler and then fill water around them to avoid having to purchase ice. You can have spare water bottles freezing to allow you to rotate them once they have melted. Try to have a thin shirt or light cloth or towel under the ice wrap to protect your skin.  ? ?FOR ADDITIONAL INFO ON ICE MACHINE AND INSTRUCTIONS GO TO THE WEBSITE AT ? ?https://www.djogl[employer].com/products/donjoy/donjoy-iceman-classic3 ? ?10.  We recommend that you avoid any dental work or cleaning in the first 3 months following your joint replacement. This is to help minimize the possibility of infection from the bacteria in your mouth that enters your bloodstream during dental work. We also recommend that you take an antibiotic prior to your dental work for the first year after your shoulder replacement to further help reduce that risk. Please simply contact our office for antibiotics to be sent to your pharmacy prior to dental work. ? ?11. Dental Antibiotics: ? ?We recommend waiting at least 3 months for any dental work even cleanings unless there is a dental emergency. We also recommend  prophylactic antibiotics for all dental procdeures  the first year following your joint replacement. In some exceptions we recommend them to be used lifelong. We will provide you with that prescription in follow up office visits, or you can call our office. ? ?Exceptions are as follows: ? ?1. History of prior total joint infection ? ?2. Severely immunocompromised (Organ Transplant, cancer chemotherapy, Rheumatoid biologic ?meds such as Humera) ? ?3. Poorly controlled diabetes (A1C &gt; 8.0, blood glucose over 200) ? ? ?POST-OP EXERCISES ? ?Pendulum Exercises ? ?Perform pendulum exercises while standing and bending at the waist. Support your uninvolved arm on a table or chair and allow your operated arm to hang freely. Make sure to do these exercises passively - not using you shoulder muscles. These exercises can be performed once your  nerve block effects have worn off. ? ?Repeat 20 times. Do 3 sessions per day. ? ? ?  ?

## 2021-02-10 ENCOUNTER — Encounter (HOSPITAL_COMMUNITY): Payer: Self-pay | Admitting: Orthopedic Surgery

## 2021-02-17 DIAGNOSIS — Z96619 Presence of unspecified artificial shoulder joint: Secondary | ICD-10-CM | POA: Diagnosis not present

## 2021-02-17 DIAGNOSIS — Z4789 Encounter for other orthopedic aftercare: Secondary | ICD-10-CM | POA: Diagnosis not present

## 2021-02-17 DIAGNOSIS — Z471 Aftercare following joint replacement surgery: Secondary | ICD-10-CM | POA: Diagnosis not present

## 2021-03-03 DIAGNOSIS — M6281 Muscle weakness (generalized): Secondary | ICD-10-CM | POA: Diagnosis not present

## 2021-03-03 DIAGNOSIS — M25511 Pain in right shoulder: Secondary | ICD-10-CM | POA: Diagnosis not present

## 2021-03-03 DIAGNOSIS — M25611 Stiffness of right shoulder, not elsewhere classified: Secondary | ICD-10-CM | POA: Diagnosis not present

## 2021-03-06 DIAGNOSIS — M25511 Pain in right shoulder: Secondary | ICD-10-CM | POA: Diagnosis not present

## 2021-03-06 DIAGNOSIS — M25611 Stiffness of right shoulder, not elsewhere classified: Secondary | ICD-10-CM | POA: Diagnosis not present

## 2021-03-06 DIAGNOSIS — M6281 Muscle weakness (generalized): Secondary | ICD-10-CM | POA: Diagnosis not present

## 2021-03-11 DIAGNOSIS — M6281 Muscle weakness (generalized): Secondary | ICD-10-CM | POA: Diagnosis not present

## 2021-03-11 DIAGNOSIS — M25611 Stiffness of right shoulder, not elsewhere classified: Secondary | ICD-10-CM | POA: Diagnosis not present

## 2021-03-11 DIAGNOSIS — M25511 Pain in right shoulder: Secondary | ICD-10-CM | POA: Diagnosis not present

## 2021-03-13 DIAGNOSIS — M25611 Stiffness of right shoulder, not elsewhere classified: Secondary | ICD-10-CM | POA: Diagnosis not present

## 2021-03-13 DIAGNOSIS — M25511 Pain in right shoulder: Secondary | ICD-10-CM | POA: Diagnosis not present

## 2021-03-13 DIAGNOSIS — M6281 Muscle weakness (generalized): Secondary | ICD-10-CM | POA: Diagnosis not present

## 2021-03-17 DIAGNOSIS — M6281 Muscle weakness (generalized): Secondary | ICD-10-CM | POA: Diagnosis not present

## 2021-03-17 DIAGNOSIS — M25511 Pain in right shoulder: Secondary | ICD-10-CM | POA: Diagnosis not present

## 2021-03-17 DIAGNOSIS — M25611 Stiffness of right shoulder, not elsewhere classified: Secondary | ICD-10-CM | POA: Diagnosis not present

## 2021-03-19 DIAGNOSIS — M25511 Pain in right shoulder: Secondary | ICD-10-CM | POA: Diagnosis not present

## 2021-03-19 DIAGNOSIS — M6281 Muscle weakness (generalized): Secondary | ICD-10-CM | POA: Diagnosis not present

## 2021-03-19 DIAGNOSIS — M25611 Stiffness of right shoulder, not elsewhere classified: Secondary | ICD-10-CM | POA: Diagnosis not present

## 2021-03-25 DIAGNOSIS — M25511 Pain in right shoulder: Secondary | ICD-10-CM | POA: Diagnosis not present

## 2021-03-25 DIAGNOSIS — M6281 Muscle weakness (generalized): Secondary | ICD-10-CM | POA: Diagnosis not present

## 2021-03-25 DIAGNOSIS — M25611 Stiffness of right shoulder, not elsewhere classified: Secondary | ICD-10-CM | POA: Diagnosis not present

## 2021-03-27 DIAGNOSIS — E7800 Pure hypercholesterolemia, unspecified: Secondary | ICD-10-CM | POA: Diagnosis not present

## 2021-03-28 DIAGNOSIS — M6281 Muscle weakness (generalized): Secondary | ICD-10-CM | POA: Diagnosis not present

## 2021-03-28 DIAGNOSIS — M25511 Pain in right shoulder: Secondary | ICD-10-CM | POA: Diagnosis not present

## 2021-03-28 DIAGNOSIS — M25611 Stiffness of right shoulder, not elsewhere classified: Secondary | ICD-10-CM | POA: Diagnosis not present

## 2021-03-28 LAB — COMPREHENSIVE METABOLIC PANEL
ALT: 21 IU/L (ref 0–32)
AST: 20 IU/L (ref 0–40)
Albumin/Globulin Ratio: 2.2 (ref 1.2–2.2)
Albumin: 4.8 g/dL — ABNORMAL HIGH (ref 3.7–4.7)
Alkaline Phosphatase: 100 IU/L (ref 44–121)
BUN/Creatinine Ratio: 26 (ref 12–28)
BUN: 24 mg/dL (ref 8–27)
Bilirubin Total: 0.5 mg/dL (ref 0.0–1.2)
CO2: 22 mmol/L (ref 20–29)
Calcium: 9.8 mg/dL (ref 8.7–10.3)
Chloride: 104 mmol/L (ref 96–106)
Creatinine, Ser: 0.92 mg/dL (ref 0.57–1.00)
Globulin, Total: 2.2 g/dL (ref 1.5–4.5)
Glucose: 96 mg/dL (ref 70–99)
Potassium: 5 mmol/L (ref 3.5–5.2)
Sodium: 139 mmol/L (ref 134–144)
Total Protein: 7 g/dL (ref 6.0–8.5)
eGFR: 67 mL/min/{1.73_m2} (ref >59)

## 2021-03-28 LAB — LIPID PANEL
Chol/HDL Ratio: 3 ratio (ref 0.0–4.4)
Cholesterol, Total: 155 mg/dL (ref 100–199)
HDL: 52 mg/dL (ref >39)
LDL Chol Calc (NIH): 79 mg/dL (ref 0–99)
Triglycerides: 135 mg/dL (ref 0–149)
VLDL Cholesterol Cal: 24 mg/dL (ref 5–40)

## 2021-03-31 ENCOUNTER — Ambulatory Visit (HOSPITAL_BASED_OUTPATIENT_CLINIC_OR_DEPARTMENT_OTHER): Payer: Medicare Other | Admitting: Cardiovascular Disease

## 2021-03-31 DIAGNOSIS — M25511 Pain in right shoulder: Secondary | ICD-10-CM | POA: Diagnosis not present

## 2021-03-31 DIAGNOSIS — M25611 Stiffness of right shoulder, not elsewhere classified: Secondary | ICD-10-CM | POA: Diagnosis not present

## 2021-03-31 DIAGNOSIS — M6281 Muscle weakness (generalized): Secondary | ICD-10-CM | POA: Diagnosis not present

## 2021-03-31 NOTE — Progress Notes (Incomplete)
Cardiology Office Note  Date:  03/31/2021   ID:  Dawn Perry, DOB 02-16-1950, MRN [id number]  PCP:  Vivi Barrack, MD  Cardiologist:   Madelin Rear   No chief complaint on file.    History of Present Illness: Dawn Perry is a 71 y.o. female with hyperlipidemia and LBBB who presents for follow up.  She was previosly a patient of Truitt Merle, NP.  She had a nuclear stress test in 2004 at the time when she was first diagnosed with left bundle branch block.  This was negative for ischemia.  She had an echo in 2012 that revealed LVEF 55% and mild mitral vegetation.  She has a history of vasovagal presyncope with blood draws.  She last saw Cecille Rubin on 09/2019 and was doing well.  She had a core Neri CT 11/2017 consistent with nonobstructive three-vessel CAD.  FFR was negative.  Her calcium score was 357 which was 92nd percentile.  At her last appointment, she reported muscle aches due to her statin. Since she started a multivitamin and COQ10, she was able to better tolerate her statin. She also planned on working on a plant-based diet and increasing her exercise. At home her blood pressure was in the 130s-low 140s. Her exercise was limited by her right hip. She underwent total hip arthroplasty 09/2020 which went well***. On 02/06/2021 she had right reverse shoulder arthroplasty/hardware removal.  Today,  She denies any palpitations, chest pain, shortness of breath, or peripheral edema. No lightheadedness, headaches, syncope, orthopnea, or PND.  (+)  Past Medical History:  Diagnosis Date   CAD in native artery 09/26/2020   Elevated blood pressure reading 09/26/2020   Family history of adverse reaction to anesthesia    mother had problems with N/V   GERD (gastroesophageal reflux disease)    B hands   H/O scarlet fever    age 48   Heart murmur    History of chicken pox    Hyperlipidemia    LBBB (left bundle branch block)    Osteoarthritis    PONV (postoperative nausea and  vomiting)     Past Surgical History:  Procedure Laterality Date   Broke Left forearm     fracture left arm surgery      REVERSE SHOULDER ARTHROPLASTY Right 02/06/2021   Procedure: Right Reverse shoulder arthroplasty; hardware removal;  Surgeon: Justice Britain, MD;  Location: [facility];  Service: Orthopedics;  Laterality: Right;  150min, screw removed   right shoulder surgery      Right shoulder tear  01/20/1988   TOTAL HIP ARTHROPLASTY Right 10/09/2020   Procedure: TOTAL HIP ARTHROPLASTY ANTERIOR APPROACH;  Surgeon: Gaynelle Arabian, MD;  Location: [facility];  Service: Orthopedics;  Laterality: Right;     Current Outpatient Medications  Medication Sig Dispense Refill   acetaminophen (TYLENOL) 500 MG tablet Take 1,000 mg by mouth every 6 (six) hours as needed for moderate pain.     aspirin EC 81 MG tablet Take 81 mg by mouth daily. Swallow whole.     Calcium Citrate-Vitamin D (CITRACAL + D PO) Take 1 tablet by mouth daily.     Coenzyme Q10 300 MG CAPS Take 300 mg by mouth daily.     HYDROcodone-acetaminophen (NORCO/VICODIN) 5-325 MG tablet Take 1-2 tablets by mouth every 6 (six) hours as needed for severe pain. (Patient not taking: Reported on 01/16/2021) 42 tablet 0   methocarbamol (ROBAXIN) 500 MG tablet Take 1 tablet (500 mg total) by mouth every 6 (six)  hours as needed for muscle spasms. 40 tablet 0   Multiple Vitamin (MULTI-VITAMIN DAILY) TABS Take 1 tablet by mouth daily.     naproxen (NAPROSYN) 500 MG tablet Take 1 tablet (500 mg total) by mouth 2 (two) times daily with a meal. 60 tablet 1   ondansetron (ZOFRAN) 4 MG tablet Take 1 tablet (4 mg total) by mouth every 8 (eight) hours as needed for nausea or vomiting. 10 tablet 0   rosuvastatin (CRESTOR) 20 MG tablet Take 1 tablet (20 mg total) by mouth daily. 90 tablet 3   traMADol (ULTRAM) 50 MG tablet Take 1-2 tablets (50-100 mg total) by mouth every 6 (six) hours as needed for moderate pain. (Patient not taking: Reported on 01/16/2021) 40  tablet 0   No current facility-administered medications for this visit.    Allergies:   Patient has no known allergies.    Social History:  The patient  reports that she has never smoked. She has never used smokeless tobacco. She reports that she does not drink alcohol and does not use drugs.   Family History:  The patient's family history includes Arthritis in her father and mother; Coronary artery disease in her brother; Hyperlipidemia in her mother; Other in her mother; Parkinson's disease in her father; Parkinsonism in an other family member; Stroke in her mother.    ROS:   Please see the history of present illness.  All other systems are reviewed and negative.    PHYSICAL EXAM:  VS:  There were no vitals taken for this visit. , BMI There is no height or weight on file to calculate BMI. GENERAL:  Well appearing HEENT:  Pupils equal round and reactive, fundi not visualized, oral mucosa unremarkable NECK:  No jugular venous distention, waveform within normal limits, carotid upstroke brisk and symmetric, no bruits, no thyromegaly LYMPHATICS:  No cervical adenopathy LUNGS:  Clear to auscultation bilaterally HEART:  RRR.  PMI not displaced or sustained,S1 and S2 within normal limits, no S3, no S4, no clicks, no rubs, no murmurs ABD:  Flat, positive bowel sounds normal in frequency in pitch, no bruits, no rebound, no guarding, no midline pulsatile mass, no hepatomegaly, no splenomegaly EXT:  2 plus pulses throughout, no edema, no cyanosis no clubbing SKIN:  No rashes no nodules NEURO:  Cranial nerves II through XII grossly intact, motor grossly intact throughout PSYCH:  Cognitively intact, oriented to person place and time   EKG:  EKG is personally reviewed. 03/31/2021: EKG was not ordered. 09/26/2020: sinus rhythm.  Rate 58 bpm   Cardiac CTA 11/10/2017: FINDINGS: Non-cardiac: See separate report from [facility]. No significant findings on limited lung and soft tissue  windows.   Calcium score: Calcium noted in all 3 major epicardial coronary vessels   Coronary Arteries: Right dominant with no anomalies   LM: Normal   LAD: 50% proximal calcific disease includes ostium of D1 less than 50% mid vessel disease   D1: Normal   D2: Normal   D3: Normal   Circumflex: Less than 50% calcific proximal and mid vessel disease   OM1: Normal   AV groove : Normal   RCA: Less than 50% proximal and mid vessel disease   PDA: Normal   PLA: Normal   IMPRESSION: 1. Likely non obstructive 3 vessel CAD. Proximal LAD seems worst will be sent for FFR CT   2.  Calcium score 357 which is 58 nd percentile for age and sex   17.  Normal aortic root 2.8  cm  Coronary Calcium Score 11/10/2017: FINDINGS: Non-cardiac: See separate report from [facility].   Ascending Aorta: Normal size, trivial calcifications.   Pericardium: Normal, mild focal calcifications.   Coronary arteries: Normal origin.   IMPRESSION: Coronary calcium score of 259. This was 40 percentile for age and sex matched control.  Recent Labs: 01/24/2021: Hemoglobin 14.1; Platelets 205 03/27/2021: ALT 21; BUN 24; Creatinine, Ser 0.92; Potassium 5.0; Sodium 139    Lipid Panel    Component Value Date/Time   CHOL 155 03/27/2021 0817   TRIG 135 03/27/2021 0817   HDL 52 03/27/2021 0817   CHOLHDL 3.0 03/27/2021 0817   CHOLHDL 5.0 10/18/2015 0924   VLDL 31 (H) 10/18/2015 0924   LDLCALC 79 03/27/2021 0817   LDLDIRECT 132.0 02/15/2013 1006      Wt Readings from Last 3 Encounters:  01/24/21 130 lb 6.4 oz (59.1 kg)  10/09/20 139 lb (63 kg)  10/01/20 139 lb (63 kg)      ASSESSMENT AND PLAN:  No problem-specific Assessment & Plan notes found for this encounter.   # Pre-surgical risk assessment:  The patient does not have any unstable cardiac conditions.  Upon evaluation today, she can achieve 4 METs or greater without anginal symptoms.  According to [facility] and AHA guidelines, she  requires no further cardiac workup prior to her noncardiac surgery and should be at acceptable risk.  her NSQIP risk of peri-procedural MI or cardiac arrest is <1%.  Our service is available as necessary in the perioperative period.  Okay to hold aspirin perioperatively.  # Non-obstructive CAD:  # Hyperlipidemia: Nonobstructive CAD on cardiac CT in 2019.  She has no anginal symptoms.  We discussed the fact that her lipids are not at goal.  She wants to work on going back to a plant-based diet and increasing her exercise after her surgery.  We will recheck her fasting lipids in 6 months.  If her LDL not less than 70 we will need to increase her statin.  Continue aspirin.  # Essential hypertension: Blood pressure is elevated today and has been elevated in the past.  Her goal is less than 130/80 given her CAD.  She is very mildly elevated and will work on diet and exercise prior to starting any antihypertensives.  Given her CAD would consider starting a beta-blocker if she needs a medicine in the future.   Current medicines are reviewed at length with the patient today.  The patient does not have concerns regarding medicines.  The following changes have been made:  no change  Labs/ tests ordered today include:   No orders of the defined types were placed in this encounter.   Disposition:   FU with Tiffany C. Oval Linsey, MD, [facility] in ***6 months.    I,Mathew Stumpf,acting as a Education administrator for Skeet Latch, MD.,have documented all relevant documentation on the behalf of Skeet Latch, MD,as directed by  Skeet Latch, MD while in the presence of Skeet Latch, MD.  ***  Signed, Tiffany C. Oval Linsey, MD, [facility]  03/31/2021 8:35 AM    [place]

## 2021-04-07 DIAGNOSIS — M25611 Stiffness of right shoulder, not elsewhere classified: Secondary | ICD-10-CM | POA: Diagnosis not present

## 2021-04-07 DIAGNOSIS — M6281 Muscle weakness (generalized): Secondary | ICD-10-CM | POA: Diagnosis not present

## 2021-04-07 DIAGNOSIS — M25511 Pain in right shoulder: Secondary | ICD-10-CM | POA: Diagnosis not present

## 2021-04-10 DIAGNOSIS — M6281 Muscle weakness (generalized): Secondary | ICD-10-CM | POA: Diagnosis not present

## 2021-04-10 DIAGNOSIS — M25511 Pain in right shoulder: Secondary | ICD-10-CM | POA: Diagnosis not present

## 2021-04-10 DIAGNOSIS — M25611 Stiffness of right shoulder, not elsewhere classified: Secondary | ICD-10-CM | POA: Diagnosis not present

## 2021-04-14 DIAGNOSIS — M25611 Stiffness of right shoulder, not elsewhere classified: Secondary | ICD-10-CM | POA: Diagnosis not present

## 2021-04-14 DIAGNOSIS — M6281 Muscle weakness (generalized): Secondary | ICD-10-CM | POA: Diagnosis not present

## 2021-04-14 DIAGNOSIS — M25511 Pain in right shoulder: Secondary | ICD-10-CM | POA: Diagnosis not present

## 2021-04-17 DIAGNOSIS — M25511 Pain in right shoulder: Secondary | ICD-10-CM | POA: Diagnosis not present

## 2021-04-17 DIAGNOSIS — M6281 Muscle weakness (generalized): Secondary | ICD-10-CM | POA: Diagnosis not present

## 2021-04-17 DIAGNOSIS — M25611 Stiffness of right shoulder, not elsewhere classified: Secondary | ICD-10-CM | POA: Diagnosis not present

## 2021-04-21 DIAGNOSIS — Z4789 Encounter for other orthopedic aftercare: Secondary | ICD-10-CM | POA: Diagnosis not present

## 2021-04-21 DIAGNOSIS — Z96611 Presence of right artificial shoulder joint: Secondary | ICD-10-CM | POA: Diagnosis not present

## 2021-04-21 DIAGNOSIS — Z471 Aftercare following joint replacement surgery: Secondary | ICD-10-CM | POA: Diagnosis not present

## 2021-05-07 ENCOUNTER — Telehealth: Payer: Self-pay | Admitting: Family Medicine

## 2021-05-07 NOTE — Telephone Encounter (Signed)
error 

## 2021-05-14 DIAGNOSIS — M6281 Muscle weakness (generalized): Secondary | ICD-10-CM | POA: Diagnosis not present

## 2021-05-14 DIAGNOSIS — M25611 Stiffness of right shoulder, not elsewhere classified: Secondary | ICD-10-CM | POA: Diagnosis not present

## 2021-05-14 DIAGNOSIS — M25511 Pain in right shoulder: Secondary | ICD-10-CM | POA: Diagnosis not present

## 2021-05-16 DIAGNOSIS — M6281 Muscle weakness (generalized): Secondary | ICD-10-CM | POA: Diagnosis not present

## 2021-05-16 DIAGNOSIS — M25511 Pain in right shoulder: Secondary | ICD-10-CM | POA: Diagnosis not present

## 2021-05-16 DIAGNOSIS — M25611 Stiffness of right shoulder, not elsewhere classified: Secondary | ICD-10-CM | POA: Diagnosis not present

## 2021-05-20 DIAGNOSIS — M25611 Stiffness of right shoulder, not elsewhere classified: Secondary | ICD-10-CM | POA: Diagnosis not present

## 2021-05-20 DIAGNOSIS — M25511 Pain in right shoulder: Secondary | ICD-10-CM | POA: Diagnosis not present

## 2021-05-20 DIAGNOSIS — M6281 Muscle weakness (generalized): Secondary | ICD-10-CM | POA: Diagnosis not present

## 2021-05-22 DIAGNOSIS — M25611 Stiffness of right shoulder, not elsewhere classified: Secondary | ICD-10-CM | POA: Diagnosis not present

## 2021-05-22 DIAGNOSIS — M25511 Pain in right shoulder: Secondary | ICD-10-CM | POA: Diagnosis not present

## 2021-05-22 DIAGNOSIS — M6281 Muscle weakness (generalized): Secondary | ICD-10-CM | POA: Diagnosis not present

## 2021-05-26 DIAGNOSIS — M6281 Muscle weakness (generalized): Secondary | ICD-10-CM | POA: Diagnosis not present

## 2021-05-26 DIAGNOSIS — M25511 Pain in right shoulder: Secondary | ICD-10-CM | POA: Diagnosis not present

## 2021-05-26 DIAGNOSIS — M25611 Stiffness of right shoulder, not elsewhere classified: Secondary | ICD-10-CM | POA: Diagnosis not present

## 2021-05-28 DIAGNOSIS — M25511 Pain in right shoulder: Secondary | ICD-10-CM | POA: Diagnosis not present

## 2021-05-28 DIAGNOSIS — M6281 Muscle weakness (generalized): Secondary | ICD-10-CM | POA: Diagnosis not present

## 2021-05-28 DIAGNOSIS — M25611 Stiffness of right shoulder, not elsewhere classified: Secondary | ICD-10-CM | POA: Diagnosis not present

## 2021-06-03 DIAGNOSIS — M25611 Stiffness of right shoulder, not elsewhere classified: Secondary | ICD-10-CM | POA: Diagnosis not present

## 2021-06-03 DIAGNOSIS — M25511 Pain in right shoulder: Secondary | ICD-10-CM | POA: Diagnosis not present

## 2021-06-03 DIAGNOSIS — M6281 Muscle weakness (generalized): Secondary | ICD-10-CM | POA: Diagnosis not present

## 2021-06-05 DIAGNOSIS — M25511 Pain in right shoulder: Secondary | ICD-10-CM | POA: Diagnosis not present

## 2021-06-05 DIAGNOSIS — M25611 Stiffness of right shoulder, not elsewhere classified: Secondary | ICD-10-CM | POA: Diagnosis not present

## 2021-06-05 DIAGNOSIS — M6281 Muscle weakness (generalized): Secondary | ICD-10-CM | POA: Diagnosis not present

## 2021-06-10 DIAGNOSIS — M6281 Muscle weakness (generalized): Secondary | ICD-10-CM | POA: Diagnosis not present

## 2021-06-10 DIAGNOSIS — M25611 Stiffness of right shoulder, not elsewhere classified: Secondary | ICD-10-CM | POA: Diagnosis not present

## 2021-06-10 DIAGNOSIS — M25511 Pain in right shoulder: Secondary | ICD-10-CM | POA: Diagnosis not present

## 2021-06-12 DIAGNOSIS — M25611 Stiffness of right shoulder, not elsewhere classified: Secondary | ICD-10-CM | POA: Diagnosis not present

## 2021-06-12 DIAGNOSIS — M25511 Pain in right shoulder: Secondary | ICD-10-CM | POA: Diagnosis not present

## 2021-06-12 DIAGNOSIS — M6281 Muscle weakness (generalized): Secondary | ICD-10-CM | POA: Diagnosis not present

## 2021-06-19 DIAGNOSIS — M6281 Muscle weakness (generalized): Secondary | ICD-10-CM | POA: Diagnosis not present

## 2021-06-19 DIAGNOSIS — M25511 Pain in right shoulder: Secondary | ICD-10-CM | POA: Diagnosis not present

## 2021-06-19 DIAGNOSIS — M25611 Stiffness of right shoulder, not elsewhere classified: Secondary | ICD-10-CM | POA: Diagnosis not present

## 2021-06-26 DIAGNOSIS — M6281 Muscle weakness (generalized): Secondary | ICD-10-CM | POA: Diagnosis not present

## 2021-06-26 DIAGNOSIS — M25611 Stiffness of right shoulder, not elsewhere classified: Secondary | ICD-10-CM | POA: Diagnosis not present

## 2021-06-26 DIAGNOSIS — M25511 Pain in right shoulder: Secondary | ICD-10-CM | POA: Diagnosis not present

## 2021-07-02 DIAGNOSIS — H25813 Combined forms of age-related cataract, bilateral: Secondary | ICD-10-CM | POA: Diagnosis not present

## 2021-07-02 DIAGNOSIS — H524 Presbyopia: Secondary | ICD-10-CM | POA: Diagnosis not present

## 2021-07-03 DIAGNOSIS — M6281 Muscle weakness (generalized): Secondary | ICD-10-CM | POA: Diagnosis not present

## 2021-07-03 DIAGNOSIS — M25511 Pain in right shoulder: Secondary | ICD-10-CM | POA: Diagnosis not present

## 2021-07-03 DIAGNOSIS — M25611 Stiffness of right shoulder, not elsewhere classified: Secondary | ICD-10-CM | POA: Diagnosis not present

## 2021-07-17 DIAGNOSIS — M25511 Pain in right shoulder: Secondary | ICD-10-CM | POA: Diagnosis not present

## 2021-07-17 DIAGNOSIS — M6281 Muscle weakness (generalized): Secondary | ICD-10-CM | POA: Diagnosis not present

## 2021-07-17 DIAGNOSIS — M25611 Stiffness of right shoulder, not elsewhere classified: Secondary | ICD-10-CM | POA: Diagnosis not present

## 2021-07-28 NOTE — Progress Notes (Signed)
Cardiology Office Note  Date:  07/29/2021   ID:  Dawn Perry, DOB 1950-05-12, MRN [id number]  PCP:  Ardith Dark, MD  Cardiologist:   Chilton Si, MD   No chief complaint on file.   History of Present Illness: Dawn Perry is a 71 y.o. female with nonobstructive CAD, hyperlipidemia, and LBBB who presents for follow up.  She was previosly a patient of Dawn Fredrickson, NP.  She had a nuclear stress test in 2004 at the time when she was first diagnosed with left bundle branch block.  This was negative for ischemia.  She had an echo in 2012 that revealed LVEF 55% and mild mitral vegetation.  She has a history of vasovagal presyncope with blood draws.  She last saw Lawson Fiscal on 09/2019 and was doing well.  She had a coronary CT 11/2017 consistent with nonobstructive three-vessel CAD.  FFR was negative.  Her calcium score was 357 which was 92nd percentile.  At her last appointment she was doing well but blood pressure was mildly elevated. She wanted to work on diet and exercise. She had surgery on her right shoulder and her right hip replaced 09/2020. Today, she reports feeling well with no major complaints. At home her blood pressure has been similar to today's in clinic reading of 128/82. For exercise she has been walking frequently, with no anginal symptoms. However, every now and then she experiences a tingling sensation along her jaw line of both sides of her face and neck. This is difficult for her to describe. Usually this will occur when she is walking too quickly uphill, but not every time. When she stops for a moment her symptoms will resolve spontaneously. Regarding her diet she does not add any salt to her meals. She states she has lapsed with eating some healthy foods such as beans and fish and has gained some weight. She continues to avoid all red meats. Typically she drinks 1 cup of coffee a day. She denies any palpitations, chest pain, shortness of breath, or peripheral edema. No  lightheadedness, headaches, syncope, orthopnea, or PND.   Past Medical History:  Diagnosis Date   CAD in native artery 09/26/2020   Elevated blood pressure reading 09/26/2020   Family history of adverse reaction to anesthesia    mother had problems with N/V   GERD (gastroesophageal reflux disease)    B hands   H/O scarlet fever    age 13   Heart murmur    History of chicken pox    Hyperlipidemia    LBBB (left bundle branch block)    Osteoarthritis    PONV (postoperative nausea and vomiting)     Past Surgical History:  Procedure Laterality Date   Broke Left forearm     fracture left arm surgery      REVERSE SHOULDER ARTHROPLASTY Right 02/06/2021   Procedure: Right Reverse shoulder arthroplasty; hardware removal;  Surgeon: Francena Hanly, MD;  Location: [facility];  Service: Orthopedics;  Laterality: Right;  , screw removed   right shoulder surgery      Right shoulder tear  01/20/1988   TOTAL HIP ARTHROPLASTY Right 10/09/2020   Procedure: TOTAL HIP ARTHROPLASTY ANTERIOR APPROACH;  Surgeon: Ollen Gross, MD;  Location: [facility];  Service: Orthopedics;  Laterality: Right;     Current Outpatient Medications  Medication Sig Dispense Refill   aspirin EC 81 MG tablet Take 81 mg by mouth daily. Swallow whole.     Calcium Citrate-Vitamin D (CITRACAL + D PO)  Take 1 tablet by mouth daily.     Coenzyme Q10 300 MG CAPS Take 300 mg by mouth daily.     Multiple Vitamin (MULTI-VITAMIN DAILY) TABS Take 1 tablet by mouth daily.     rosuvastatin (CRESTOR) 20 MG tablet Take 1 tablet (20 mg total) by mouth daily. 90 tablet 3   No current facility-administered medications for this visit.    Allergies:   Patient has no known allergies.    Social History:  The patient  reports that she has never smoked. She has never used smokeless tobacco. She reports that she does not drink alcohol and does not use drugs.   Family History:  The patient's family history includes Arthritis in her father and  mother; Coronary artery disease in her brother; Hyperlipidemia in her mother; Other in her mother; Parkinson's disease in her father; Parkinsonism in an other family member; Stroke in her mother.    ROS:   Please see the history of present illness.    (+) Tingling sensation of jawline/neck   All other systems are reviewed and negative.    PHYSICAL EXAM:  VS:  BP 128/82 (BP Location: Left Arm, Patient Position: Sitting, Cuff Size: Normal)   Pulse 61   Ht 5\' 3"  (1.6 m)   Wt 137 lb 4.8 oz (62.3 kg)   BMI 24.32 kg/m  , BMI Body mass index is 24.32 kg/m. GENERAL:  Well appearing HEENT:  Pupils equal round and reactive, fundi not visualized, oral mucosa unremarkable NECK:  No jugular venous distention, waveform within normal limits, carotid upstroke brisk and symmetric, no bruits, no thyromegaly LYMPHATICS:  No cervical adenopathy LUNGS:  Clear to auscultation bilaterally HEART:  RRR.  PMI not displaced or sustained,S1 and S2 within normal limits, no S3, no S4, no clicks, no rubs, no murmurs ABD:  Flat, positive bowel sounds normal in frequency in pitch, no bruits, no rebound, no guarding, no midline pulsatile mass, no hepatomegaly, no splenomegaly EXT:  2 plus pulses throughout, no edema, no cyanosis no clubbing SKIN:  No rashes no nodules NEURO:  Cranial nerves II through XII grossly intact, motor grossly intact throughout PSYCH:  Cognitively intact, oriented to person place and time   EKG:  EKG is personally reviewed. 07/29/2021:  Sinus rhythm. Rate 61 bpm. LBBB. LAD. 09/26/2020: sinus rhythm.  Rate 58 bpm   11/26/2020 Vascular Screening Evaluation  10/16/2019: Summary:  Right Carotid: Mild: plaque visualized in the right internal carotid  artery.  Left Carotid: Mild: plaque visualized in the left internal carotid artery.     Right ABI: The right ankle brachial index is within normal limits,  suggesting the absence of significant arterial obstruction at rest.  Left ABI: The left ankle  brachial index is within normal limits,  suggesting the absence of significant arterial obstruction at rest.     Aorta: There is no evidence of an abdominal aortic aneurysm.   CTA Coronary  11/29/2017: FINDINGS: Non-cardiac: See separate report from [facility]. No significant findings on limited lung and soft tissue windows.   Calcium score: Calcium noted in all 3 major epicardial coronary vessels   Coronary Arteries: Right dominant with no anomalies   LM: Normal   LAD: 50% proximal calcific disease includes ostium of D1 less than 50% mid vessel disease   D1: Normal   D2: Normal   D3: Normal   Circumflex: Less than 50% calcific proximal and mid vessel disease   OM1: Normal   AV groove : Normal  RCA: Less than 50% proximal and mid vessel disease   PDA: Normal   PLA: Normal   IMPRESSION: 1. Likely non obstructive 3 vessel CAD. Proximal LAD seems worst will be sent for FFR CT   2.  Calcium score 357 which is 92 nd percentile for age and sex   3.  Normal aortic root 2.8 cm  Coronary Calcium Score  11/10/2017: FINDINGS: Non-cardiac: See separate report from [facility].   Ascending Aorta: Normal size, trivial calcifications.   Pericardium: Normal, mild focal calcifications.   Coronary arteries: Normal origin.   IMPRESSION: Coronary calcium score of 259. This was 69 percentile for age and sex matched control.   Recent Labs: 01/24/2021: Hemoglobin 14.1; Platelets 205 03/27/2021: ALT 21; BUN 24; Creatinine, Ser 0.92; Potassium 5.0; Sodium 139    Lipid Panel    Component Value Date/Time   CHOL 155 03/27/2021 0817   TRIG 135 03/27/2021 0817   HDL 52 03/27/2021 0817   CHOLHDL 3.0 03/27/2021 0817   CHOLHDL 5.0 10/18/2015 0924   VLDL 31 (H) 10/18/2015 0924   LDLCALC 79 03/27/2021 0817   LDLDIRECT 132.0 02/15/2013 1006    Wt Readings from Last 3 Encounters:  07/29/21 137 lb 4.8 oz (62.3 kg)  01/24/21 130 lb 6.4 oz (59.1 kg)  10/09/20  139 lb (63 kg)     ASSESSMENT AND PLAN:  CAD in native artery Three vessel CAD   Hyperlipidemia Lipids are fairly well controlled.  When checked in February she was nearly at goal.  We discussed the fact that her LDL needs to be less than 70 given that she has some CAD and given that we have some concerns about possible angina.  She wants to keep working on diet and exercise.  We will repeat lipids and a CMP in 6 months and reassess.  Elevated blood pressure reading Keep working on diet, exercise, and limiting sodium intake.  Blood pressure goal is less than 130/80.   Current medicines are reviewed at length with the patient today.  The patient does not have concerns regarding medicines.  The following changes have been made:  no change  Labs/ tests ordered today include:   Orders Placed This Encounter  Procedures   Lipid panel   Comprehensive metabolic panel   EKG 12-Lead    Disposition:   FU with Jarone Ostergaard C. Duke Salvia, MD, [facility] in 6 months.    I,Mathew Stumpf,acting as a Neurosurgeon for Chilton Si, MD.,have documented all relevant documentation on the behalf of Chilton Si, MD,as directed by  Chilton Si, MD while in the presence of Chilton Si, MD.  I, Shavy Beachem C. Duke Salvia, MD have reviewed all documentation for this visit.  The documentation of the exam, diagnosis, procedures, and orders on 07/29/2021 are all accurate and complete.   Signed, Abishai Viegas C. Duke Salvia, MD, [facility]  07/29/2021 9:27 AM    [facility]

## 2021-07-29 ENCOUNTER — Ambulatory Visit (INDEPENDENT_AMBULATORY_CARE_PROVIDER_SITE_OTHER): Payer: Medicare Other | Admitting: Cardiovascular Disease

## 2021-07-29 ENCOUNTER — Encounter (HOSPITAL_BASED_OUTPATIENT_CLINIC_OR_DEPARTMENT_OTHER): Payer: Self-pay | Admitting: Cardiovascular Disease

## 2021-07-29 VITALS — BP 128/82 | HR 61 | Ht 63.0 in | Wt 137.3 lb

## 2021-07-29 DIAGNOSIS — R030 Elevated blood-pressure reading, without diagnosis of hypertension: Secondary | ICD-10-CM

## 2021-07-29 DIAGNOSIS — Z5181 Encounter for therapeutic drug level monitoring: Secondary | ICD-10-CM

## 2021-07-29 DIAGNOSIS — I2510 Atherosclerotic heart disease of native coronary artery without angina pectoris: Secondary | ICD-10-CM | POA: Diagnosis not present

## 2021-07-29 DIAGNOSIS — E7800 Pure hypercholesterolemia, unspecified: Secondary | ICD-10-CM | POA: Diagnosis not present

## 2021-07-29 NOTE — Patient Instructions (Signed)
Medication Instructions:  Your physician recommends that you continue on your current medications as directed. Please refer to the Current Medication list given to you today.   *If you need a refill on your cardiac medications before your next appointment, please call your pharmacy*  Lab Work: FASTING LP/CMET 6 MONTHS ABOUT 1 WEEK PRIOR TO FOLLOW UP   Testing/Procedures: NONE  Follow-Up: At [facility], you and your health needs are our priority.  As part of our continuing mission to provide you with exceptional heart care, we have created designated Provider Care Teams.  These Care Teams include your primary Cardiologist (physician) and Advanced Practice Providers (APPs -  Physician Assistants and Nurse Practitioners) who all work together to provide you with the care you need, when you need it.  We recommend signing up for the patient portal called "MyChart".  Sign up information is provided on this After Visit Summary.  MyChart is used to connect with patients for Virtual Visits (Telemedicine).  Patients are able to view lab/test results, encounter notes, upcoming appointments, etc.  Non-urgent messages can be sent to your provider as well.   To learn more about what you can do with MyChart, go to [email or web address].    Your next appointment:   6 month(s)  The format for your next appointment:   In Person  Provider:   Chilton Si, MD or Gillian Shields, NP

## 2021-07-29 NOTE — Assessment & Plan Note (Signed)
Keep working on diet, exercise, and limiting sodium intake.  Blood pressure goal is less than 130/80.

## 2021-07-29 NOTE — Assessment & Plan Note (Signed)
Three vessel CAD

## 2021-07-29 NOTE — Assessment & Plan Note (Signed)
Lipids are fairly well controlled.  When checked in February she was nearly at goal.  We discussed the fact that her LDL needs to be less than 70 given that she has some CAD and given that we have some concerns about possible angina.  She wants to keep working on diet and exercise.  We will repeat lipids and a CMP in 6 months and reassess.

## 2021-08-13 DIAGNOSIS — Z96611 Presence of right artificial shoulder joint: Secondary | ICD-10-CM | POA: Diagnosis not present

## 2021-08-15 DIAGNOSIS — Z471 Aftercare following joint replacement surgery: Secondary | ICD-10-CM | POA: Diagnosis not present

## 2021-08-15 DIAGNOSIS — Z96641 Presence of right artificial hip joint: Secondary | ICD-10-CM | POA: Diagnosis not present

## 2021-08-15 DIAGNOSIS — Z96611 Presence of right artificial shoulder joint: Secondary | ICD-10-CM | POA: Diagnosis not present

## 2021-09-12 ENCOUNTER — Other Ambulatory Visit: Payer: Self-pay

## 2021-09-12 NOTE — Patient Instructions (Signed)
Visit Information  Thank you for taking time to visit with me today. Please don't hesitate to contact me if I can be of assistance to you.   Following are the goals we discussed today:   Goals Addressed             This Visit's Progress    COMPLETED: Care Coordination Activities - no follow up required       Care Coordination Interventions: Advised patient to call to schedule Annual Wellness Visit Provided education to patient re: Annual Wellness Visit, care coordination services Assessed social determinant of health barriers          If you are experiencing a Mental Health or Behavioral Health Crisis or need someone to talk to, please call the Suicide and Crisis Lifeline: 988 call the [place] National Suicide Prevention Lifeline: 1-800-273-8255 or TTY: 1-800-799-4 TTY (1-800-799-4889) to talk to a trained counselor call 1-800-273-TALK (toll free, 24 hour hotline) go to [facility] [address], [place] ([phone]) call 911   Patient verbalizes understanding of instructions and care plan provided today and agrees to view in MyChart. Active MyChart status and patient understanding of how to access instructions and care plan via MyChart confirmed with patient.     No further follow up required:    Deonta Bomberger RN, BSN,CCM, CDE Care Management Coordinator [facility] [phone]     

## 2021-09-12 NOTE — Patient Outreach (Signed)
  Care Coordination   Initial Visit Note   09/12/2021 Name: Dawn Perry MRN: [id number] DOB: [birth year]  Dawn Perry is a 71 y.o. year old female who sees Ardith Dark, MD for primary care. I spoke with  Jamse Mead by phone today.  What matters to the patients health and wellness today?  No concerns today    Goals Addressed             This Visit's Progress    COMPLETED: Care Coordination Activities - no follow up required       Care Coordination Interventions: Advised patient to call to schedule Annual Wellness Visit Provided education to patient re: Annual Wellness Visit, care coordination services Assessed social determinant of health barriers          SDOH assessments and interventions completed:  Yes  SDOH Interventions Today    Flowsheet Row Most Recent Value  SDOH Interventions   Food Insecurity Interventions Intervention Not Indicated  Financial Strain Interventions Intervention Not Indicated  Housing Interventions Intervention Not Indicated  Transportation Interventions Intervention Not Indicated        Care Coordination Interventions Activated:  Yes  Care Coordination Interventions:  Yes, provided   Follow up plan: No further intervention required.   Encounter Outcome:  Pt. Visit Completed  Dudley Major RN, BSN,CCM, CDE Care Management Coordinator [facility] [phone]

## 2021-09-12 NOTE — Patient Outreach (Signed)
  Care Coordination   09/12/2021 Name: Dawn Perry MRN: [id number] DOB: 1950/03/23   Care Coordination Outreach Attempts:  An unsuccessful telephone outreach was attempted today to offer the patient information about available care coordination services as a benefit of their health plan.   Follow Up Plan:  Additional outreach attempts will be made to offer the patient care coordination information and services.   Encounter Outcome:  No Answer  Care Coordination Interventions Activated:  No   Care Coordination Interventions:  No, not indicated    Dudley Major RN, BSN,CCM, CDE Care Management Coordinator [facility] [phone]

## 2021-09-16 DIAGNOSIS — U071 COVID-19: Secondary | ICD-10-CM | POA: Diagnosis not present

## 2021-10-13 ENCOUNTER — Encounter: Payer: Self-pay | Admitting: *Deleted

## 2021-12-02 ENCOUNTER — Telehealth (HOSPITAL_BASED_OUTPATIENT_CLINIC_OR_DEPARTMENT_OTHER): Payer: Self-pay | Admitting: *Deleted

## 2021-12-02 ENCOUNTER — Other Ambulatory Visit: Payer: Self-pay

## 2021-12-02 DIAGNOSIS — E7800 Pure hypercholesterolemia, unspecified: Secondary | ICD-10-CM

## 2021-12-02 MED ORDER — ROSUVASTATIN CALCIUM 20 MG PO TABS: 20.0000 mg | ORAL_TABLET | Freq: Every day | ORAL | 1 refills | Status: DC

## 2021-12-02 NOTE — Telephone Encounter (Signed)
Rx(s) sent to pharmacy electronically.  

## 2021-12-22 ENCOUNTER — Encounter: Payer: Self-pay | Admitting: Family Medicine

## 2021-12-22 ENCOUNTER — Ambulatory Visit (INDEPENDENT_AMBULATORY_CARE_PROVIDER_SITE_OTHER): Payer: Medicare Other | Admitting: Family Medicine

## 2021-12-22 VITALS — BP 136/71 | HR 61 | Temp 97.8°F | Ht 63.0 in | Wt 137.8 lb

## 2021-12-22 DIAGNOSIS — E2839 Other primary ovarian failure: Secondary | ICD-10-CM

## 2021-12-22 DIAGNOSIS — M8580 Other specified disorders of bone density and structure, unspecified site: Secondary | ICD-10-CM

## 2021-12-22 DIAGNOSIS — R030 Elevated blood-pressure reading, without diagnosis of hypertension: Secondary | ICD-10-CM | POA: Diagnosis not present

## 2021-12-22 DIAGNOSIS — E7800 Pure hypercholesterolemia, unspecified: Secondary | ICD-10-CM

## 2021-12-22 DIAGNOSIS — Z1211 Encounter for screening for malignant neoplasm of colon: Secondary | ICD-10-CM

## 2021-12-22 DIAGNOSIS — I2510 Atherosclerotic heart disease of native coronary artery without angina pectoris: Secondary | ICD-10-CM

## 2021-12-22 NOTE — Assessment & Plan Note (Signed)
Follows with cardiology.  On aspirin and statin.  We will be rechecking coronary calcium score per patient request.

## 2021-12-22 NOTE — Patient Instructions (Signed)
It was very nice to see you today!  It is common to have a cough for several months after having any viral infection especially COVID.  Please let Dawn Perry know if this does not continue to improve.  We will check a CT scan of your heart and lungs.  Please get your bone density and mammogram soon.  We will order Cologuard for colon cancer screening.  We will see back in year for your next annual checkup.  Come back sooner if needed.  Take care, Dr Jerline Pain  PLEASE NOTE:  If you had any lab tests please let Dawn Perry know if you have not heard back within a few days. You may see your results on mychart before we have a chance to review them but we will give you a call once they are reviewed by Dawn Perry. If we ordered any referrals today, please let Dawn Perry know if you have not heard from their office within the next week.   Please try these tips to maintain a healthy lifestyle:  Eat at least 3 REAL meals and 1-2 snacks per day.  Aim for no more than 5 hours between eating.  If you eat breakfast, please do so within one hour of getting up.   Each meal should contain half fruits/vegetables, one quarter protein, and one quarter carbs (no bigger than a computer mouse)  Cut down on sweet beverages. This includes juice, soda, and sweet tea.   Drink at least 1 glass of water with each meal and aim for at least 8 glasses per day  Exercise at least 150 minutes every week.    Preventive Care 14 Years and Older, Female Preventive care refers to lifestyle choices and visits with your health care provider that can promote health and wellness. Preventive care visits are also called wellness exams. What can I expect for my preventive care visit? Counseling Your health care provider may ask you questions about your: Medical history, including: Past medical problems. Family medical history. Pregnancy and menstrual history. History of falls. Current health, including: Memory and ability to understand  (cognition). Emotional well-being. Home life and relationship well-being. Sexual activity and sexual health. Lifestyle, including: Alcohol, nicotine or tobacco, and drug use. Access to firearms. Diet, exercise, and sleep habits. Work and work Statistician. Sunscreen use. Safety issues such as seatbelt and bike helmet use. Physical exam Your health care provider will check your: Height and weight. These may be used to calculate your BMI (body mass index). BMI is a measurement that tells if you are at a healthy weight. Waist circumference. This measures the distance around your waistline. This measurement also tells if you are at a healthy weight and may help predict your risk of certain diseases, such as type 2 diabetes and high blood pressure. Heart rate and blood pressure. Body temperature. Skin for abnormal spots. What immunizations do I need?  Vaccines are usually given at various ages, according to a schedule. Your health care provider will recommend vaccines for you based on your age, medical history, and lifestyle or other factors, such as travel or where you work. What tests do I need? Screening Your health care provider may recommend screening tests for certain conditions. This may include: Lipid and cholesterol levels. Hepatitis C test. Hepatitis B test. HIV (human immunodeficiency virus) test. STI (sexually transmitted infection) testing, if you are at risk. Lung cancer screening. Colorectal cancer screening. Diabetes screening. This is done by checking your blood sugar (glucose) after you have not eaten for a  while (fasting). Mammogram. Talk with your health care provider about how often you should have regular mammograms. BRCA-related cancer screening. This may be done if you have a family history of breast, ovarian, tubal, or peritoneal cancers. Bone density scan. This is done to screen for osteoporosis. Talk with your health care provider about your test results,  treatment options, and if necessary, the need for more tests. Follow these instructions at home: Eating and drinking  Eat a diet that includes fresh fruits and vegetables, whole grains, lean protein, and low-fat dairy products. Limit your intake of foods with high amounts of sugar, saturated fats, and salt. Take vitamin and mineral supplements as recommended by your health care provider. Do not drink alcohol if your health care provider tells you not to drink. If you drink alcohol: Limit how much you have to 0-1 drink a day. Know how much alcohol is in your drink. In the [place] one drink equals one 12 oz bottle of beer (355 mL), one 5 oz glass of wine (148 mL), or one 1 oz glass of hard liquor (44 mL). Lifestyle Brush your teeth every morning and night with fluoride toothpaste. Floss one time each day. Exercise for at least 30 minutes 5 or more days each week. Do not use any products that contain nicotine or tobacco. These products include cigarettes, chewing tobacco, and vaping devices, such as e-cigarettes. If you need help quitting, ask your health care provider. Do not use drugs. If you are sexually active, practice safe sex. Use a condom or other form of protection in order to prevent STIs. Take aspirin only as told by your health care provider. Make sure that you understand how much to take and what form to take. Work with your health care provider to find out whether it is safe and beneficial for you to take aspirin daily. Ask your health care provider if you need to take a cholesterol-lowering medicine (statin). Find healthy ways to manage stress, such as: Meditation, yoga, or listening to music. Journaling. Talking to a trusted person. Spending time with friends and family. Minimize exposure to UV radiation to reduce your risk of skin cancer. Safety Always wear your seat belt while driving or riding in a vehicle. Do not drive: If you have been drinking alcohol. Do not ride with  someone who has been drinking. When you are tired or distracted. While texting. If you have been using any mind-altering substances or drugs. Wear a helmet and other protective equipment during sports activities. If you have firearms in your house, make sure you follow all gun safety procedures. What's next? Visit your health care provider once a year for an annual wellness visit. Ask your health care provider how often you should have your eyes and teeth checked. Stay up to date on all vaccines. This information is not intended to replace advice given to you by your health care provider. Make sure you discuss any questions you have with your health care provider. Document Revised: 07/03/2020 Document Reviewed: 07/03/2020 Elsevier Patient Education  [employer].

## 2021-12-22 NOTE — Assessment & Plan Note (Signed)
Follows with cardiology.  On Crestor 20 mg daily. We discussed checking labs however she will have this done with cardiology next year.

## 2021-12-22 NOTE — Assessment & Plan Note (Signed)
Recheck bone density scan.

## 2021-12-22 NOTE — Progress Notes (Signed)
   Dawn Perry is a 71 y.o. female who presents today for an office visit.  Assessment/Plan:  New/Acute Problems: Cough No red flags.  Likely postviral cough.  Discussed typical course of recovery and that she may have a postviral cough for several weeks to months after her upper respiratory infection.  We will continue with watchful waiting.  We will be repeating her coronary artery calcium score as below which will allow [place] to look at her lungs as well.  She will let me know if symptoms persist over the next several weeks to months and we can refer to pulmonology.  Chronic Problems Addressed Today: Hyperlipidemia Follows with cardiology.  On Crestor 20 mg daily. We discussed checking labs however she will have this done with cardiology next year.  Osteopenia Recheck bone density scan.  CAD in native artery Follows with cardiology.  On aspirin and statin.  We will be rechecking coronary calcium score per patient request.  Preventative health care Up-to-date on vaccines.  We will check cologuard.  She will be getting mammogram and bone density scan soon.    Subjective:  HPI:  See A/p for status of chronic conditions.   She did have covid a few months ago. She has recovered but still has occasional cough. She has noticed worsening cough after exertion.  Symptoms of been stable last few weeks.  No fevers or chills.  Cough has been nonproductive.       Objective:  Physical Exam: BP 136/71   Pulse 61   Temp 97.8 F (36.6 C) (Temporal)   Ht 5\' 3"  (1.6 m)   Wt 137 lb 12.8 oz (62.5 kg)   SpO2 98%   BMI 24.41 kg/m   Gen: No acute distress, resting comfortably CV: Regular rate and rhythm with no murmurs appreciated Pulm: Normal work of breathing, clear to auscultation bilaterally with no crackles, wheezes, or rhonchi Neuro: Grossly normal, moves all extremities Psych: Normal affect and thought content      Mathilde Mcwherter M. , MD 12/22/2021 10:53 AM

## 2022-01-06 ENCOUNTER — Ambulatory Visit: Payer: Medicare Other | Admitting: Family Medicine

## 2022-01-26 ENCOUNTER — Other Ambulatory Visit: Payer: Medicare Other

## 2022-02-06 ENCOUNTER — Telehealth: Payer: Self-pay | Admitting: Family Medicine

## 2022-02-06 NOTE — Telephone Encounter (Signed)
Patient requests to be called to be given Cologuard results. States she mailed Cologuard to Cologuard approx. 01/06/22.

## 2022-02-09 NOTE — Telephone Encounter (Signed)
LVM to return call.  Spoke with (Cologuard) Lab today, they will send another kit due to problems with sample

## 2022-02-23 DIAGNOSIS — Z96611 Presence of right artificial shoulder joint: Secondary | ICD-10-CM | POA: Diagnosis not present

## 2022-03-05 DIAGNOSIS — Z96641 Presence of right artificial hip joint: Secondary | ICD-10-CM | POA: Diagnosis not present

## 2022-03-09 DIAGNOSIS — Z1211 Encounter for screening for malignant neoplasm of colon: Secondary | ICD-10-CM | POA: Diagnosis not present

## 2022-03-18 LAB — COLOGUARD: COLOGUARD: NEGATIVE

## 2022-03-18 NOTE — Progress Notes (Signed)
Please inform patient of the following:  Good news! Cologuard is negative. We can recheck in 3 years.  Algis Greenhouse. Jerline Pain, MD 03/18/2022 11:08 AM

## 2022-03-20 DIAGNOSIS — M25551 Pain in right hip: Secondary | ICD-10-CM | POA: Diagnosis not present

## 2022-03-25 ENCOUNTER — Telehealth: Payer: Self-pay | Admitting: Cardiovascular Disease

## 2022-03-25 DIAGNOSIS — Z5181 Encounter for therapeutic drug level monitoring: Secondary | ICD-10-CM

## 2022-03-25 DIAGNOSIS — E7800 Pure hypercholesterolemia, unspecified: Secondary | ICD-10-CM

## 2022-03-25 NOTE — Telephone Encounter (Signed)
Patient returned RN's call. 

## 2022-03-25 NOTE — Telephone Encounter (Addendum)
Added LPa, ok per Overton Mam NP  Advised patient, verbalized understanding

## 2022-03-25 NOTE — Telephone Encounter (Signed)
Patient is requesting to speak with Dr. Blenda Mounts nurse regarding having a CT.

## 2022-03-25 NOTE — Telephone Encounter (Signed)
Spoke with patient regarding CT  She was wanting Cardiac CT repeated  Advised patient she would need to have follow up prior to another Cardiac CT being ordered  Has had cough and some shortness of breath lasts several months however had Covid in January  Does have appointment with Overton Mam NP 4/12, will have her put on cancellation list  She would like to get LPa with her LP/CMET prior to follow up  Will forward to Overton Mam NP for review

## 2022-03-25 NOTE — Telephone Encounter (Signed)
Left message to call back  

## 2022-03-26 DIAGNOSIS — L821 Other seborrheic keratosis: Secondary | ICD-10-CM | POA: Diagnosis not present

## 2022-03-26 DIAGNOSIS — L814 Other melanin hyperpigmentation: Secondary | ICD-10-CM | POA: Diagnosis not present

## 2022-03-26 DIAGNOSIS — L570 Actinic keratosis: Secondary | ICD-10-CM | POA: Diagnosis not present

## 2022-03-26 DIAGNOSIS — Z7189 Other specified counseling: Secondary | ICD-10-CM | POA: Diagnosis not present

## 2022-03-26 DIAGNOSIS — D225 Melanocytic nevi of trunk: Secondary | ICD-10-CM | POA: Diagnosis not present

## 2022-03-26 DIAGNOSIS — D2221 Melanocytic nevi of right ear and external auricular canal: Secondary | ICD-10-CM | POA: Diagnosis not present

## 2022-03-31 ENCOUNTER — Ambulatory Visit (HOSPITAL_BASED_OUTPATIENT_CLINIC_OR_DEPARTMENT_OTHER): Payer: Medicare Other | Admitting: Family

## 2022-04-29 ENCOUNTER — Telehealth: Payer: Self-pay | Admitting: Family Medicine

## 2022-04-29 DIAGNOSIS — Z5181 Encounter for therapeutic drug level monitoring: Secondary | ICD-10-CM | POA: Diagnosis not present

## 2022-04-29 DIAGNOSIS — E7800 Pure hypercholesterolemia, unspecified: Secondary | ICD-10-CM | POA: Diagnosis not present

## 2022-04-29 NOTE — Telephone Encounter (Signed)
Contacted Dawn Perry to schedule their annual wellness visit. Appointment made for 05/11/2022.  Gabriel Cirri [facility] AWV TEAM Direct Dial [phone]

## 2022-04-30 LAB — LIPID PANEL
Chol/HDL Ratio: 2.9 ratio (ref 0.0–4.4)
Cholesterol, Total: 149 mg/dL (ref 100–199)
HDL: 51 mg/dL (ref >39)
LDL Chol Calc (NIH): 69 mg/dL (ref 0–99)
Triglycerides: 170 mg/dL — ABNORMAL HIGH (ref 0–149)
VLDL Cholesterol Cal: 29 mg/dL (ref 5–40)

## 2022-04-30 LAB — COMPREHENSIVE METABOLIC PANEL
ALT: 28 IU/L (ref 0–32)
AST: 28 IU/L (ref 0–40)
Albumin/Globulin Ratio: 1.9 (ref 1.2–2.2)
Albumin: 4.5 g/dL (ref 3.8–4.8)
Alkaline Phosphatase: 85 IU/L (ref 44–121)
BUN/Creatinine Ratio: 23 (ref 12–28)
BUN: 19 mg/dL (ref 8–27)
Bilirubin Total: 0.4 mg/dL (ref 0.0–1.2)
CO2: 21 mmol/L (ref 20–29)
Calcium: 9.5 mg/dL (ref 8.7–10.3)
Chloride: 107 mmol/L — ABNORMAL HIGH (ref 96–106)
Creatinine, Ser: 0.84 mg/dL (ref 0.57–1.00)
Globulin, Total: 2.4 g/dL (ref 1.5–4.5)
Glucose: 95 mg/dL (ref 70–99)
Potassium: 4.8 mmol/L (ref 3.5–5.2)
Sodium: 142 mmol/L (ref 134–144)
Total Protein: 6.9 g/dL (ref 6.0–8.5)
eGFR: 74 mL/min/{1.73_m2} (ref >59)

## 2022-04-30 LAB — LIPOPROTEIN A (LPA): Lipoprotein (a): 21.1 nmol/L (ref <75.0)

## 2022-05-01 ENCOUNTER — Encounter (HOSPITAL_BASED_OUTPATIENT_CLINIC_OR_DEPARTMENT_OTHER): Payer: Self-pay | Admitting: Family

## 2022-05-01 ENCOUNTER — Ambulatory Visit (INDEPENDENT_AMBULATORY_CARE_PROVIDER_SITE_OTHER): Payer: Medicare Other | Admitting: Family

## 2022-05-01 VITALS — BP 128/80 | HR 60 | Ht 63.0 in | Wt 144.0 lb

## 2022-05-01 DIAGNOSIS — I25118 Atherosclerotic heart disease of native coronary artery with other forms of angina pectoris: Secondary | ICD-10-CM | POA: Diagnosis not present

## 2022-05-01 DIAGNOSIS — R072 Precordial pain: Secondary | ICD-10-CM | POA: Diagnosis not present

## 2022-05-01 DIAGNOSIS — E7800 Pure hypercholesterolemia, unspecified: Secondary | ICD-10-CM

## 2022-05-01 DIAGNOSIS — E785 Hyperlipidemia, unspecified: Secondary | ICD-10-CM

## 2022-05-01 MED ORDER — ROSUVASTATIN CALCIUM 20 MG PO TABS: 20.0000 mg | ORAL_TABLET | Freq: Every day | ORAL | 3 refills | Status: DC

## 2022-05-01 NOTE — Progress Notes (Signed)
Office Visit    Patient Name: Dawn Perry Date of Encounter: 05/01/2022  PCP:  Ardith Dark, MD   [facility]  Cardiologist:  Chilton Si, MD  Advanced Practice Provider:  No care team member to display Electrophysiologist:  None    Chief Complaint    Dawn Perry is a 72 y.o. female presents today for exertional dyspnea   Past Medical History    Past Medical History:  Diagnosis Date   CAD in native artery 09/26/2020   Elevated blood pressure reading 09/26/2020   Family history of adverse reaction to anesthesia    mother had problems with N/V   GERD (gastroesophageal reflux disease)    B hands   H/O scarlet fever    age 68   Heart murmur    History of chicken pox    Hyperlipidemia    LBBB (left bundle branch block)    Osteoarthritis    PONV (postoperative nausea and vomiting)    Past Surgical History:  Procedure Laterality Date   Broke Left forearm     fracture left arm surgery      REVERSE SHOULDER ARTHROPLASTY Right 02/06/2021   Procedure: Right Reverse shoulder arthroplasty; hardware removal;  Surgeon: Francena Hanly, MD;  Location: [facility];  Service: Orthopedics;  Laterality: Right;  , screw removed   right shoulder surgery      Right shoulder tear  01/20/1988   TOTAL HIP ARTHROPLASTY Right 10/09/2020   Procedure: TOTAL HIP ARTHROPLASTY ANTERIOR APPROACH;  Surgeon: Ollen Gross, MD;  Location: [facility];  Service: Orthopedics;  Laterality: Right;    Allergies  No Known Allergies  History of Present Illness    Dawn Perry is a 72 y.o. female with a hx of nonobstructive CAD, hyperlipidemia, left bundle branch block last seen 07/29/2021 by Dr. Duke Salvia.  Prior patient of Norma Fredrickson, NP.  Prior Myoview 2004 at time of initial diagnosis LBBB negative for ischemia.  Echo 2012 LVEF 55%, mild MR. Coronary CT 11/2017 consistent with nonobstructive three-vessel coronary disease with negative FFR.  Calcium score  357 placing her in the 92nd percentile.  Last seen 07/29/2021 by Dr. Duke Salvia.  BP in clinic and at home routinely 120s over 80s.  She noted occasional tingling sensation along jawline of both sides which is difficult to describe but no exertional symptoms.  Presents today for follow-up independently. Recently went on 3 month trip across the [place] and visited multiple national parks.  Did note with walking in the park she felt exertional dyspnea despite having walked in her neighborhood prior to the trip.  Since return she notes dyspnea with activity.  Not associated with chest pain or palpitations.    Reports no chest pain, pressure, or tightness. No edema, orthopnea, PND. Reports no palpitations.     EKGs/Labs/Other Studies Reviewed:   The following studies were reviewed today:  Cardiac Studies & Procedures          CT SCANS  CT CORONARY MORPH W/CTA COR W/SCORE 11/29/2017  Addendum 11/29/2017  5:31 PM ADDENDUM REPORT: 11/29/2017 17:28  CLINICAL DATA:  Chest pain  EXAM: Cardiac CTA  MEDICATIONS: Sub lingual nitro.  and lopressor   TECHNIQUE: The patient was scanned on a [employer] 192 scanner. Gantry rotation speed was 250 msecs. Collimation was. 6 mm . A 120 kV prospective scan was triggered in the ascending thoracic aorta at 140 HU's with full mA between 30-70% of the R-R interval . Average HR during  the scan was 61 bpm. The 3D data set was interpreted on a dedicated work station using MPR, MIP and VRT modes. A total of 80cc of contrast was used.  FINDINGS: Non-cardiac: See separate report from [facility]. No significant findings on limited lung and soft tissue windows.  Calcium score: Calcium noted in all 3 major epicardial coronary vessels  Coronary Arteries: Right dominant with no anomalies  LM: Normal  LAD: 50% proximal calcific disease includes ostium of D1 less than 50% mid vessel disease  D1: Normal  D2: Normal  D3:  Normal  Circumflex: Less than 50% calcific proximal and mid vessel disease  OM1: Normal  AV groove : Normal  RCA: Less than 50% proximal and mid vessel disease  PDA: Normal  PLA: Normal  IMPRESSION: 1. Likely non obstructive 3 vessel CAD. Proximal LAD seems worst will be sent for FFR CT  2.  Calcium score 357 which is 92 nd percentile for age and sex  3.  Normal aortic root 2.8 cm  Charlton Haws   Electronically Signed By: Charlton Haws M.D. On: 11/29/2017 17:28  Narrative EXAM: OVER-READ INTERPRETATION  CT CHEST  The following report is an over-read performed by radiologist Dr. Charlett Nose of [facility] on 11/29/2017. This over-read does not include interpretation of cardiac or coronary anatomy or pathology. The coronary CTA interpretation by the cardiologist is attached.  COMPARISON:  11/10/2017  FINDINGS: Vascular: Heart is borderline in size. Visualized aorta is normal caliber.  Mediastinum/Nodes: No adenopathy in the lower mediastinum or hila.  Lungs/Pleura: Small scattered pulmonary nodules are again noted, 5 mm or less in size as described on prior study. No effusions.  Upper Abdomen: Imaging into the upper abdomen shows no acute findings.  Musculoskeletal: Chest wall soft tissues are unremarkable. No acute bony abnormality.  IMPRESSION: Small pulmonary nodules, 5 mm or less in size. No follow-up needed if patient is low-risk (and has no known or suspected primary neoplasm). Non-contrast chest CT can be considered in 12 months if patient is high-risk. This recommendation follows the consensus statement: Guidelines for Management of Incidental Pulmonary Nodules Detected on CT Images: From the Fleischner Society 2017; Radiology 2017; 284:228-243.  Electronically Signed: By: Charlett Nose M.D. On: 11/29/2017 16:37   CT SCANS  CT CARDIAC SCORING (SELF PAY ONLY) 11/10/2017  Addendum 11/10/2017  4:49 PM ADDENDUM REPORT:  11/10/2017 16:47  EXAM: OVER-READ INTERPRETATION  CT CHEST  The following report is an over-read performed by radiologist Dr. Alver Fisher [facility] on 11/10/2017. This over-read does not include interpretation of cardiac or coronary anatomy or pathology. The interpretation by the cardiologist is attached.  COMPARISON:  None.  FINDINGS: Few pulmonary nodules measure up to 5 mm in the right lower lobe. No pleural fluid. Visualized upper abdomen is unremarkable. Visualized osseous structures show degenerative changes in the spine. (Series 3, image 25).  IMPRESSION: Pulmonary nodules measure 5 mm or less in size. No follow-up needed if patient is low-risk (and has no known or suspected primary neoplasm). Non-contrast chest CT can be considered in 12 months if patient is high-risk. This recommendation follows the consensus statement: Guidelines for Management of Incidental Pulmonary Nodules Detected on CT Images: From the Fleischner Society 2017; Radiology 2017; 284:228-243.   Electronically Signed By: Leanna Battles M.D. On: 11/10/2017 16:47  Narrative CLINICAL DATA:  Risk stratification  EXAM: Coronary Calcium Score  MEDICATIONS: None.  TECHNIQUE: The patient was scanned on a [employer]. Axial non-contrast 3  mm slices were carried out through the heart. The data set was analyzed on a dedicated work station and scored using the Agatson method.  FINDINGS: Non-cardiac: See separate report from [facility].  Ascending Aorta: Normal size, trivial calcifications.  Pericardium: Normal, mild focal calcifications.  Coronary arteries: Normal origin.  IMPRESSION: Coronary calcium score of 259. This was 74 percentile for age and sex matched control.  Tobias Alexander  Electronically Signed: By: Tobias Alexander On: 11/10/2017 12:54           EKG:  EKG is not ordered today.    Recent Labs: 04/29/2022: ALT 28; BUN 19;  Creatinine, Ser 0.84; Potassium 4.8; Sodium 142  Recent Lipid Panel    Component Value Date/Time   CHOL 149 04/29/2022 0911   TRIG 170 (H) 04/29/2022 0911   HDL 51 04/29/2022 0911   CHOLHDL 2.9 04/29/2022 0911   CHOLHDL 5.0 10/18/2015 0924   VLDL 31 (H) 10/18/2015 0924   LDLCALC 69 04/29/2022 0911   LDLDIRECT 132.0 02/15/2013 1006    Home Medications   Current Meds  Medication Sig   aspirin EC 81 MG tablet Take 81 mg by mouth every other day. Swallow whole.   Calcium Citrate-Vitamin D (CITRACAL + D PO) Take 1 tablet by mouth daily.   Multiple Vitamin (MULTI-VITAMIN DAILY) TABS Take 1 tablet by mouth daily.   [DISCONTINUED] aspirin EC 81 MG tablet Take 81 mg by mouth daily. Swallow whole.   [DISCONTINUED] rosuvastatin (CRESTOR) 20 MG tablet Take 1 tablet (20 mg total) by mouth daily.     Review of Systems      All other systems reviewed and are otherwise negative except as noted above.  Physical Exam    VS:  BP 128/80   Pulse 60   Ht  (1.6 m)   Wt 144 lb (65.3 kg)   BMI 25.51 kg/m  , BMI Body mass index is 25.51 kg/m.  Wt Readings from Last 3 Encounters:  05/01/22 144 lb (65.3 kg)  12/22/21 137 lb 12.8 oz (62.5 kg)  07/29/21 137 lb 4.8 oz (62.3 kg)     GEN: Well nourished, well developed, in no acute distress. HEENT: normal. Neck: Supple, no JVD, carotid bruits, or masses. Cardiac: RRR, no murmurs, rubs, or gallops. No clubbing, cyanosis, edema.  Radials/PT 2+ and equal bilaterally.  Respiratory:  Respirations regular and unlabored, clear to auscultation bilaterally. GI: Soft, nontender, nondistended. MS: No deformity or atrophy. Skin: Warm and dry, no rash. Neuro:  Strength and sensation are intact. Psych: Normal affect.  Assessment & Plan    Nonobstructive CAD - Notes exertional dyspnea but no significant chest pain. No edema, orthopnea suggestive of heart failure. Given >4 years since last CTA, will update coronary CTA to reassess coronary anatomy and  rule out ischemia. She is aware of nitroglycerin use during study.  GDMT aspirin, rosuvastatin.  HLD, LDL goal less than 70-04/20/2022 LP(a) 21.1, CMP unremarkable, LDL 69.  Continue rosuvastatin 20 mg daily.  Refill provided.  Previously elevated blood pressure reading-BP today at goal of less than 130/80 and not requiring antihypertensive agent.  Continue home monitoring.         Disposition: Follow up in 6 week(s) with Chilton Si, MD or APP.  Signed, Alver Sorrow, NP 05/01/2022, 4:21 PM [facility]

## 2022-05-01 NOTE — Patient Instructions (Signed)
Medication Instructions:  Your physician recommends that you continue on your current medications as directed. Please refer to the Current Medication list given to you today.  Testing/Procedures:   Your cardiac CT will be scheduled at one of the below locations:   [facility] [address] [place], [place] [address] [phone]  If scheduled at [facility], please arrive at the [facility] Entrance (Entrance C2) of [facility] 30 minutes prior to test start time. You can use the FREE valet parking offered at entrance C (encouraged to control the heart rate for the test)  Proceed to the [facility] Radiology Department (first floor) to check-in and test prep.  All radiology patients and guests should use entrance C2 at [facility], accessed from [facility], even though the hospital's physical address listed is [address].     Please follow these instructions carefully (unless otherwise directed):  On the Night Before the Test: Be sure to Drink plenty of water. Do not consume any caffeinated/decaffeinated beverages or chocolate 12 hours prior to your test. Do not take any antihistamines 12 hours prior to your test. On the Day of the Test: Drink plenty of water until 1 hour prior to the test. Do not eat any food 1 hour prior to test. You may take your regular medications prior to the test.  FEMALES- please wear underwire-free bra if available, avoid dresses & tight clothing  After the Test: Drink plenty of water. After receiving IV contrast, you may experience a mild flushed feeling. This is normal. On occasion, you may experience a mild rash up to 24 hours after the test. This is not dangerous. If this occurs, you can take Benadryl 25 mg and increase your fluid intake. If you experience trouble breathing, this can be serious. If it is severe call 911 IMMEDIATELY. If it is mild, please call our office. If  you take any of these medications: Glipizide/Metformin, Avandament, Glucavance, please do not take 48 hours after completing test unless otherwise instructed.  We will call to schedule your test 2-4 weeks out understanding that some insurance companies will need an authorization prior to the service being performed.   For non-scheduling related questions, please contact the cardiac imaging nurse navigator should you have any questions/concerns: Rockwell Alexandria, Cardiac Imaging Nurse Navigator Larey Brick, Cardiac Imaging Nurse Navigator [facility] Direct Office Dial: [phone]   For scheduling needs, including cancellations and rescheduling, please call [place], [phone].  Follow-Up: At [facility], you and your health needs are our priority.  As part of our continuing mission to provide you with exceptional heart care, we have created designated Provider Care Teams.  These Care Teams include your primary Cardiologist (physician) and Advanced Practice Providers (APPs -  Physician Assistants and Nurse Practitioners) who all work together to provide you with the care you need, when you need it.  We recommend signing up for the patient portal called "MyChart".  Sign up information is provided on this After Visit Summary.  MyChart is used to connect with patients for Virtual Visits (Telemedicine).  Patients are able to view lab/test results, encounter notes, upcoming appointments, etc.  Non-urgent messages can be sent to your provider as well.   To learn more about what you can do with MyChart, go to [email or web address].    Your next appointment:   4-6 week(s)  Provider:   Chilton Si, MD or Gillian Shields, NP

## 2022-05-11 ENCOUNTER — Ambulatory Visit (INDEPENDENT_AMBULATORY_CARE_PROVIDER_SITE_OTHER): Payer: Medicare Other

## 2022-05-11 ENCOUNTER — Telehealth (HOSPITAL_COMMUNITY): Payer: Self-pay | Admitting: *Deleted

## 2022-05-11 VITALS — Wt 144.0 lb

## 2022-05-11 DIAGNOSIS — Z1231 Encounter for screening mammogram for malignant neoplasm of breast: Secondary | ICD-10-CM

## 2022-05-11 DIAGNOSIS — E2839 Other primary ovarian failure: Secondary | ICD-10-CM

## 2022-05-11 DIAGNOSIS — Z0000 Encounter for general adult medical examination without abnormal findings: Secondary | ICD-10-CM

## 2022-05-11 NOTE — Patient Instructions (Signed)
Ms. Dawn Perry , Thank you for taking time to come for your Medicare Wellness Visit. I appreciate your ongoing commitment to your health goals. Please review the following plan we discussed and let me know if I can assist you in the future.   These are the goals we discussed:  Goals   None     This is a list of the screening recommended for you and due dates:  Health Maintenance  Topic Date Due   Zoster (Shingles) Vaccine (1 of 2) Never done   DEXA scan (bone density measurement)  04/23/2021   Mammogram  12/23/2022*   Hepatitis C Screening: USPSTF Recommendation to screen - Ages 18-79 yo.  12/23/2022*   COVID-19 Vaccine (5 - 2023-24 season) 01/08/2023*   Flu Shot  08/20/2022   Medicare Annual Wellness Visit  05/11/2023   Cologuard (Stool DNA test)  03/09/2025   Pneumonia Vaccine  Completed   HPV Vaccine  Aged Out   DTaP/Tdap/Td vaccine  Discontinued  *Topic was postponed. The date shown is not the original due date.    Advanced directives: Please bring a copy of your health care power of attorney and living will to the office at your convenience.  Conditions/risks identified: walk more  Next appointment: Follow up in one year for your annual wellness visit    Preventive Care 65 Years and Older, Female Preventive care refers to lifestyle choices and visits with your health care provider that can promote health and wellness. What does preventive care include? A yearly physical exam. This is also called an annual well check. Dental exams once or twice a year. Routine eye exams. Ask your health care provider how often you should have your eyes checked. Personal lifestyle choices, including: Daily care of your teeth and gums. Regular physical activity. Eating a healthy diet. Avoiding tobacco and drug use. Limiting alcohol use. Practicing safe sex. Taking low-dose aspirin every day. Taking vitamin and mineral supplements as recommended by your health care provider. What happens  during an annual well check? The services and screenings done by your health care provider during your annual well check will depend on your age, overall health, lifestyle risk factors, and family history of disease. Counseling  Your health care provider may ask you questions about your: Alcohol use. Tobacco use. Drug use. Emotional well-being. Home and relationship well-being. Sexual activity. Eating habits. History of falls. Memory and ability to understand (cognition). Work and work Astronomer. Reproductive health. Screening  You may have the following tests or measurements: Height, weight, and BMI. Blood pressure. Lipid and cholesterol levels. These may be checked every 5 years, or more frequently if you are over 58 years old. Skin check. Lung cancer screening. You may have this screening every year starting at age 26 if you have a 30-pack-year history of smoking and currently smoke or have quit within the past 15 years. Fecal occult blood test (FOBT) of the stool. You may have this test every year starting at age 69. Flexible sigmoidoscopy or colonoscopy. You may have a sigmoidoscopy every 5 years or a colonoscopy every 10 years starting at age 32. Hepatitis C blood test. Hepatitis B blood test. Sexually transmitted disease (STD) testing. Diabetes screening. This is done by checking your blood sugar (glucose) after you have not eaten for a while (fasting). You may have this done every 1-3 years. Bone density scan. This is done to screen for osteoporosis. You may have this done starting at age 53. Mammogram. This may be done every  1-2 years. Talk to your health care provider about how often you should have regular mammograms. Talk with your health care provider about your test results, treatment options, and if necessary, the need for more tests. Vaccines  Your health care provider may recommend certain vaccines, such as: Influenza vaccine. This is recommended every  year. Tetanus, diphtheria, and acellular pertussis (Tdap, Td) vaccine. You may need a Td booster every 10 years. Zoster vaccine. You may need this after age 76. Pneumococcal 13-valent conjugate (PCV13) vaccine. One dose is recommended after age 62. Pneumococcal polysaccharide (PPSV23) vaccine. One dose is recommended after age 2. Talk to your health care provider about which screenings and vaccines you need and how often you need them. This information is not intended to replace advice given to you by your health care provider. Make sure you discuss any questions you have with your health care provider. Document Released: 02/01/2015 Document Revised: 09/25/2015 Document Reviewed: 11/06/2014 Elsevier Interactive Patient Education  2017 [place] Prevention in the Home Falls can cause injuries. They can happen to people of all ages. There are many things you can do to make your home safe and to help prevent falls. What can I do on the outside of my home? Regularly fix the edges of walkways and driveways and fix any cracks. Remove anything that might make you trip as you walk through a door, such as a raised step or threshold. Trim any bushes or trees on the path to your home. Use bright outdoor lighting. Clear any walking paths of anything that might make someone trip, such as rocks or tools. Regularly check to see if handrails are loose or broken. Make sure that both sides of any steps have handrails. Any raised decks and porches should have guardrails on the edges. Have any leaves, snow, or ice cleared regularly. Use sand or salt on walking paths during winter. Clean up any spills in your garage right away. This includes oil or grease spills. What can I do in the bathroom? Use night lights. Install grab bars by the toilet and in the tub and shower. Do not use towel bars as grab bars. Use non-skid mats or decals in the tub or shower. If you need to sit down in the shower, use a  plastic, non-slip stool. Keep the floor dry. Clean up any water that spills on the floor as soon as it happens. Remove soap buildup in the tub or shower regularly. Attach bath mats securely with double-sided non-slip rug tape. Do not have throw rugs and other things on the floor that can make you trip. What can I do in the bedroom? Use night lights. Make sure that you have a light by your bed that is easy to reach. Do not use any sheets or blankets that are too big for your bed. They should not hang down onto the floor. Have a firm chair that has side arms. You can use this for support while you get dressed. Do not have throw rugs and other things on the floor that can make you trip. What can I do in the kitchen? Clean up any spills right away. Avoid walking on wet floors. Keep items that you use a lot in easy-to-reach places. If you need to reach something above you, use a strong step stool that has a grab bar. Keep electrical cords out of the way. Do not use floor polish or wax that makes floors slippery. If you must use wax, use non-skid  floor wax. Do not have throw rugs and other things on the floor that can make you trip. What can I do with my stairs? Do not leave any items on the stairs. Make sure that there are handrails on both sides of the stairs and use them. Fix handrails that are broken or loose. Make sure that handrails are as long as the stairways. Check any carpeting to make sure that it is firmly attached to the stairs. Fix any carpet that is loose or worn. Avoid having throw rugs at the top or bottom of the stairs. If you do have throw rugs, attach them to the floor with carpet tape. Make sure that you have a light switch at the top of the stairs and the bottom of the stairs. If you do not have them, ask someone to add them for you. What else can I do to help prevent falls? Wear shoes that: Do not have high heels. Have rubber bottoms. Are comfortable and fit you  well. Are closed at the toe. Do not wear sandals. If you use a stepladder: Make sure that it is fully opened. Do not climb a closed stepladder. Make sure that both sides of the stepladder are locked into place. Ask someone to hold it for you, if possible. Clearly mark and make sure that you can see: Any grab bars or handrails. First and last steps. Where the edge of each step is. Use tools that help you move around (mobility aids) if they are needed. These include: Canes. Walkers. Scooters. Crutches. Turn on the lights when you go into a dark area. Replace any light bulbs as soon as they burn out. Set up your furniture so you have a clear path. Avoid moving your furniture around. If any of your floors are uneven, fix them. If there are any pets around you, be aware of where they are. Review your medicines with your doctor. Some medicines can make you feel dizzy. This can increase your chance of falling. Ask your doctor what other things that you can do to help prevent falls. This information is not intended to replace advice given to you by your health care provider. Make sure you discuss any questions you have with your health care provider. Document Released: 11/01/2008 Document Revised: 06/13/2015 Document Reviewed: 02/09/2014 Elsevier Interactive Patient Education  2017 [employer].

## 2022-05-11 NOTE — Progress Notes (Signed)
Subjective:   Dawn Perry is a 72 y.o. female who presents for Medicare Annual (Subsequent) preventive examination.      Patient Medicare AWV questionnaire was completed by the patient on 05/07/22; I have confirmed that all information answered by patient is correct and no changes since this date.    Review of Systems     Cardiac Risk Factors include: advanced age (>74men, >58 women);dyslipidemia     Objective:    Today's Vitals   05/11/22 1530  Weight: 144 lb (65.3 kg)   Body mass index is 25.51 kg/m.     05/11/2022    3:35 PM 02/06/2021    6:01 AM 01/24/2021    9:56 AM 10/09/2020    3:40 PM 10/01/2020    9:52 AM 01/10/2019    8:42 AM  Advanced Directives  Does Patient Have a Medical Advance Directive? Yes Yes Yes Yes Yes Yes  Type of Estate agent of [place];Living will  Healthcare Power of [place];Living will Living will;Out of facility DNR (pink MOST or yellow form) Healthcare Power of [place];Living will Living will;Healthcare Power of Attorney  Does patient want to make changes to medical advance directive?    No - Patient declined  No - Patient declined  Copy of Healthcare Power of Attorney in Chart? No - copy requested No - copy requested No - copy requested   No - copy requested    Current Medications (verified) Outpatient Encounter Medications as of 05/11/2022  Medication Sig   aspirin EC 81 MG tablet Take 81 mg by mouth every other day. Swallow whole.   Calcium Citrate-Vitamin D (CITRACAL + D PO) Take 1 tablet by mouth daily.   Multiple Vitamin (MULTI-VITAMIN DAILY) TABS Take 1 tablet by mouth daily.   rosuvastatin (CRESTOR) 20 MG tablet Take 1 tablet (20 mg total) by mouth daily.   No facility-administered encounter medications on file as of 05/11/2022.    Allergies (verified) Patient has no known allergies.   History: Past Medical History:  Diagnosis Date   CAD in native artery 09/26/2020   Elevated blood pressure reading  09/26/2020   Family history of adverse reaction to anesthesia    mother had problems with N/V   GERD (gastroesophageal reflux disease)    B hands   H/O scarlet fever    age 82   Heart murmur    History of chicken pox    Hyperlipidemia    LBBB (left bundle branch block)    Osteoarthritis    PONV (postoperative nausea and vomiting)    Past Surgical History:  Procedure Laterality Date   Broke Left forearm     fracture left arm surgery      REVERSE SHOULDER ARTHROPLASTY Right 02/06/2021   Procedure: Right Reverse shoulder arthroplasty; hardware removal;  Surgeon: Francena Hanly, MD;  Location: [facility];  Service: Orthopedics;  Laterality: Right;  , screw removed   right shoulder surgery      Right shoulder tear  01/20/1988   TOTAL HIP ARTHROPLASTY Right 10/09/2020   Procedure: TOTAL HIP ARTHROPLASTY ANTERIOR APPROACH;  Surgeon: Ollen Gross, MD;  Location: [facility];  Service: Orthopedics;  Laterality: Right;   Family History  Problem Relation Age of Onset   Arthritis Father    Parkinson's disease Father    Hyperlipidemia Mother    Other Mother        abnormal EKG's   Arthritis Mother    Stroke Mother        hemorrhagic  Parkinsonism Other    Coronary artery disease Brother        38   Cancer Neg Hx    Social History   Socioeconomic History   Marital status: Married    Spouse name: Not on file   Number of children: 2   Years of education: Not on file   Highest education level: Not on file  Occupational History   Occupation: Real Psychologist, occupational  Tobacco Use   Smoking status: Never   Smokeless tobacco: Never  Vaping Use   Vaping Use: Never used  Substance and Sexual Activity   Alcohol use: Never   Drug use: Never   Sexual activity: Yes    Partners: Male  Other Topics Concern   Not on file  Social History Narrative   1 small dog    Social Determinants of Health   Financial Resource Strain: Low Risk  (05/11/2022)   Overall Financial Resource Strain  (CARDIA)    Difficulty of Paying Living Expenses: Not hard at all  Food Insecurity: No Food Insecurity (05/07/2022)   Hunger Vital Sign    Worried About Running Out of Food in the Last Year: Never true    Ran Out of Food in the Last Year: Never true  Transportation Needs: No Transportation Needs (05/07/2022)   PRAPARE - Administrator, Civil Service (Medical): No    Lack of Transportation (Non-Medical): No  Physical Activity: Insufficiently Active (05/07/2022)   Exercise Vital Sign    Days of Exercise per Week: 1 day    Minutes of Exercise per Session: 30 min  Stress: No Stress Concern Present (05/07/2022)   [employer] of Occupational Health - Occupational Stress Questionnaire    Feeling of Stress : Only a little  Social Connections: Moderately Isolated (05/07/2022)   Social Connection and Isolation Panel [NHANES]    Frequency of Communication with Friends and Family: More than three times a week    Frequency of Social Gatherings with Friends and Family: Once a week    Attends Religious Services: Never    Database administrator or Organizations: No    Attends Engineer, structural: Never    Marital Status: Married    Tobacco Counseling Counseling given: Not Answered   Clinical Intake:  Pre-visit preparation completed: Yes  Pain : No/denies pain     BMI - recorded: 25.51 Nutritional Status: BMI 25 -29 Overweight Nutritional Risks: None Diabetes: No  How often do you need to have someone help you when you read instructions, pamphlets, or other written materials from your doctor or pharmacy?: 1 - Never  Diabetic?no  Interpreter Needed?: No  Information entered by :: Lanier Ensign, LPN   Activities of Daily Living    05/07/2022    8:57 AM  In your present state of health, do you have any difficulty performing the following activities:  Hearing? 0  Vision? 0  Difficulty concentrating or making decisions? 0  Walking or climbing stairs? 0   Dressing or bathing? 0  Doing errands, shopping? 0  Preparing Food and eating ? N  Using the Toilet? N  In the past six months, have you accidently leaked urine? N  Do you have problems with loss of bowel control? N  Managing your Medications? N  Managing your Finances? N  Housekeeping or managing your Housekeeping? N    Patient Care Team: Ardith Dark, MD as PCP - General (Family Medicine) Chilton Si, MD as PCP - Cardiology (Cardiology)  Laurey Morale, MD (Cardiology) Mateo Flow, MD (Ophthalmology) Cherlyn Roberts, MD (Dermatology)  Indicate any recent Medical Services you may have received from other than [facility] providers in the past year (date may be approximate).     Assessment:   This is a routine wellness examination for Jazline.  Hearing/Vision screen Hearing Screening - Comments:: Pt denies any hearing issues  Vision Screening - Comments:: Pt [facility] for annual [facility] exams   Dietary issues and exercise activities discussed: Current Exercise Habits: Home exercise routine, Type of exercise: walking, Time (Minutes): 30, Frequency (Times/Week): 1, Weekly Exercise (Minutes/Week): 30   Goals Addressed             This Visit's Progress    Patient Stated       Walk more        Depression Screen    05/11/2022    3:34 PM 12/22/2021   10:28 AM 01/10/2019    8:49 AM 09/07/2017    8:24 AM  PHQ 2/9 Scores  PHQ - 2 Score 0 0 0 0    Fall Risk    05/07/2022    8:57 AM 12/22/2021   10:28 AM 01/10/2019    8:49 AM 12/12/2018    4:27 PM 09/07/2017    8:24 AM  Fall Risk   Falls in the past year? 0 0 0 0 No  Comment    Emmi Telephone Survey: data to providers prior to load   Number falls in past yr: 0 0     Injury with Fall? 0 0 0    Risk for fall due to : Impaired vision No Fall Risks     Follow up Falls prevention discussed  Falls evaluation completed;Education provided;Falls prevention discussed      FALL RISK PREVENTION  PERTAINING TO THE HOME:  Any stairs in or around the home? Yes  If so, are there any without handrails? No  Home free of loose throw rugs in walkways, pet beds, electrical cords, etc? Yes  Adequate lighting in your home to reduce risk of falls? Yes   ASSISTIVE DEVICES UTILIZED TO PREVENT FALLS:  Life alert? No  Use of a cane, walker or w/c? No  Grab bars in the bathroom? Yes  Shower chair or bench in shower? Yes  Elevated toilet seat or a handicapped toilet? No   TIMED UP AND GO:  Was the test performed? No .   Cognitive Function:        05/11/2022    3:37 PM  6CIT Screen  What Year? 0 points  What month? 0 points  What time? 0 points  Count back from 20 0 points  Months in reverse 0 points  Repeat phrase 0 points  Total Score 0 points    Immunizations Immunization History  Administered Date(s) Administered   Fluad Quad(high Dose 65+) 11/08/2018, 12/29/2019   Influenza, High Dose Seasonal PF 11/10/2017   Influenza,inj,Quad PF,6+ Mos 11/01/2012   PFIZER(Purple Top)SARS-COV-2 Vaccination 02/09/2019, 03/02/2019, 10/26/2019   Pfizer Covid-19 Vaccine Bivalent Booster 36yrs & up 12/28/2020   Pneumococcal Conjugate-13 11/08/2018   Pneumococcal Polysaccharide-23 12/29/2019   Tdap 07/29/2011   Zoster, Live 07/29/2011      Flu Vaccine status: Declined, Education has been provided regarding the importance of this vaccine but patient still declined. Advised may receive this vaccine at local pharmacy or Health Dept. Aware to provide a copy of the vaccination record if obtained from local pharmacy or Health Dept. Verbalized acceptance and understanding.  Pneumococcal vaccine status: Up to date  Covid-19 vaccine status: Completed vaccines  Qualifies for Shingles Vaccine? Yes   Zostavax completed No   Shingrix Completed?: No.    Education has been provided regarding the importance of this vaccine. Patient has been advised to call insurance company to determine out of pocket  expense if they have not yet received this vaccine. Advised may also receive vaccine at local pharmacy or Health Dept. Verbalized acceptance and understanding.  Screening Tests Health Maintenance  Topic Date Due   Zoster Vaccines- Shingrix (1 of 2) Never done   DEXA SCAN  04/23/2021   MAMMOGRAM  12/23/2022 (Originally 10/14/2019)   Hepatitis C Screening  12/23/2022 (Originally 03/16/1968)   COVID-19 Vaccine (5 - 2023-24 season) 01/08/2023 (Originally 09/19/2021)   INFLUENZA VACCINE  08/20/2022   Medicare Annual Wellness (AWV)  05/11/2023   Fecal DNA (Cologuard)  03/09/2025   Pneumonia Vaccine 34+ Years old  Completed   HPV VACCINES  Aged Out   DTaP/Tdap/Td  Discontinued    Health Maintenance  Health Maintenance Due  Topic Date Due   Zoster Vaccines- Shingrix (1 of 2) Never done   DEXA SCAN  04/23/2021    Colorectal cancer screening: Type of screening: Cologuard. Completed 03/09/22. Repeat every 3 years  Mammogram status: Ordered 05/11/22. Pt provided with contact info and advised to call to schedule appt.   Bone Density status: Ordered 05/11/22. Pt provided with contact info and advised to call to schedule appt.   Additional Screening:  Hepatitis C Screening: does qualify;  Vision Screening: Recommended annual ophthalmology exams for early detection of glaucoma and other disorders of the eye. Is the patient up to date with their annual eye exam?  Yes  Who is the provider or what is the name of the office in which the patient attends annual eye exams? [facility]  If pt is not established with a provider, would they like to be referred to a provider to establish care? No .   Dental Screening: Recommended annual dental exams for proper oral hygiene  Community Resource Referral / Chronic Care Management: CRR required this visit?  No   CCM required this visit?  No      Plan:     I have personally reviewed and noted the following in the patient's chart:   Medical and social  history Use of alcohol, tobacco or illicit drugs  Current medications and supplements including opioid prescriptions. Patient is not currently taking opioid prescriptions. Functional ability and status Nutritional status Physical activity Advanced directives List of other physicians Hospitalizations, surgeries, and ER visits in previous 12 months Vitals Screenings to include cognitive, depression, and falls Referrals and appointments  In addition, I have reviewed and discussed with patient certain preventive protocols, quality metrics, and best practice recommendations. A written personalized care plan for preventive services as well as general preventive health recommendations were provided to patient.     Marzella Schlein, LPN   1/61/0960   Nurse Notes: none

## 2022-05-11 NOTE — Telephone Encounter (Signed)
Patient returning call about her upcoming cardiac imaging study; pt verbalizes understanding of appt date/time, parking situation and where to check in, pre-test NPO status and verified current allergies; name and call back number provided for further questions should they arise  Larey Brick RN Navigator Cardiac Imaging [facility] [phone] office [phone] cell  Patient aware to arrive at 11:30am.

## 2022-05-11 NOTE — Telephone Encounter (Signed)
Attempted to call patient regarding upcoming cardiac CT appointment. °Left message on voicemail with name and callback number ° °Oluchi Pucci RN Navigator Cardiac Imaging °[facility] °[phone] Office °[phone] Cell ° °

## 2022-05-12 ENCOUNTER — Ambulatory Visit (HOSPITAL_COMMUNITY)
Admission: RE | Admit: 2022-05-12 | Discharge: 2022-05-12 | Disposition: A | Discharge status: Home / Self Care | Payer: Medicare Other | Source: Ambulatory Visit | Attending: Family | Admitting: Family

## 2022-05-12 DIAGNOSIS — I2510 Atherosclerotic heart disease of native coronary artery without angina pectoris: Secondary | ICD-10-CM

## 2022-05-12 DIAGNOSIS — R072 Precordial pain: Secondary | ICD-10-CM | POA: Diagnosis not present

## 2022-05-12 DIAGNOSIS — R931 Abnormal findings on diagnostic imaging of heart and coronary circulation: Secondary | ICD-10-CM | POA: Insufficient documentation

## 2022-05-12 MED ORDER — METOPROLOL TARTRATE 5 MG/5ML IV SOLN
10.0000 mg | INTRAVENOUS | Status: DC | PRN
  Administered 2022-05-12: 10 mg via INTRAVENOUS

## 2022-05-12 MED ORDER — NITROGLYCERIN 0.4 MG SL SUBL
0.8000 mg | SUBLINGUAL_TABLET | Freq: Once | SUBLINGUAL | Status: AC
  Administered 2022-05-12: 0.8 mg via SUBLINGUAL

## 2022-05-12 MED ORDER — NITROGLYCERIN 0.4 MG SL SUBL
SUBLINGUAL_TABLET | SUBLINGUAL | Status: AC
  Filled 2022-05-12: qty 2

## 2022-05-12 MED ORDER — METOPROLOL TARTRATE 5 MG/5ML IV SOLN
INTRAVENOUS | Status: AC
  Filled 2022-05-12: qty 20

## 2022-05-12 MED ORDER — IOHEXOL 350 MG/ML SOLN
95.0000 mL | Freq: Once | INTRAVENOUS | Status: AC | PRN
  Administered 2022-05-12: 95 mL via INTRAVENOUS

## 2022-05-13 ENCOUNTER — Ambulatory Visit (HOSPITAL_BASED_OUTPATIENT_CLINIC_OR_DEPARTMENT_OTHER)
Admission: RE | Admit: 2022-05-13 | Discharge: 2022-05-13 | Disposition: A | Discharge status: Home / Self Care | Payer: Medicare Other | Source: Ambulatory Visit | Attending: Cardiology | Admitting: Cardiology

## 2022-05-13 ENCOUNTER — Other Ambulatory Visit (HOSPITAL_COMMUNITY): Payer: Self-pay | Admitting: Emergency Medicine

## 2022-05-13 DIAGNOSIS — R931 Abnormal findings on diagnostic imaging of heart and coronary circulation: Secondary | ICD-10-CM

## 2022-05-13 DIAGNOSIS — R072 Precordial pain: Secondary | ICD-10-CM | POA: Diagnosis not present

## 2022-05-13 NOTE — Progress Notes (Signed)
I connected with  Dawn Perry on 05/13/22 by a audio enabled telemedicine application and verified that I am speaking with the correct person using two identifiers.  Patient Location: Home  Provider Location: Office/Clinic  I discussed the limitations of evaluation and management by telemedicine. The patient expressed understanding and agreed to proceed.   Subjective:   Dawn Perry is a 72 y.o. female who presents for Medicare Annual (Subsequent) preventive examination.      Patient Medicare AWV questionnaire was completed by the patient on 05/07/22; I have confirmed that all information answered by patient is correct and no changes since this date.    Review of Systems     Cardiac Risk Factors include: advanced age (>34men, >59 women);dyslipidemia     Objective:    Today's Vitals   05/11/22 1530  Weight: 144 lb (65.3 kg)   Body mass index is 25.51 kg/m.     05/11/2022    3:35 PM 02/06/2021    6:01 AM 01/24/2021    9:56 AM 10/09/2020    3:40 PM 10/01/2020    9:52 AM 01/10/2019    8:42 AM  Advanced Directives  Does Patient Have a Medical Advance Directive? Yes Yes Yes Yes Yes Yes  Type of Estate agent of [place];Living will  Healthcare Power of [place];Living will Living will;Out of facility DNR (pink MOST or yellow form) Healthcare Power of [place];Living will Living will;Healthcare Power of Attorney  Does patient want to make changes to medical advance directive?    No - Patient declined  No - Patient declined  Copy of Healthcare Power of Attorney in Chart? No - copy requested No - copy requested No - copy requested   No - copy requested    Current Medications (verified) Outpatient Encounter Medications as of 05/11/2022  Medication Sig   aspirin EC 81 MG tablet Take 81 mg by mouth every other day. Swallow whole.   Calcium Citrate-Vitamin D (CITRACAL + D PO) Take 1 tablet by mouth daily.   Multiple Vitamin (MULTI-VITAMIN DAILY) TABS  Take 1 tablet by mouth daily.   rosuvastatin (CRESTOR) 20 MG tablet Take 1 tablet (20 mg total) by mouth daily.   No facility-administered encounter medications on file as of 05/11/2022.    Allergies (verified) Patient has no known allergies.   History: Past Medical History:  Diagnosis Date   CAD in native artery 09/26/2020   Elevated blood pressure reading 09/26/2020   Family history of adverse reaction to anesthesia    mother had problems with N/V   GERD (gastroesophageal reflux disease)    B hands   H/O scarlet fever    age 22   Heart murmur    History of chicken pox    Hyperlipidemia    LBBB (left bundle branch block)    Osteoarthritis    PONV (postoperative nausea and vomiting)    Past Surgical History:  Procedure Laterality Date   Broke Left forearm     fracture left arm surgery      REVERSE SHOULDER ARTHROPLASTY Right 02/06/2021   Procedure: Right Reverse shoulder arthroplasty; hardware removal;  Surgeon: Francena Hanly, MD;  Location: [facility];  Service: Orthopedics;  Laterality: Right;  , screw removed   right shoulder surgery      Right shoulder tear  01/20/1988   TOTAL HIP ARTHROPLASTY Right 10/09/2020   Procedure: TOTAL HIP ARTHROPLASTY ANTERIOR APPROACH;  Surgeon: Ollen Gross, MD;  Location: [facility];  Service: Orthopedics;  Laterality: Right;  Family History  Problem Relation Age of Onset   Arthritis Father    Parkinson's disease Father    Hyperlipidemia Mother    Other Mother        abnormal EKG's   Arthritis Mother    Stroke Mother        hemorrhagic   Parkinsonism Other    Coronary artery disease Brother        30   Cancer Neg Hx    Social History   Socioeconomic History   Marital status: Married    Spouse name: Not on file   Number of children: 2   Years of education: Not on file   Highest education level: Not on file  Occupational History   Occupation: Real Psychologist, occupational  Tobacco Use   Smoking status: Never   Smokeless tobacco:  Never  Vaping Use   Vaping Use: Never used  Substance and Sexual Activity   Alcohol use: Never   Drug use: Never   Sexual activity: Yes    Partners: Male  Other Topics Concern   Not on file  Social History Narrative   1 small dog    Social Determinants of Health   Financial Resource Strain: Low Risk  (05/11/2022)   Overall Financial Resource Strain (CARDIA)    Difficulty of Paying Living Expenses: Not hard at all  Food Insecurity: No Food Insecurity (05/07/2022)   Hunger Vital Sign    Worried About Running Out of Food in the Last Year: Never true    Ran Out of Food in the Last Year: Never true  Transportation Needs: No Transportation Needs (05/07/2022)   PRAPARE - Administrator, Civil Service (Medical): No    Lack of Transportation (Non-Medical): No  Physical Activity: Insufficiently Active (05/07/2022)   Exercise Vital Sign    Days of Exercise per Week: 1 day    Minutes of Exercise per Session: 30 min  Stress: No Stress Concern Present (05/07/2022)   [employer] of Occupational Health - Occupational Stress Questionnaire    Feeling of Stress : Only a little  Social Connections: Moderately Isolated (05/07/2022)   Social Connection and Isolation Panel [NHANES]    Frequency of Communication with Friends and Family: More than three times a week    Frequency of Social Gatherings with Friends and Family: Once a week    Attends Religious Services: Never    Database administrator or Organizations: No    Attends Engineer, structural: Never    Marital Status: Married    Tobacco Counseling Counseling given: Not Answered   Clinical Intake:  Pre-visit preparation completed: Yes  Pain : No/denies pain     BMI - recorded: 25.51 Nutritional Status: BMI 25 -29 Overweight Nutritional Risks: None Diabetes: No  How often do you need to have someone help you when you read instructions, pamphlets, or other written materials from your doctor or pharmacy?: 1 -  Never  Diabetic?no  Interpreter Needed?: No  Information entered by :: Lanier Ensign, LPN   Activities of Daily Living    05/07/2022    8:57 AM  In your present state of health, do you have any difficulty performing the following activities:  Hearing? 0  Vision? 0  Difficulty concentrating or making decisions? 0  Walking or climbing stairs? 0  Dressing or bathing? 0  Doing errands, shopping? 0  Preparing Food and eating ? N  Using the Toilet? N  In the past six months, have you  accidently leaked urine? N  Do you have problems with loss of bowel control? N  Managing your Medications? N  Managing your Finances? N  Housekeeping or managing your Housekeeping? N    Patient Care Team: Ardith Dark, MD as PCP - General (Family Medicine) Chilton Si, MD as PCP - Cardiology (Cardiology) Laurey Morale, MD (Cardiology) Mateo Flow, MD (Ophthalmology) Cherlyn Roberts, MD (Dermatology)  Indicate any recent Medical Services you may have received from other than [facility] providers in the past year (date may be approximate).     Assessment:   This is a routine wellness examination for [place].  Hearing/Vision screen Hearing Screening - Comments:: Pt denies any hearing issues  Vision Screening - Comments:: Pt [facility] for [facility]   Dietary issues and exercise activities discussed: Current Exercise Habits: Home exercise routine, Type of exercise: walking, Time (Minutes): 30, Frequency (Times/Week): 1, Weekly Exercise (Minutes/Week): 30   Goals Addressed             This Visit's Progress    Patient Stated       Walk more       Depression Screen    05/11/2022    3:34 PM 12/22/2021   10:28 AM 01/10/2019    8:49 AM 09/07/2017    8:24 AM  PHQ 2/9 Scores  PHQ - 2 Score 0 0 0 0    Fall Risk    05/07/2022    8:57 AM 12/22/2021   10:28 AM 01/10/2019    8:49 AM 12/12/2018    4:27 PM 09/07/2017    8:24 AM  Fall Risk   Falls in the  past year? 0 0 0 0 No  Comment    Emmi Telephone Survey: data to providers prior to load   Number falls in past yr: 0 0     Injury with Fall? 0 0 0    Risk for fall due to : Impaired vision No Fall Risks     Follow up Falls prevention discussed  Falls evaluation completed;Education provided;Falls prevention discussed      FALL RISK PREVENTION PERTAINING TO THE HOME:  Any stairs in or around the home? Yes  If so, are there any without handrails? No  Home free of loose throw rugs in walkways, pet beds, electrical cords, etc? Yes  Adequate lighting in your home to reduce risk of falls? Yes   ASSISTIVE DEVICES UTILIZED TO PREVENT FALLS:  Life alert? No  Use of a cane, walker or w/c? No  Grab bars in the bathroom? Yes  Shower chair or bench in shower? Yes  Elevated toilet seat or a handicapped toilet? No   TIMED UP AND GO:  Was the test performed? No .   Cognitive Function:        05/11/2022    3:37 PM  6CIT Screen  What Year? 0 points  What month? 0 points  What time? 0 points  Count back from 20 0 points  Months in reverse 0 points  Repeat phrase 0 points  Total Score 0 points    Immunizations Immunization History  Administered Date(s) Administered   Fluad Quad(high Dose 65+) 11/08/2018, 12/29/2019   Influenza, High Dose Seasonal PF 11/10/2017   Influenza,inj,Quad PF,6+ Mos 11/01/2012   PFIZER(Purple Top)SARS-COV-2 Vaccination 02/09/2019, 03/02/2019, 10/26/2019   Pfizer Covid-19 Vaccine Bivalent Booster 66yrs & up 12/28/2020   Pneumococcal Conjugate-13 11/08/2018   Pneumococcal Polysaccharide-23 12/29/2019   Tdap 07/29/2011   Zoster, Live 07/29/2011  Flu Vaccine status: Declined, Education has been provided regarding the importance of this vaccine but patient still declined. Advised may receive this vaccine at local pharmacy or Health Dept. Aware to provide a copy of the vaccination record if obtained from local pharmacy or Health Dept. Verbalized acceptance  and understanding.  Pneumococcal vaccine status: Up to date  Covid-19 vaccine status: Completed vaccines  Qualifies for Shingles Vaccine? Yes   Zostavax completed No   Shingrix Completed?: No.    Education has been provided regarding the importance of this vaccine. Patient has been advised to call insurance company to determine out of pocket expense if they have not yet received this vaccine. Advised may also receive vaccine at local pharmacy or Health Dept. Verbalized acceptance and understanding.  Screening Tests Health Maintenance  Topic Date Due   Zoster Vaccines- Shingrix (1 of 2) Never done   DEXA SCAN  04/23/2021   MAMMOGRAM  12/23/2022 (Originally 10/14/2019)   Hepatitis C Screening  12/23/2022 (Originally 03/16/1968)   COVID-19 Vaccine (5 - 2023-24 season) 01/08/2023 (Originally 09/19/2021)   INFLUENZA VACCINE  08/20/2022   Medicare Annual Wellness (AWV)  05/11/2023   Fecal DNA (Cologuard)  03/09/2025   Pneumonia Vaccine 54+ Years old  Completed   HPV VACCINES  Aged Out   DTaP/Tdap/Td  Discontinued    Health Maintenance  Health Maintenance Due  Topic Date Due   Zoster Vaccines- Shingrix (1 of 2) Never done   DEXA SCAN  04/23/2021    Colorectal cancer screening: Type of screening: Cologuard. Completed 03/09/22. Repeat every 3 years  Mammogram status: Ordered 05/11/22. Pt provided with contact info and advised to call to schedule appt.   Bone Density status: Ordered 05/11/22. Pt provided with contact info and advised to call to schedule appt.   Additional Screening:  Hepatitis C Screening: does qualify;  Vision Screening: Recommended annual ophthalmology exams for early detection of glaucoma and other disorders of the eye. Is the patient up to date with their annual eye exam?  Yes  Who is the provider or what is the name of the office in which the patient attends annual eye exams? [facility]  If pt is not established with a provider, would they like to be referred to a  provider to establish care? No .   Dental Screening: Recommended annual dental exams for proper oral hygiene  Community Resource Referral / Chronic Care Management: CRR required this visit?  No   CCM required this visit?  No      Plan:     I have personally reviewed and noted the following in the patient's chart:   Medical and social history Use of alcohol, tobacco or illicit drugs  Current medications and supplements including opioid prescriptions. Patient is not currently taking opioid prescriptions. Functional ability and status Nutritional status Physical activity Advanced directives List of other physicians Hospitalizations, surgeries, and ER visits in previous 12 months Vitals Screenings to include cognitive, depression, and falls Referrals and appointments  In addition, I have reviewed and discussed with patient certain preventive protocols, quality metrics, and best practice recommendations. A written personalized care plan for preventive services as well as general preventive health recommendations were provided to patient.     Marzella Schlein, LPN   [id number]/61/0960   Nurse Notes: none

## 2022-05-14 ENCOUNTER — Other Ambulatory Visit: Payer: Self-pay | Admitting: Family Medicine

## 2022-05-14 DIAGNOSIS — E2839 Other primary ovarian failure: Secondary | ICD-10-CM

## 2022-05-18 ENCOUNTER — Other Ambulatory Visit: Payer: Self-pay | Admitting: Family Medicine

## 2022-05-18 DIAGNOSIS — Z1231 Encounter for screening mammogram for malignant neoplasm of breast: Secondary | ICD-10-CM

## 2022-06-09 ENCOUNTER — Encounter (HOSPITAL_BASED_OUTPATIENT_CLINIC_OR_DEPARTMENT_OTHER): Payer: Self-pay | Admitting: Family

## 2022-06-09 ENCOUNTER — Ambulatory Visit (INDEPENDENT_AMBULATORY_CARE_PROVIDER_SITE_OTHER): Payer: Medicare Other | Admitting: Family

## 2022-06-09 VITALS — BP 134/82 | HR 61 | Ht 63.0 in | Wt 143.0 lb

## 2022-06-09 DIAGNOSIS — R0609 Other forms of dyspnea: Secondary | ICD-10-CM

## 2022-06-09 DIAGNOSIS — E785 Hyperlipidemia, unspecified: Secondary | ICD-10-CM | POA: Diagnosis not present

## 2022-06-09 DIAGNOSIS — R053 Chronic cough: Secondary | ICD-10-CM

## 2022-06-09 DIAGNOSIS — I25118 Atherosclerotic heart disease of native coronary artery with other forms of angina pectoris: Secondary | ICD-10-CM | POA: Diagnosis not present

## 2022-06-09 NOTE — Progress Notes (Signed)
Office Visit    Patient Name: Dawn Perry Date of Encounter: 06/09/2022  PCP:  Ardith Dark, MD   [facility]  Cardiologist:  Chilton Si, MD  Advanced Practice Provider:  No care team member to display Electrophysiologist:  None    Chief Complaint    Dawn Perry is a 72 y.o. female presents today for follow up after cardiac CTA  Past Medical History    Past Medical History:  Diagnosis Date   CAD in native artery 09/26/2020   Elevated blood pressure reading 09/26/2020   Family history of adverse reaction to anesthesia    mother had problems with N/V   GERD (gastroesophageal reflux disease)    B hands   H/O scarlet fever    age 9   Heart murmur    History of chicken pox    Hyperlipidemia    LBBB (left bundle branch block)    Osteoarthritis    PONV (postoperative nausea and vomiting)    Past Surgical History:  Procedure Laterality Date   Broke Left forearm     fracture left arm surgery      REVERSE SHOULDER ARTHROPLASTY Right 02/06/2021   Procedure: Right Reverse shoulder arthroplasty; hardware removal;  Surgeon: Francena Hanly, MD;  Location: [facility];  Service: Orthopedics;  Laterality: Right;  , screw removed   right shoulder surgery      Right shoulder tear  01/20/1988   TOTAL HIP ARTHROPLASTY Right 10/09/2020   Procedure: TOTAL HIP ARTHROPLASTY ANTERIOR APPROACH;  Surgeon: Ollen Gross, MD;  Location: [facility];  Service: Orthopedics;  Laterality: Right;    Allergies  No Known Allergies  History of Present Illness    Dawn Perry is a 72 y.o. female with a hx of nonobstructive CAD, hyperlipidemia, left bundle branch block last seen 05/01/22  Prior patient of Norma Fredrickson, NP.  Prior Myoview 2004 at time of initial diagnosis LBBB negative for ischemia.  Echo 2012 LVEF 55%, mild MR. Coronary CT 11/2017 consistent with nonobstructive three-vessel coronary disease with negative FFR.  Calcium score 357 placing  her in the 92nd percentile.  Last seen 07/29/2021 by Dr. Duke Salvia.  BP in clinic and at home routinely 120s over 80s.  She noted occasional tingling sensation along jawline of both sides which is difficult to describe but no exertional symptoms.  Seen 05/01/2022 noting exertional dyspnea for which cardiac CTA ordered. Lab 04/29/2022 with LDL 69.  Normal lipoprotein a. Cardiac CTA 05/12/2022 with coronary calcium score 694 placing her in the 93rd for percentile for age and gender.  Report revealed ostial LAD 25-49%, mid LAD 1-24%, proximal circumflex 1-24%, proximal RCA 25 to 49%, mid RCA 25-49% stenosis FFR with no hemodynamically significant stenosis.  Presents today for follow up independently.  Reviewed cardiac CTA in detail as well as secondary prevention of coronary artery disease.  Last weekend was in [place] and noted if hse walked the length of a soccer field at a decent pace she gets a "weird sensation up her neck and jaw which has been ongoing for some time. It was also associated with some dyspnea. Has not been checking BP routinely at home.   EKGs/Labs/Other Studies Reviewed:   The following studies were reviewed today:  Cardiac Studies & Procedures          CT SCANS  CT CORONARY MORPH W/CTA COR W/SCORE 05/16/2022  Addendum 05/16/2022  4:47 PM ADDENDUM REPORT: 05/16/2022 16:44  EXAM: OVER-READ INTERPRETATION  CT CHEST  The following report is an over-read performed by radiologist Dr. Narda Rutherford of [facility] on 05/16/2022. This over-read does not include interpretation of cardiac or coronary anatomy or pathology. The coronary CTA interpretation by the cardiologist is attached.  COMPARISON:  11/29/2017  FINDINGS: Vascular: No aortic atherosclerosis. The included aorta is normal in caliber.  Mediastinum/nodes: No adenopathy or mass. Unremarkable esophagus.  Lungs: No focal airspace disease. Unchanged 3 mm left lower lobe nodule series 11, image 7,  stable since 2019. Previous right lower lobe pulmonary nodule has diminished in size. No pleural fluid. The included airways are patent.  Upper abdomen: No acute or unexpected findings.  Musculoskeletal: There are no acute or suspicious osseous abnormalities. Thoracic spondylosis with anterior spurring.  IMPRESSION: Small pulmonary nodules are stable or decreased in size dating back to 2019 consistent with benign etiology. No further imaging follow-up is needed.   Electronically Signed By: Narda Rutherford M.D. On: 05/16/2022 16:44  Narrative CLINICAL DATA:  Chest pain  EXAM: Cardiac CTA  MEDICATIONS: Sub lingual nitro. 4mg  x 2  TECHNIQUE: The patient was scanned on a Siemens 192 slice scanner. Gantry rotation speed was 250 msecs. Collimation was 0.6 mm. A 100 kV prospective scan was triggered in the ascending thoracic aorta at 35-75% of the R-R interval. Average HR during the scan was 60 bpm. The 3D data set was interpreted on a dedicated work station using MPR, MIP and VRT modes. A total of 80cc of contrast was used.  FINDINGS: Non-cardiac: See separate report from [facility].  No LA appendage thrombus. Pulmonary veins drain normally to the left atrium.  Calcium Score: 694 Agatston units.  Coronary Arteries: Right dominant with no anomalies  LM: No plaque or stenosis.  LAD system: Mixed plaque ostial LAD, mild (25-49%) stenosis. Calcified plaque mid LAD, mild (1-24%) stenosis.  Circumflex system: Calcified plaque proximal LCx, mild (1-24%) stenosis.  RCA system: Calcified plaque proximal RCA, mild (25-49%) stenosis. Noncalcified plaque mid RCA, mild (25-49%) stenosis.  IMPRESSION: 1. Coronary artery calcium score 694 Agatston units. This places the patient in the 93rd percentile for age and gender.  2. Nonobstructive plaque in the coronaries. FFR analysis suggests no hemodynamically significant stenosis.  [employer] account executive  Electronically  Signed: By: Marca Ancona M.D. On: 05/12/2022 21:28   CT SCANS  CT CORONARY MORPH W/CTA COR W/SCORE 11/29/2017  Addendum 11/29/2017  5:31 PM ADDENDUM REPORT: 11/29/2017 17:28  CLINICAL DATA:  Chest pain  EXAM: Cardiac CTA  MEDICATIONS: Sub lingual nitro. 4mg  and lopressor 5mg   TECHNIQUE: The patient was scanned on a [employer] 192 scanner. Gantry rotation speed was 250 msecs. Collimation was. 6 mm . A 120 kV prospective scan was triggered in the ascending thoracic aorta at 140 HU's with full mA between 30-70% of the R-R interval . Average HR during the scan was 61 bpm. The 3D data set was interpreted on a dedicated work station using MPR, MIP and VRT modes. A total of 80cc of contrast was used.  FINDINGS: Non-cardiac: See separate report from [facility]. No significant findings on limited lung and soft tissue windows.  Calcium score: Calcium noted in all 3 major epicardial coronary vessels  Coronary Arteries: Right dominant with no anomalies  LM: Normal  LAD: 50% proximal calcific disease includes ostium of D1 less than 50% mid vessel disease  D1: Normal  D2: Normal  D3: Normal  Circumflex: Less than 50% calcific proximal and mid vessel disease  OM1: Normal  AV groove :  Normal  RCA: Less than 50% proximal and mid vessel disease  PDA: Normal  PLA: Normal  IMPRESSION: 1. Likely non obstructive 3 vessel CAD. Proximal LAD seems worst will be sent for FFR CT  2.  Calcium score 357 which is 92 nd percentile for age and sex  3.  Normal aortic root 2.8 cm  Charlton Haws   Electronically Signed By: Charlton Haws M.D. On: 11/29/2017 17:28  Narrative EXAM: OVER-READ INTERPRETATION  CT CHEST  The following report is an over-read performed by radiologist Dr. Charlett Nose of [facility] on 11/29/2017. This over-read does not include interpretation of cardiac or coronary anatomy or pathology. The coronary CTA interpretation  by the cardiologist is attached.  COMPARISON:  11/10/2017  FINDINGS: Vascular: Heart is borderline in size. Visualized aorta is normal caliber.  Mediastinum/Nodes: No adenopathy in the lower mediastinum or hila.  Lungs/Pleura: Small scattered pulmonary nodules are again noted, 5 mm or less in size as described on prior study. No effusions.  Upper Abdomen: Imaging into the upper abdomen shows no acute findings.  Musculoskeletal: Chest wall soft tissues are unremarkable. No acute bony abnormality.  IMPRESSION: Small pulmonary nodules, 5 mm or less in size. No follow-up needed if patient is low-risk (and has no known or suspected primary neoplasm). Non-contrast chest CT can be considered in 12 months if patient is high-risk. This recommendation follows the consensus statement: Guidelines for Management of Incidental Pulmonary Nodules Detected on CT Images: From the Fleischner Society 2017; Radiology 2017; 284:228-243.  Electronically Signed: By: Charlett Nose M.D. On: 11/29/2017 16:37   CT SCANS  CT CARDIAC SCORING (SELF PAY ONLY) 11/10/2017  Addendum 11/10/2017  4:49 PM ADDENDUM REPORT: 11/10/2017 16:47  EXAM: OVER-READ INTERPRETATION  CT CHEST  The following report is an over-read performed by radiologist Dr. Alver Fisher [facility] on 11/10/2017. This over-read does not include interpretation of cardiac or coronary anatomy or pathology. The interpretation by the cardiologist is attached.  COMPARISON:  None.  FINDINGS: Few pulmonary nodules measure up to 5 mm in the right lower lobe. No pleural fluid. Visualized upper abdomen is unremarkable. Visualized osseous structures show degenerative changes in the spine. (Series 3, image 25).  IMPRESSION: Pulmonary nodules measure 5 mm or less in size. No follow-up needed if patient is low-risk (and has no known or suspected primary neoplasm). Non-contrast chest CT can be considered in 12 months  if patient is high-risk. This recommendation follows the consensus statement: Guidelines for Management of Incidental Pulmonary Nodules Detected on CT Images: From the Fleischner Society 2017; Radiology 2017; 284:228-243.   Electronically Signed By: Leanna Battles M.D. On: 11/10/2017 16:47  Narrative CLINICAL DATA:  Risk stratification  EXAM: Coronary Calcium Score  MEDICATIONS: None.  TECHNIQUE: The patient was scanned on a [employer]. Axial non-contrast 3 mm slices were carried out through the heart. The data set was analyzed on a dedicated work station and scored using the Agatson method.  FINDINGS: Non-cardiac: See separate report from [facility].  Ascending Aorta: Normal size, trivial calcifications.  Pericardium: Normal, mild focal calcifications.  Coronary arteries: Normal origin.  IMPRESSION: Coronary calcium score of 259. This was 73 percentile for age and sex matched control.  Tobias Alexander  Electronically Signed: By: Tobias Alexander On: 11/10/2017 12:54           EKG:  EKG is not ordered today.    Recent Labs: 04/29/2022: ALT 28; BUN 19; Creatinine, Ser 0.84; Potassium 4.8; Sodium 142  Recent Lipid Panel    Component Value Date/Time   CHOL 149 04/29/2022 0911   TRIG 170 (H) 04/29/2022 0911   HDL 51 04/29/2022 0911   CHOLHDL 2.9 04/29/2022 0911   CHOLHDL 5.0 10/18/2015 0924   VLDL 31 (H) 10/18/2015 0924   LDLCALC 69 04/29/2022 0911   LDLDIRECT 132.0 02/15/2013 1006    Home Medications   Current Meds  Medication Sig   aspirin EC 81 MG tablet Take 81 mg by mouth every other day. Swallow whole.   Calcium Citrate-Vitamin D (CITRACAL + D PO) Take 1 tablet by mouth daily.   Multiple Vitamin (MULTI-VITAMIN DAILY) TABS Take 1 tablet by mouth daily.   rosuvastatin (CRESTOR) 20 MG tablet Take 1 tablet (20 mg total) by mouth daily.     Review of Systems      All other systems reviewed and are otherwise negative except  as noted above.  Physical Exam    VS:  BP 134/82   Pulse 61   Ht 5\' 3"  (1.6 m)   Wt 143 lb (64.9 kg)   BMI 25.33 kg/m  , BMI Body mass index is 25.33 kg/m.  Wt Readings from Last 3 Encounters:  06/09/22 143 lb (64.9 kg)  05/11/22 144 lb (65.3 kg)  05/01/22 144 lb (65.3 kg)    GEN: Well nourished, well developed, in no acute distress. HEENT: normal. Neck: Supple, no JVD, carotid bruits, or masses. Cardiac: RRR, no murmurs, rubs, or gallops. No clubbing, cyanosis, edema.  Radials/PT 2+ and equal bilaterally.  Respiratory:  Respirations regular and unlabored, clear to auscultation bilaterally. GI: Soft, nontender, nondistended. MS: No deformity or atrophy. Skin: Warm and dry, no rash. Neuro:  Strength and sensation are intact. Psych: Normal affect.  Assessment & Plan    Nonobstructive CAD -cardiac CTA 05/12/2022 calcium score of 694 with overall nonobstructive plaque with FFR with no hemodynamically significant stenosis.  Exertional dyspnea not related to atherosclerosis.  Discussed possible addition of metoprolol for optimization of GDMT but she prefers to avoid additional medications if possible.  GDMT aspirin, rosuvastatin. Heart healthy diet and regular cardiovascular exercise encouraged.    Cough - Ongoing for some time. Started after COVID of August last year. Will refer to pulmonology given persistent cough post COVID and exertional dyspnea with unremarkable cardiac CTA. No edema suggestive of HF.   HLD, LDL goal less than 70-04/20/2022 LP(a) 21.1, CMP unremarkable, LDL 69.  Continue rosuvastatin 20 mg daily.    Previously elevated blood pressure reading-initial BP in clinic 150/86 with repeat 134/82 without intervention.  She will monitor at home for 2 weeks and report if persistently greater than 130/80.  Will not plan to add hypertension medication at this time but could continue amlodipine in future if blood pressure remains elevated.       Disposition: Follow up in 6  week(s) with Chilton Si, MD or APP.  Signed, Alver Sorrow, NP 06/09/2022, 4:58 PM [facility]

## 2022-06-09 NOTE — Patient Instructions (Signed)
Medication Instructions:  Your physician recommends that you continue on your current medications as directed. Please refer to the Current Medication list given to you today.  *If you need a refill on your cardiac medications before your next appointment, please call your pharmacy*   Follow-Up: At [facility], you and your health needs are our priority.  As part of our continuing mission to provide you with exceptional heart care, we have created designated Provider Care Teams.  These Care Teams include your primary Cardiologist (physician) and Advanced Practice Providers (APPs -  Physician Assistants and Nurse Practitioners) who all work together to provide you with the care you need, when you need it.  We recommend signing up for the patient portal called "MyChart".  Sign up information is provided on this After Visit Summary.  MyChart is used to connect with patients for Virtual Visits (Telemedicine).  Patients are able to view lab/test results, encounter notes, upcoming appointments, etc.  Non-urgent messages can be sent to your provider as well.   To learn more about what you can do with MyChart, go to [email or web address].    Your next appointment:   6 month(s)  Provider:   Chilton Si, MD    Other Instructions We have referred you to pulmonology

## 2022-07-02 ENCOUNTER — Encounter (HOSPITAL_BASED_OUTPATIENT_CLINIC_OR_DEPARTMENT_OTHER): Payer: Self-pay

## 2022-07-02 ENCOUNTER — Institutional Professional Consult (permissible substitution) (HOSPITAL_BASED_OUTPATIENT_CLINIC_OR_DEPARTMENT_OTHER): Payer: Medicare Other | Admitting: Pulmonary Disease

## 2022-07-06 ENCOUNTER — Encounter: Payer: Self-pay | Admitting: Pulmonary Disease

## 2022-11-04 ENCOUNTER — Encounter: Payer: Self-pay | Admitting: Family Medicine

## 2022-11-04 ENCOUNTER — Ambulatory Visit (INDEPENDENT_AMBULATORY_CARE_PROVIDER_SITE_OTHER): Payer: Medicare Other | Admitting: Family Medicine

## 2022-11-04 VITALS — BP 126/72 | HR 66 | Temp 97.7°F | Ht 63.0 in | Wt 142.2 lb

## 2022-11-04 DIAGNOSIS — M1990 Unspecified osteoarthritis, unspecified site: Secondary | ICD-10-CM

## 2022-11-04 DIAGNOSIS — E7800 Pure hypercholesterolemia, unspecified: Secondary | ICD-10-CM | POA: Diagnosis not present

## 2022-11-04 NOTE — Assessment & Plan Note (Signed)
Pain in right knee consistent with osteoarthritis.  May have been degenerative meniscal tear as well given her mechanical symptoms as well.  We discussed conservative treatment options including compression, ice, and anti-inflammatories.  She would like to try over-the-counter Voltaren for any prescription oral NSAIDs at this point.  We also discussed home exercises and handout was given.  She has had good improvement over the last week or so and anticipate this will continue to improve over the next several weeks.  We did discuss referral to physical therapy or orthopedics/sports medicine however she deferred.  She will let us know if not improving in the next couple weeks and would consider imaging versus referral at that time.

## 2022-11-04 NOTE — Patient Instructions (Addendum)
It was very nice to see you today!  I think you probably have some arthritis in your knee.  You may have a small meniscal tear as well.  Please continue using ice to the area.  You can use a topical anti-inflammatory.  Work on the exercises.  Let us know if not improving.  Please come back soon for your annual physical.  Return if symptoms worsen or fail to improve.   Take care, Dr Jimmey Ralph  PLEASE NOTE:  If you had any lab tests, please let us know if you have not heard back within a few days. You may see your results on mychart before we have a chance to review them but we will give you a call once they are reviewed by [place].   If we ordered any referrals today, please let us know if you have not heard from their office within the next week.   If you had any urgent prescriptions sent in today, please check with the pharmacy within an hour of our visit to make sure the prescription was transmitted appropriately.   Please try these tips to maintain a healthy lifestyle:  Eat at least 3 REAL meals and 1-2 snacks per day.  Aim for no more than 5 hours between eating.  If you eat breakfast, please do so within one hour of getting up.   Each meal should contain half fruits/vegetables, one quarter protein, and one quarter carbs (no bigger than a computer mouse)  Cut down on sweet beverages. This includes juice, soda, and sweet tea.   Drink at least 1 glass of water with each meal and aim for at least 8 glasses per day  Exercise at least 150 minutes every week.

## 2022-11-04 NOTE — Assessment & Plan Note (Signed)
On Crestor 20 mg daily.  Advised her to come back soon for CPE with labs.

## 2022-11-04 NOTE — Progress Notes (Signed)
   Dawn Perry is a 72 y.o. female who presents today for an office visit.  Assessment/Plan:  Chronic Problems Addressed Today: Osteoarthritis Pain in right knee consistent with osteoarthritis.  May have been degenerative meniscal tear as well given her mechanical symptoms as well.  We discussed conservative treatment options including compression, ice, and anti-inflammatories.  She would like to try over-the-counter Voltaren for any prescription oral NSAIDs at this point.  We also discussed home exercises and handout was given.  She has had good improvement over the last week or so and anticipate this will continue to improve over the next several weeks.  We did discuss referral to physical therapy or orthopedics/sports medicine however she deferred.  She will let us know if not improving in the next couple weeks and would consider imaging versus referral at that time.  Hyperlipidemia On Crestor 20 mg daily.  Advised her to come back soon for CPE with labs.     Subjective:  HPI:  See A/P for status of chronic conditions.  Patient is here with right knee pain and limited range of motion for the past week or so.  No obvious injuries or precipitating events.  She is taking ibuprofen which helps.  Occasionally pops and locks.  Symptoms have improved over the last week or so.       Objective:  Physical Exam: BP 126/72   Pulse 66   Temp 97.7 F (36.5 C) (Temporal)   Ht 5\' 3"  (1.6 m)   Wt 142 lb 3.2 oz (64.5 kg)   SpO2 100%   BMI 25.19 kg/m   Gen: No acute distress, resting comfortably MUSCULOSKELETAL - Knee: Gross effusion noted bilateral knees right worse than left.   - - Right knee with limited range of motion.  Tenderness palpation along medial joint line.  Stable to varus and valgus stress.  McMurry test negative.  Drawer signs negative. - - Left Knee: Gross effusion as above.  Crepitus with passive and active range of motion. Neuro: Grossly normal, moves all  extremities Psych: Normal affect and thought content      Brinden Kincheloe M. Jimmey Ralph, MD 11/04/2022 9:07 AM

## 2022-11-27 ENCOUNTER — Ambulatory Visit: Payer: Medicare Other

## 2022-11-27 ENCOUNTER — Other Ambulatory Visit: Payer: Medicare Other

## 2022-12-01 DIAGNOSIS — M1711 Unilateral primary osteoarthritis, right knee: Secondary | ICD-10-CM | POA: Diagnosis not present

## 2022-12-25 ENCOUNTER — Ambulatory Visit (HOSPITAL_BASED_OUTPATIENT_CLINIC_OR_DEPARTMENT_OTHER): Payer: Medicare Other | Admitting: Cardiovascular Disease

## 2022-12-25 ENCOUNTER — Encounter (HOSPITAL_BASED_OUTPATIENT_CLINIC_OR_DEPARTMENT_OTHER): Payer: Self-pay | Admitting: Cardiovascular Disease

## 2022-12-25 VITALS — BP 116/70 | HR 59 | Ht 63.0 in | Wt 142.4 lb

## 2022-12-25 DIAGNOSIS — I2510 Atherosclerotic heart disease of native coronary artery without angina pectoris: Secondary | ICD-10-CM | POA: Diagnosis not present

## 2022-12-25 DIAGNOSIS — E7800 Pure hypercholesterolemia, unspecified: Secondary | ICD-10-CM

## 2022-12-25 DIAGNOSIS — Z5181 Encounter for therapeutic drug level monitoring: Secondary | ICD-10-CM

## 2022-12-25 DIAGNOSIS — I447 Left bundle-branch block, unspecified: Secondary | ICD-10-CM | POA: Diagnosis not present

## 2022-12-25 NOTE — Patient Instructions (Signed)
Medication Instructions:  Your physician recommends that you continue on your current medications as directed. Please refer to the Current Medication list given to you today.   *If you need a refill on your cardiac medications before your next appointment, please call your pharmacy*  Lab Work: FASTING LP/CMET SOON   If you have labs (blood work) drawn today and your tests are completely normal, you will receive your results only by: MyChart Message (if you have MyChart) OR A paper copy in the mail If you have any lab test that is abnormal or we need to change your treatment, we will call you to review the results.  Tsting/Procedures: NONE  Follow-Up: At [facility], you and your health needs are our priority.  As part of our continuing mission to provide you with exceptional heart care, we have created designated Provider Care Teams.  These Care Teams include your primary Cardiologist (physician) and Advanced Practice Providers (APPs -  Physician Assistants and Nurse Practitioners) who all work together to provide you with the care you need, when you need it.  We recommend signing up for the patient portal called "MyChart".  Sign up information is provided on this After Visit Summary.  MyChart is used to connect with patients for Virtual Visits (Telemedicine).  Patients are able to view lab/test results, encounter notes, upcoming appointments, etc.  Non-urgent messages can be sent to your provider as well.   To learn more about what you can do with MyChart, go to [email or web address].    Your next appointment:   12 month(s)  Provider:   Chilton Si, MD or Gillian Shields, NP

## 2022-12-25 NOTE — Progress Notes (Signed)
Cardiology Office Note:  .   Date:  12/25/2022  ID:  Dawn Perry, DOB 04-30-1950, MRN [id number] PCP: Ardith Dark, MD  [facility] Providers Cardiologist:  Chilton Si, MD    History of Present Illness: .   Dawn Perry is a 72 y.o. female with nonobstructive CAD, hyperlipidemia, and LBBB who presents for follow up.  She was previosly a patient of Dawn Fredrickson, Dawn Perry.  She had a nuclear stress test in 2004 at the time when she was first diagnosed with left bundle branch block.  This was negative for ischemia.  She had an echo in 2012 that revealed LVEF 55% and mild mitral vegetation.  She has a history of vasovagal presyncope with blood draws.  She last saw Dawn Perry on 09/2019 and was doing well.  She had a coronary CT 11/2017 consistent with nonobstructive three-vessel CAD.  FFR was negative.  Her calcium score was 357 which was 92nd percentile.  Lp(a) was 21.1.   At her appointment 09/2020 she was doing well but blood pressure was mildly elevated. She wanted to work on diet and exercise.  At her visit 07/2021 BP was better controlled.  She saw Dawn Shields, Dawn Perry last Perry and reported atypical neck pain.  They discussed adding metoprolol and she declined.  She was referred to pulmonology for chronic cough.  Dawn Perry presents with a chief complaint of knee pain. The knee pain, which began in August, is described as 'tight and crunchy,' suggestive of arthritis and a possible meniscus tear.  She has been less active due to the knee pain, which has led to a noticeable increase in breathlessness, particularly when walking uphill.  She also reports a change in aspirin intake, taking it every other day instead of daily. Her diet has been generally healthy, with a recent exception for Thanksgiving. She has been considering water activities and using a recumbent bike at home to stay active without exacerbating the knee pain.  Dawn Perry denies any problems with swelling in the  lower legs and has not experienced any significant changes in breathing, aside from the increased breathlessness with exertion. Her blood pressure is well-controlled without medication, and cholesterol levels were last checked in April.     ROS:  As per HPI  Studies Reviewed: Marland Kitchen   EKG Interpretation Date/Time:  Friday December 25 2022 10:04:12 EST Ventricular Rate:  59 PR Interval:  180 QRS Duration:  132 QT Interval:  442 QTC Calculation: 437 R Axis:   -47  Text Interpretation: Sinus bradycardia Left axis deviation Left bundle branch block No previous ECGs available Confirmed by Chilton Si ([id number]) on 12/25/2022 10:19:39 AM     Risk Assessment/Calculations:             Physical Exam:   VS:  BP 116/70   Pulse (!) 59   Ht 5\' 3"  (1.6 m)   Wt 142 lb 6.4 oz (64.6 kg)   SpO2 99%   BMI 25.23 kg/m  , BMI Body mass index is 25.23 kg/m. GENERAL:  Well appearing HEENT: Pupils equal round and reactive, fundi not visualized, oral mucosa unremarkable NECK:  No jugular venous distention, waveform within normal limits, carotid upstroke brisk and symmetric, no bruits, no thyromegaly LUNGS:  Clear to auscultation bilaterally HEART:  RRR.  PMI not displaced or sustained,S1 and S2 within normal limits, no S3, no S4, no clicks, no rubs, no murmurs ABD:  Flat, positive bowel sounds normal in frequency in pitch, no bruits, no  rebound, no guarding, no midline pulsatile mass, no hepatomegaly, no splenomegaly EXT:  2 plus pulses throughout, no edema, no cyanosis no clubbing SKIN:  No rashes no nodules NEURO:  Cranial nerves II through XII grossly intact, motor grossly intact throughout PSYCH:  Cognitively intact, oriented to person place and time   ASSESSMENT AND PLAN: .    # Knee Pain Likely secondary to arthritis and possible meniscus tear. Pain and swelling have improved but still present. Limited in physical activity due to knee pain. -Encouraged to resume low-impact exercises such as  recumbent biking and water activities.  # Shortness of Breath Noted on exertion, particularly uphill. Possible deconditioning due to decreased physical activity secondary to knee pain. No change in imaging from less than a year ago. -Encouraged to resume low-impact exercises and monitor for any changes in shortness of breath.  # Coronary Artery Disease CT-A revealed mild blockage in the left anterior descending and right coronary arteries. LDL under 70 as of last check in April. -Continue daily aspirin. -Check fasting lipid panel.  LDL goal <70. -Follow-up in 1 year or sooner if symptoms change.       Signed, Chilton Si, MD

## 2022-12-31 ENCOUNTER — Ambulatory Visit
Admission: RE | Admit: 2022-12-31 | Discharge: 2022-12-31 | Disposition: A | Discharge status: Home / Self Care | Payer: Medicare Other | Source: Ambulatory Visit | Attending: Family Medicine | Admitting: Family Medicine

## 2022-12-31 DIAGNOSIS — M8588 Other specified disorders of bone density and structure, other site: Secondary | ICD-10-CM | POA: Diagnosis not present

## 2022-12-31 DIAGNOSIS — N958 Other specified menopausal and perimenopausal disorders: Secondary | ICD-10-CM | POA: Diagnosis not present

## 2022-12-31 DIAGNOSIS — Z1231 Encounter for screening mammogram for malignant neoplasm of breast: Secondary | ICD-10-CM

## 2022-12-31 DIAGNOSIS — E2839 Other primary ovarian failure: Secondary | ICD-10-CM

## 2023-01-04 NOTE — Progress Notes (Signed)
Her bone density scan shows mild thinning of the bone called osteopenia.  This is not osteoporosis but can progress to this.  She should make sure that she is optimizing calcium and vitamin D supplementation.  We can repeat in 2 years.

## 2023-02-20 IMAGING — DX DG PORTABLE PELVIS
1 series · 1 of 1 positions shown · non-contrast
Comparison: None.

CLINICAL DATA: Status post right total hip splint.

EXAM:
PORTABLE PELVIS 1-2 VIEWS

[pelvis ap]
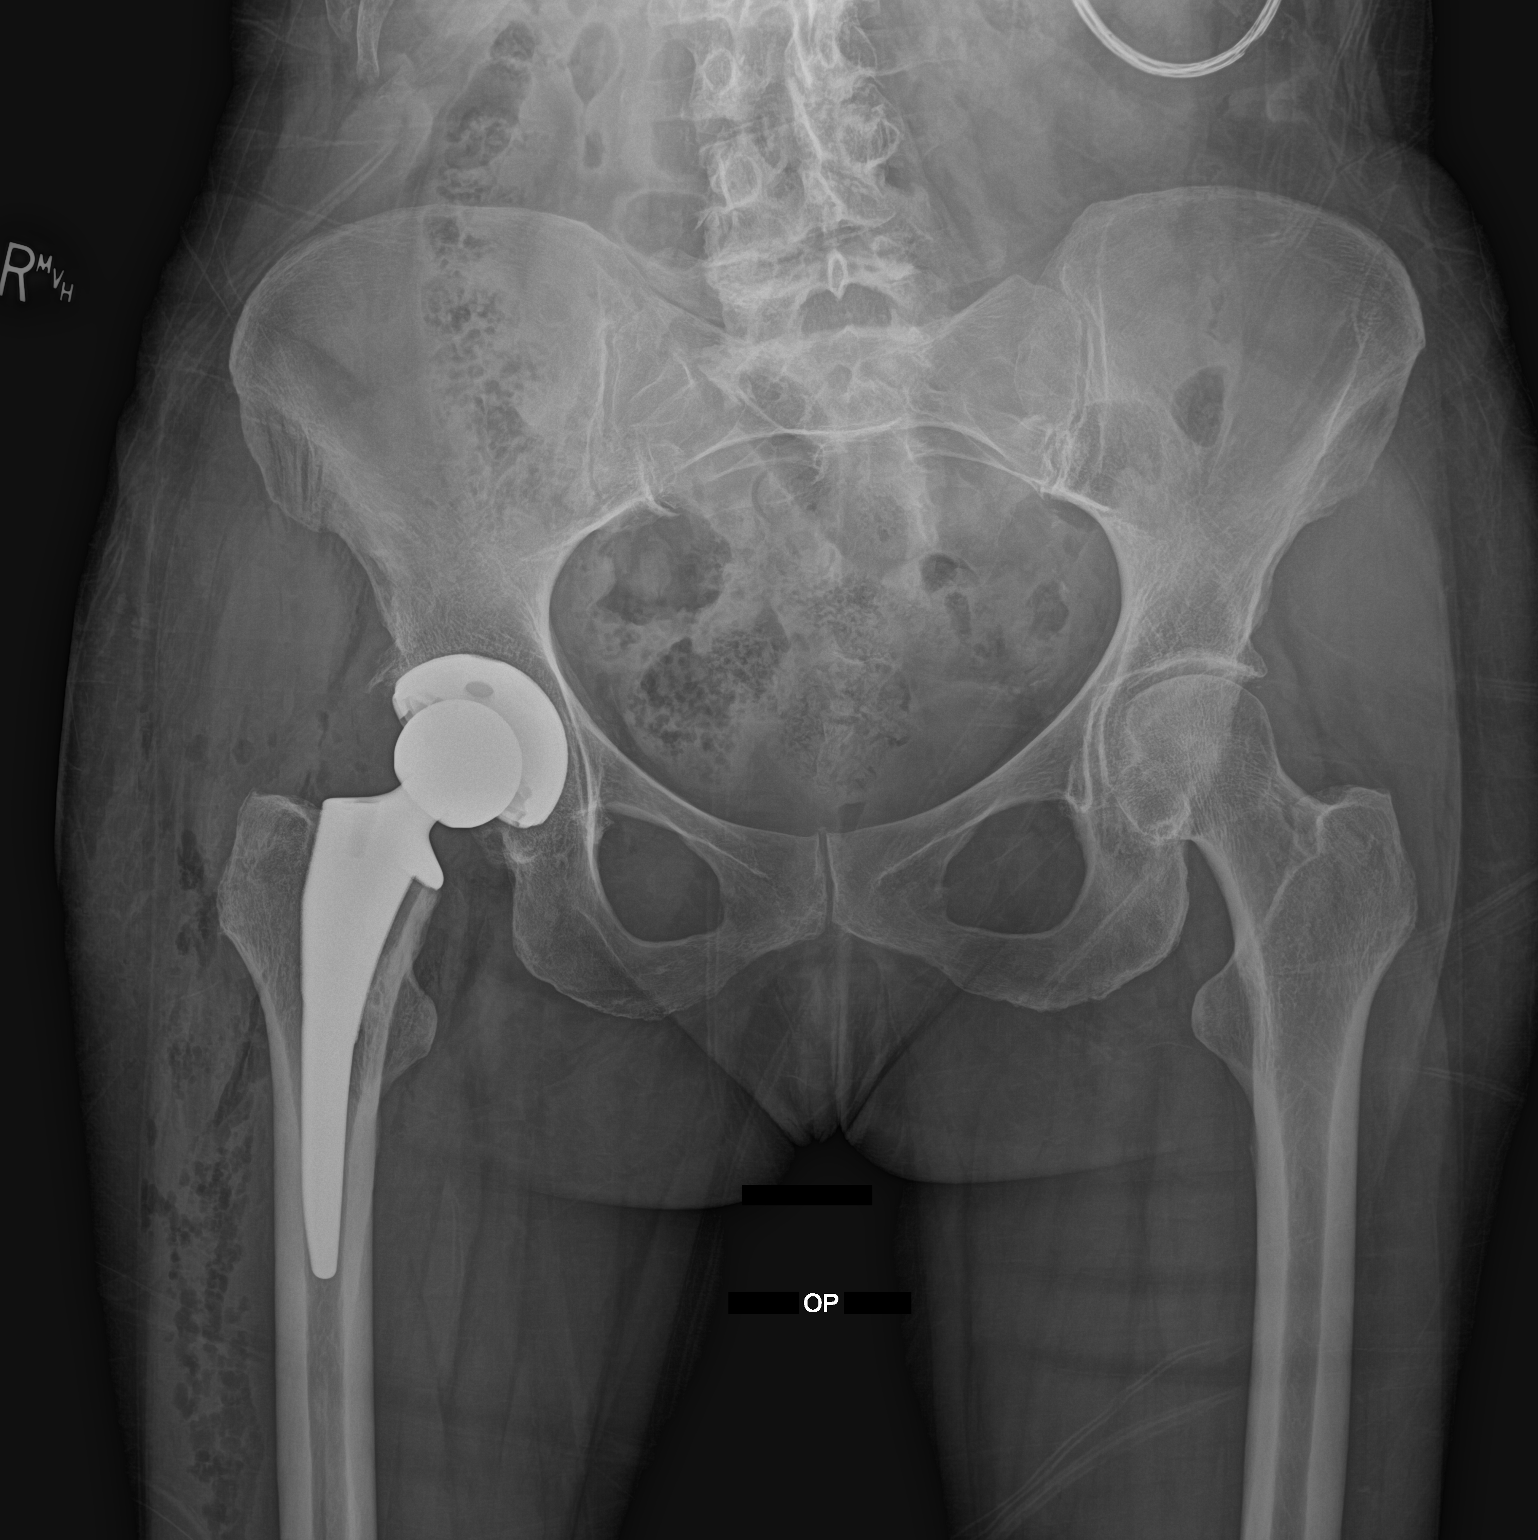

[1 of 1 positions shown; findings below may reference images not displayed]

FINDINGS: Right hip arthroplasty in expected alignment. No periprosthetic
lucency or fracture. Recent postsurgical change includes air and
edema in the soft tissues.
IMPRESSION: Right hip arthroplasty without immediate postoperative complication.

## 2023-02-20 IMAGING — RF DG HIP (WITH PELVIS) OPERATIVE*R*
1 series · 2 of 2 positions shown · non-contrast
Comparison: None.

CLINICAL DATA: Right hip replacement.

EXAM:
OPERATIVE RIGHT HIP (WITH PELVIS IF PERFORMED)
TECHNIQUE: Fluoroscopic spot image(s) were submitted for interpretation
post-operatively.

[Series 1: unknown protocol · 0.20mm/px · 2 of 2 slices shown]
[im 1/2]
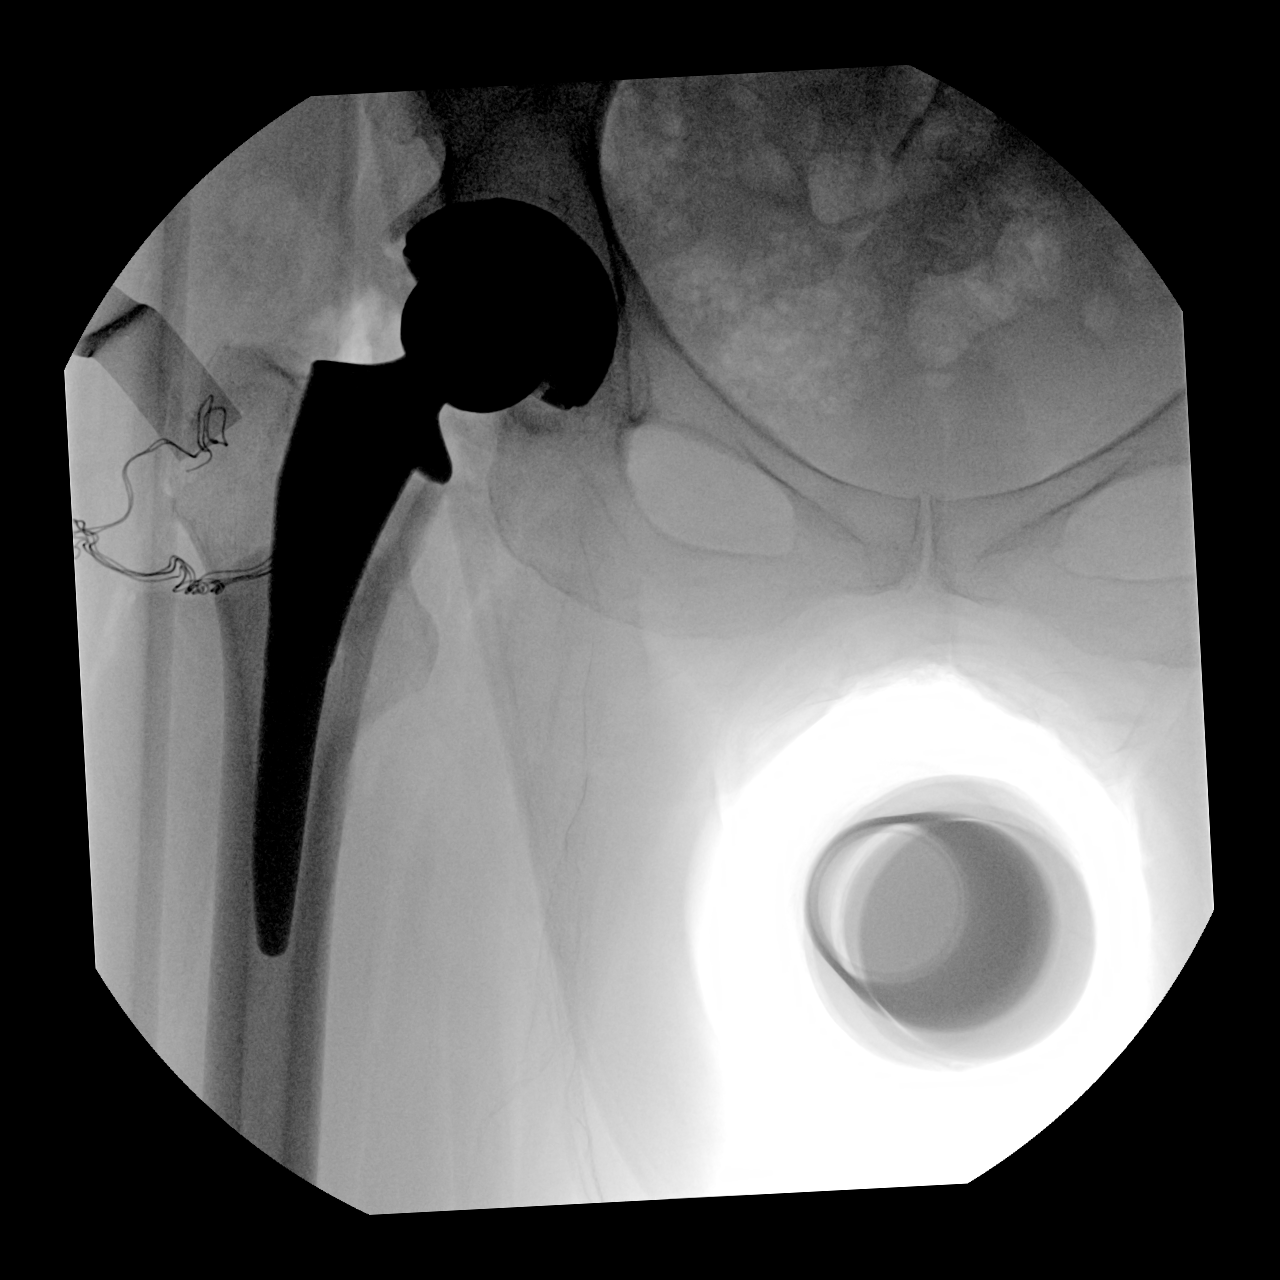
[im 2/2]
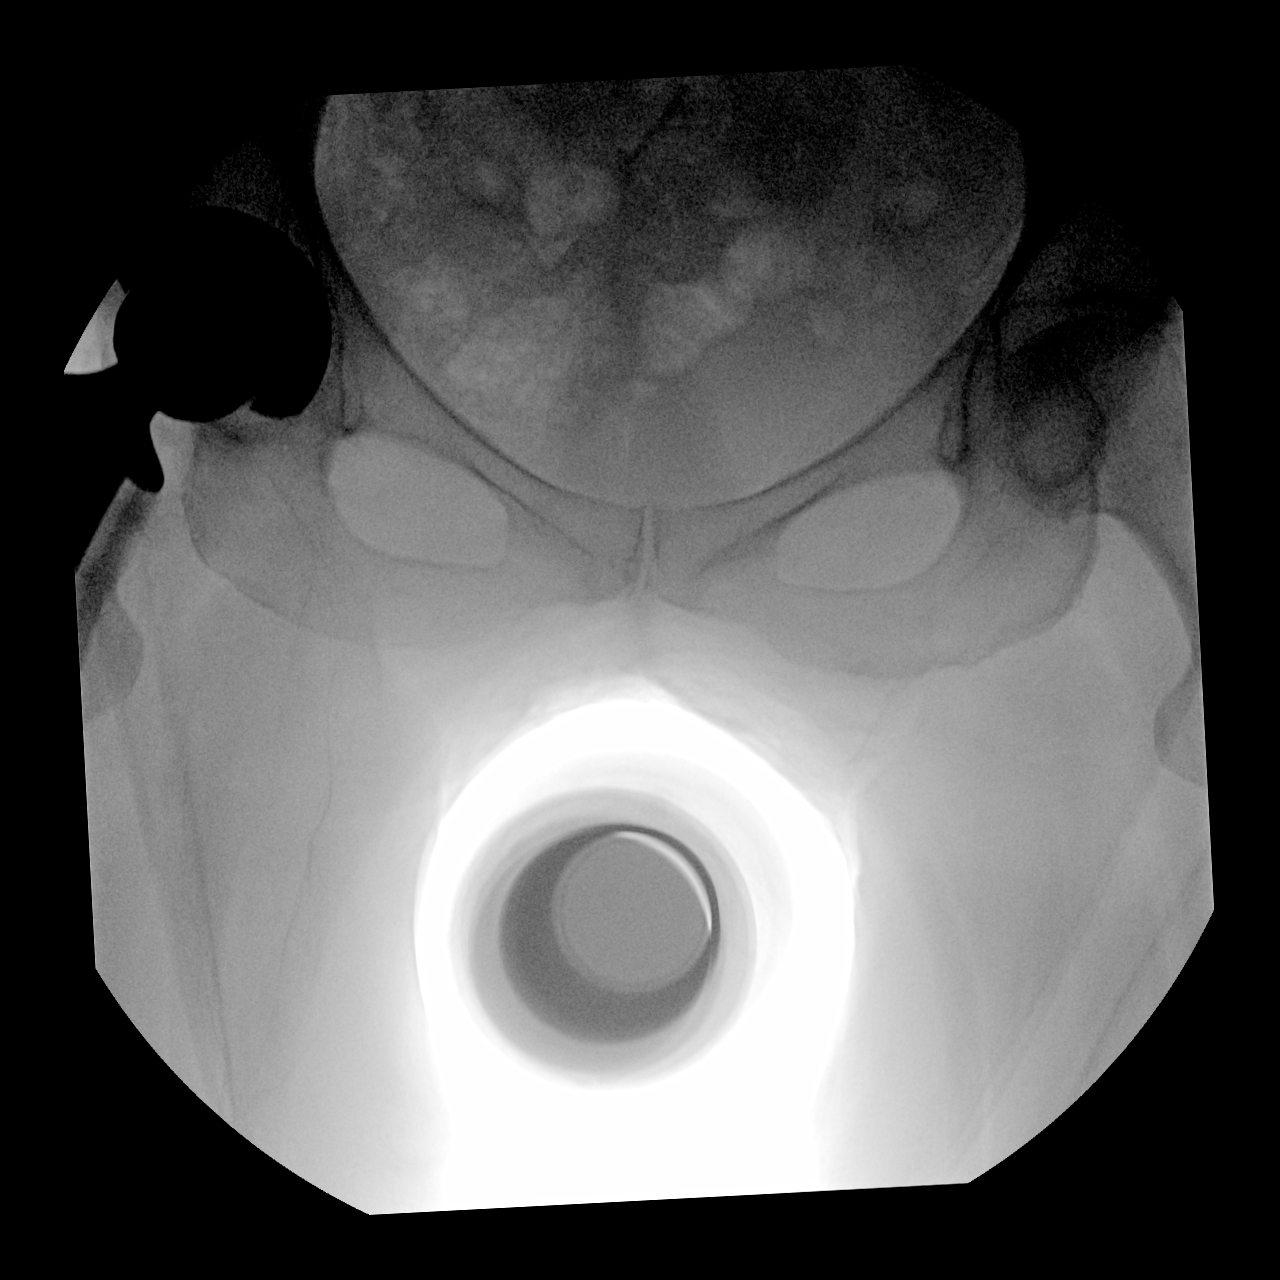

[2 of 2 positions shown; findings below may reference images not displayed]

FINDINGS: Two fluoroscopic spot views of the pelvis and right hip obtained in
the operating room. Right hip arthroplasty. Fluoroscopy time 8
seconds.
IMPRESSION: Procedural fluoroscopy for right hip arthroplasty.

## 2023-02-23 ENCOUNTER — Ambulatory Visit: Payer: Medicare Other | Admitting: Family Medicine

## 2023-02-23 ENCOUNTER — Encounter: Payer: Self-pay | Admitting: Family Medicine

## 2023-02-23 VITALS — BP 132/80 | HR 61 | Temp 97.5°F | Ht 63.0 in | Wt 145.2 lb

## 2023-02-23 DIAGNOSIS — G20A1 Parkinson's disease without dyskinesia, without mention of fluctuations: Secondary | ICD-10-CM | POA: Insufficient documentation

## 2023-02-23 DIAGNOSIS — R251 Tremor, unspecified: Secondary | ICD-10-CM

## 2023-02-23 DIAGNOSIS — R030 Elevated blood-pressure reading, without diagnosis of hypertension: Secondary | ICD-10-CM | POA: Diagnosis not present

## 2023-02-23 DIAGNOSIS — E7800 Pure hypercholesterolemia, unspecified: Secondary | ICD-10-CM | POA: Diagnosis not present

## 2023-02-23 NOTE — Progress Notes (Signed)
   Dawn Perry is a 73 y.o. female who presents today for an office visit.  Assessment/Plan:  Chronic Problems Addressed Today: Tremor Concern for Parkinson based on history.  Overall reassuring exam.  Will place referral to neurology.  Hyperlipidemia She is on Crestor  20 mg daily.  She will be getting labs checked soon.  Will add on TSH per patient request.  Elevated blood pressure reading Initially elevated however at goal on recheck.  We discussed importance of home monitoring.  She will continue monitor at home and let us  know if persistently elevated.     Subjective:  HPI:  See A/P for status of chronic conditions.  Patient is here today with chief complaint and tremor in her right arm.  This started about a month ago.  She is worried about Parkinson's disorder.  She has noticed only tremor at rest.  She has not noticed any tremor when performing any activities such as eating or writing.  She has noticed that she has been running smaller than normal recently.  She does have a strong family history of Parkinson in both her father as well as a maternal cousin.  She has not noticed any sort of rigidity.  No weakness or numbness.  No tingling.       Objective:  Physical Exam: BP 132/80   Pulse 61   Temp (!) 97.5 F (36.4 C) (Temporal)   Ht 5' 3 (1.6 m)   Wt 145 lb 3.2 oz (65.9 kg)   SpO2 100%   BMI 25.72 kg/m   Gen: No acute distress, resting comfortably CV: Regular rate and rhythm with no murmurs appreciated Pulm: Normal work of breathing, clear to auscultation bilaterally with no crackles, wheezes, or rhonchi Neuro: Cranial nerves II through XII intact.  Strength 5 out of 5 in upper and lower extremities.  Sensation light touch intact throughout.  Finger-nose-finger testing intact bilaterally.  Resting tremor noted in right hand.  No rigidity noted.  Psych: Normal affect and thought content      Dezerae Freiberger M. Kennyth, MD 02/23/2023 11:08 AM

## 2023-02-23 NOTE — Assessment & Plan Note (Signed)
Concern for Parkinson based on history.  Overall reassuring exam.  Will place referral to neurology.

## 2023-02-23 NOTE — Telephone Encounter (Signed)
Ok to change referral.  Katina Degree. Jimmey Ralph, MD 02/23/2023 3:22 PM

## 2023-02-23 NOTE — Telephone Encounter (Signed)
 See note

## 2023-02-23 NOTE — Assessment & Plan Note (Signed)
She is on Crestor 20 mg daily.  She will be getting labs checked soon.  Will add on TSH per patient request.

## 2023-02-23 NOTE — Assessment & Plan Note (Signed)
Initially elevated however at goal on recheck.  We discussed importance of home monitoring.  She will continue monitor at home and let us know if persistently elevated.

## 2023-02-23 NOTE — Patient Instructions (Signed)
 It was very nice to see you today!  We will order TSH.  I will refer you to see the neurologist.  Return in about 6 months (around 08/23/2023) for Annual Physical.   Take care, Dr Kennyth  PLEASE NOTE:  If you had any lab tests, please let us  know if you have not heard back within a few days. You may see your results on mychart before we have a chance to review them but we will give you a call once they are reviewed by us .   If we ordered any referrals today, please let us  know if you have not heard from their office within the next week.   If you had any urgent prescriptions sent in today, please check with the pharmacy within an hour of our visit to make sure the prescription was transmitted appropriately.   Please try these tips to maintain a healthy lifestyle:  Eat at least 3 REAL meals and 1-2 snacks per day.  Aim for no more than 5 hours between eating.  If you eat breakfast, please do so within one hour of getting up.   Each meal should contain half fruits/vegetables, one quarter protein, and one quarter carbs (no bigger than a computer mouse)  Cut down on sweet beverages. This includes juice, soda, and sweet tea.   Drink at least 1 glass of water  with each meal and aim for at least 8 glasses per day  Exercise at least 150 minutes every week.

## 2023-02-24 ENCOUNTER — Telehealth: Payer: Self-pay | Admitting: *Deleted

## 2023-02-24 ENCOUNTER — Other Ambulatory Visit: Payer: Self-pay | Admitting: *Deleted

## 2023-02-24 DIAGNOSIS — R251 Tremor, unspecified: Secondary | ICD-10-CM

## 2023-02-24 NOTE — Telephone Encounter (Signed)
 Referral placed.

## 2023-02-24 NOTE — Telephone Encounter (Signed)
 Copied from CRM [id number]. Topic: Referral - Request for Referral >> Feb 24, 2023  2:12 PM Evie B wrote: Did the patient discuss referral with their provider in the last year? Yes (If No - schedule appointment) (If Yes - send message)  Appointment offered? Yes  Type of order/referral and detailed reason for visit: neurologist  Preference of office, provider, location: [facility] [phone]  If referral order, have you been seen by this specialty before? No (If Yes, this issue or another issue? When? Where?  Can we respond through MyChart? No

## 2023-02-25 ENCOUNTER — Encounter: Payer: Self-pay | Admitting: Neurology

## 2023-02-25 NOTE — Telephone Encounter (Signed)
 This referral was placed already - please check with Edwina Gram.  Jinny Mounts. Daneil Dunker, MD 02/25/2023 7:30 AM

## 2023-03-01 NOTE — Progress Notes (Signed)
Assessment/Plan:   Parkinsonism.  I suspect that this does represent idiopathic Parkinson's disease.   -Discussed nature and pathophysiology.  -Patient's father had Parkinsons disease.  Discussed PD generation study and was given the link to sign up for that.  -We discussed the diagnosis as well as pathophysiology of the disease.  We discussed treatment options as well as prognostic indicators.  Patient education was provided.  -We discussed that it used to be thought that levodopa would increase risk of melanoma but now it is believed that Parkinsons itself likely increases risk of melanoma. she is to get regular skin checks.  She states that she is already doing that.  -Greater than 50% of the 60 minute visit was spent in counseling answering questions and talking about what to expect now as well as in the future.  We talked about medication options as well as potential future surgical options.  We talked about safety in the home.  -Discussed early use of levodopa and I did recommend this.  For now, she wants to think about that and declined for now.  -Discussed the importance of safe, cardiovascular exercise.  Discussed our physical therapy program with [facility].  Given information on that.  She can let me know if she would like to proceed with the referral.  -We discussed community resources in the area including patient support groups and community exercise programs for PD and pt education was provided to the patient.   -Discussed counseling for adjustment to the diagnosis.  She can let us know if she would like a referral.  -Patient requested a second opinion.  Certainly have no objection to that.  Patient requested [facility], as her father was apparently treated there.  Subjective:   Dawn Perry was seen today in the movement disorders clinic for neurologic consultation at the request of Ardith Dark, MD.  Pt with husband who supplements hx.  The consultation is for  the evaluation of rest tremor in the right arm and to rule out Parkinson's.  Notes from primary care indicate that patient had a reassuring exam, but strong family history of Parkinson's in father and maternal cousin and she was referred for further evaluation.   Specific Symptoms:  Tremor:  started 1 month ago and right hand.  She is right-hand dominant.  Only at rest, esp if watching movie at night.  She is R handed Family hx of similar:  Yes.  ,  Father and maternal cousin Voice: no change Sleep: trouble staying asleep  Vivid Dreams:  No.  Acting out dreams:  No. Wet Pillows: No. Postural symptoms:  No. But she is more careful than in the past and has R knee issue  Falls?  No. Bradykinesia symptoms:  no shuffling; rocks out of the chair; not a lot slower in speed with ambulation than in the past Loss of smell:  Yes.  , lost it prior to  Covid but wasn't sure what it was from and it never came back Loss of taste:  Yes.   Urinary Incontinence:  No. Difficulty Swallowing:  No. Handwriting, micrographia: Yes.   Trouble with ADL's:  No.  Trouble buttoning clothing: No. Depression:  No. But admits to easily being irritated Memory changes:  Yes.   (Trouble with names/word finding; still does monthly bills without trouble; takes pills without trouble) Hallucinations:  No.  visual distortions: No. N/V:  No. Lightheaded:  No.  Syncope: No. Diplopia:  she will sometimes have shadow  image with glasses Dyskinesia:  No. Prior exposure to reglan/antipsychotics: No.  Neuroimaging of the brain has not previously been performed.    ALLERGIES:  No Known Allergies  CURRENT MEDICATIONS:  Current Meds  Medication Sig   aspirin EC 81 MG tablet Take 81 mg by mouth every other day. Swallow whole.   Calcium Citrate-Vitamin D (CITRACAL + D PO) Take 1 tablet by mouth daily.   Multiple Vitamin (MULTI-VITAMIN DAILY) TABS Take 1 tablet by mouth daily.   rosuvastatin (CRESTOR) 20 MG tablet Take 1 tablet  (20 mg total) by mouth daily.     Objective:   VITALS:   Vitals:   03/02/23 1323  BP: 126/77  Pulse: (!) 56  SpO2: 96%  Weight: 146 lb 3.2 oz (66.3 kg)  Height: 5\' 4"  (1.626 m)    GEN:  The patient appears stated age and is in NAD. HEENT:  Normocephalic, atraumatic.  The mucous membranes are moist. The superficial temporal arteries are without ropiness or tenderness. CV:  RRR Lungs:  CTAB Neck/HEME:  There are no carotid bruits bilaterally.  Neurological examination:  Orientation: The patient is alert and oriented x3.  Cranial nerves: There is good facial symmetry. Min facial hypomimia.  Extraocular muscles are intact. The visual fields are full to confrontational testing. The speech is fluent and clear. Soft palate rises symmetrically and there is no tongue deviation. Hearing is intact to conversational tone. Sensation: Sensation is intact to light touch throughout (facial, trunk, extremities). Vibration is intact at the bilateral ankle/toe. There is no extinction with double simultaneous stimulation. There is no sensory dermatomal level identified. Motor: Strength is 5/5 in the bilateral upper and lower extremities.   Shoulder shrug is equal and symmetric.  There is no pronator drift. Deep tendon reflexes: Deep tendon reflexes are 2-/4 at the bilateral biceps, triceps, brachioradialis, patella and achilles. Plantar responses are downgoing bilaterally.  Movement examination: Tone: There is mild increased tone in the RUE.  Tone elsewhere is nl Abnormal movements: there is mild intermittent RUE rest tremor that just slightly increases with distraction Coordination:  There is  decremation with RAM's, only with finger taps and hand opening and closing on the R Gait and Station: The patient has no difficulty arising out of a deep-seated chair without the use of the hands. The patient's stride length is good.  The patient has a neg pull test.     I have reviewed and interpreted the  following labs independently   Chemistry      Component Value Date/Time   NA 142 04/29/2022 0911   K 4.8 04/29/2022 0911   CL 107 (H) 04/29/2022 0911   CO2 21 04/29/2022 0911   BUN 19 04/29/2022 0911   CREATININE 0.84 04/29/2022 0911   CREATININE 0.81 10/18/2015 0924      Component Value Date/Time   CALCIUM 9.5 04/29/2022 0911   ALKPHOS 85 04/29/2022 0911   AST 28 04/29/2022 0911   ALT 28 04/29/2022 0911   BILITOT 0.4 04/29/2022 0911      Lab Results  Component Value Date   TSH 2.620 10/21/2017   Lab Results  Component Value Date   WBC 4.9 01/24/2021   HGB 14.1 01/24/2021   HCT 42.3 01/24/2021   MCV 90.0 01/24/2021   PLT 205 01/24/2021     Total time spent on today's visit was 60 minutes, including both face-to-face time and nonface-to-face time.  Time included that spent on review of records (prior notes available to me/labs/imaging if  pertinent), discussing treatment and goals, answering patient's questions and coordinating care.  Cc:  Ardith Dark, MD

## 2023-03-02 ENCOUNTER — Ambulatory Visit (INDEPENDENT_AMBULATORY_CARE_PROVIDER_SITE_OTHER): Payer: Medicare Other | Admitting: Neurology

## 2023-03-02 ENCOUNTER — Encounter: Payer: Self-pay | Admitting: Neurology

## 2023-03-02 VITALS — BP 126/77 | HR 56 | Ht 64.0 in | Wt 146.2 lb

## 2023-03-02 DIAGNOSIS — G20A1 Parkinson's disease without dyskinesia, without mention of fluctuations: Secondary | ICD-10-CM | POA: Diagnosis not present

## 2023-03-02 NOTE — Patient Instructions (Addendum)
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  We discussed that we would recommend carbidopa/levodopa.  We discussed the best thing would be exercise and we have included exercise programs in the bag we have given you.  We discussed counseling for adjustment to the diagnosis.  We discussed referrals to Physical therapy.  Let us know if we can do any of these things for you!    The website for the Parkinsons Disease foundation is for the gene study is: DemocraticPeople.com.cy  Local and Online Resources for Power over Parkinson's Group?  Febraury 2025 ?  LOCAL Soper PARKINSON'S GROUPS??  Power over Parkinson's Group:???  Upcoming Power over [employer] Meetings/Care Partner Support:? 2nd Wednesdays of the month at 2 pm:  February 12th, March 12th Contact Lynwood Dawley at [email or web address] or Amy Marriott at [email or web address] if interested in participating in this group?  ?  LOCAL EVENTS AND NEW OFFERINGS?  Dance Project Spring 2025:  January 14-May 20, Tuesdays 10-11 am.  All details on website: BikerFestival.is ACT FITNESS Chair Yoga classes "Train and Gain", Fridays 10 am, [facility] [employer].  Contact Gina at [phone].   PWR! Moves [place] class!  Wednesdays at 10 am.  Please contact Lonia Blood, PT at [email or web address] if interested. Health visitor Classes offering at [employer]!? Tuesdays (Chair Yoga)  and Wednesdays (PWR! Moves)  1:00 pm.?? Contact Aldona Lento [phone] or [email or web address] Drumming for Parkinson's will be held on 2nd and 4th Mondays at 11:00 am.?? Located at the [facility] [place] ([address] [place].) ? Contact Albertina Parr at [email or web address] or [phone]?  [employer] Parkinson's Tai Chi Class, Mondays at 11 am.  Call [phone] for  details  [facility] at [facility]- [address], [place] Wednesdays- 4:00 - 5:00 PM - specifically for Parkinson's Disease.  Free!  Contact Denny Peon, [place] - [phone] (clinic) or  [phone] (cell) or by email: [email or web address]   ?ONLINE EDUCATION AND SUPPORT?  Parkinson Foundation:? www.parkinson.org?  PD Health at Home continues:? Mindfulness Mondays, Wellness Wednesdays, Fitness Fridays??  Upcoming Education:??  Learn More, Live Better.  Parkinson's Symposium Scientist, water quality and [place], [place]).   Saturday, February 8th, 9:30-2:00 Staying Connected:  Nurturing Wellness beyond [employer]. Wednesday, February 12th, 1-2 pm Impulse Control Disorders:  Understanding and Managing the Challenges.  Wednesday, February 19th, 1-2 pm Veterans and Parkinson's:  [employer].  Thursday, February 27th, 2:30-4 pm Care Partner Conversations.  Wednesday, March 5th, 1-2 pm Expert Briefing:    Nourishing Wellness-Nutrition for [employer].  Wednesday, March 12th, 1-2 pm. Register for virtual education and Photographer (webinars) at [employer]ds.gl  Please check out their website to sign up for emails and see their full online offerings??  ?  [employer] Foundation:? www.michaeljfox.org??  Third Thursday Webinars:? On the third Thursday of every month at 12 p.m. ET, join our free live webinars to learn about various aspects of living with Parkinson's disease and our work to speed medical breakthroughs.?  Upcoming Webinar:? How Government Policies Impact Parkinson's Research:  Wins and Next Steps.  Thursday, February 20th at 12 noon.  Check out additional information on their website to see their full online offerings?  ?  [employer]:? www.davisphinneyfoundation.org?  Upcoming Webinar:   Overcoming Hurdles to Exercise.  Tuesday, February 18th at 5 pm Series:? Living with Parkinson's Meetup.??  Third Thursdays each month, 3 pm?  Care Partner Monthly Meetup.? With Jillene Bucks [place].? First Tuesday of each month, 2 pm?  Check out additional information to Live Well Today on their website?  ?  Parkinson and Movement Disorders (PMD) Alliance:? www.pmdalliance.org?  NeuroLife Online:? Online Education Events?  Sign up for emails, which are sent weekly to give you updates on programming and online offerings?  ?  Parkinson's Association of [place]:? www.parkinsonassociation.org?  Information on online support groups, education events, and online exercises including Yoga, Parkinson's exercises and more-LOTS of information on links to PD resources and online events?  Virtual Support Group through [employer] of the [place]-First Wednesday of each month at 2 pm   MOVEMENT AND EXERCISE OPPORTUNITIES?  PWR! Moves [place] class continues!  Wednesdays at 10 am.  Please contact Lonia Blood, PT at [email or web address] if interested. Parkinson's Exercise Class offerings at [employer]. Tuesdays (Chair yoga) and Wednesdays (PWR! Moves)  1:00 pm.?  Contact Aldona Lento [phone] or [email or web address]  Parkinson's Wellness Recovery (PWR! Moves)? www.pwr4life.org?  Info on the PWR! Virtual Experience:? You will have access to our expertise?through self-assessment, guided plans that start with the PD-specific fundamentals, educational content, tips, Q&A with an expert, and a growing Engineering geologist of PD-specific pre-recorded and live exercise classes of varying types and intensity - both physical and cognitive! If that is not enough, we offer 1:1 wellness consultations (in-person or virtual) to personalize your PWR! Dance movement psychotherapist.??  Parkinson [employer] Fridays:??  As part of the PD Health @ Home program, this free video series focuses each week on one aspect of fitness designed to support people living with Parkinson's.? These weekly videos  highlight the Parkinson Foundation fitness guidelines for people with Parkinson's disease.?  MenusLocal.com.br?  Dance for PD website is offering free, live-stream classes throughout the week, as well as links to [employer] of classes:? https://danceforparkinsons.org/?  Virtual dance and Pilates for Parkinson's classes: Click on the Community Tab> Parkinson's Movement Initiative Tab.? To register for classes and for more information, visit www.NoteBack.co.za and click the "community" tab.??  [employer] Parkinson's Cycling Classes??  [facility] [employer]:? Thursdays @ Noon-Live classes at [employer] ([employer] at [email or web address]?or [phone])?  [facility] [employer] [facility]: Classes Tuesday, Wednesday and Thursday (contact [place] at [email or web address] ?or [phone])?  [employer]?  Varied levels of classes are offered Tuesdays and Thursdays at [facility].??  Stretching with Byrd Hesselbach weekly class is also offered for people with Parkinson's?  To observe a class or for more information, call [phone] or email Patricia Nettle at [email or web address]?    ADDITIONAL SUPPORT AND RESOURCES?  Well-Spring Solutions:  Chiropractor:? www.well-springsolutions.org/caregiver-education/caregiver-support-group.? You may also contact Loleta Chance at [facility] -spring.org or [phone].????  Well-Spring Navigator:? Just1Navigator program, a?free service to help individuals and families through the journey of determining care for older adults.? The "Navigator" is a Child psychotherapist, Sidney Ace, who will speak with a prospective client and/or loved ones to provide an assessment of the situation and a set of recommendations for a personalized care plan -- all free of charge, and whether?Well-Spring [employer] offers the needed service or not. If the need is not a service we  provide, we are well-connected with reputable programs in town that we can refer you to.? www.well-springsolutions.org or to speak with the Navigator, call [phone].?

## 2023-03-04 ENCOUNTER — Telehealth: Payer: Self-pay | Admitting: Neurology

## 2023-03-04 NOTE — Telephone Encounter (Signed)
Pt cld what was the prescribed Rx and dosage for insurance purposes

## 2023-03-04 NOTE — Telephone Encounter (Signed)
Called patient and gave her information she asked about

## 2023-03-11 ENCOUNTER — Telehealth: Payer: Self-pay | Admitting: Family Medicine

## 2023-03-11 NOTE — Telephone Encounter (Unsigned)
Copied from CRM [id number]. Topic: Referral - Question >> Mar 11, 2023 11:13 AM Drema Balzarine wrote: Reason for CRM: Please fax Neurology referral asap to [facility] at [phone]

## 2023-03-15 ENCOUNTER — Other Ambulatory Visit: Payer: Self-pay | Admitting: Family Medicine

## 2023-03-15 DIAGNOSIS — E7800 Pure hypercholesterolemia, unspecified: Secondary | ICD-10-CM | POA: Diagnosis not present

## 2023-03-15 DIAGNOSIS — Z5181 Encounter for therapeutic drug level monitoring: Secondary | ICD-10-CM | POA: Diagnosis not present

## 2023-03-15 DIAGNOSIS — R030 Elevated blood-pressure reading, without diagnosis of hypertension: Secondary | ICD-10-CM | POA: Diagnosis not present

## 2023-03-16 ENCOUNTER — Encounter: Payer: Self-pay | Admitting: Family Medicine

## 2023-03-16 LAB — COMPREHENSIVE METABOLIC PANEL WITH GFR
ALT: 27 [IU]/L (ref 0–32)
AST: 25 [IU]/L (ref 0–40)
Albumin: 4.7 g/dL (ref 3.8–4.8)
Alkaline Phosphatase: 81 [IU]/L (ref 44–121)
BUN/Creatinine Ratio: 21 (ref 12–28)
BUN: 20 mg/dL (ref 8–27)
Bilirubin Total: 0.6 mg/dL (ref 0.0–1.2)
CO2: 21 mmol/L (ref 20–29)
Calcium: 9.7 mg/dL (ref 8.7–10.3)
Chloride: 104 mmol/L (ref 96–106)
Creatinine, Ser: 0.94 mg/dL (ref 0.57–1.00)
Globulin, Total: 2.4 g/dL (ref 1.5–4.5)
Glucose: 93 mg/dL (ref 70–99)
Potassium: 4.8 mmol/L (ref 3.5–5.2)
Sodium: 143 mmol/L (ref 134–144)
Total Protein: 7.1 g/dL (ref 6.0–8.5)
eGFR: 64 mL/min/{1.73_m2}

## 2023-03-16 LAB — LIPID PANEL
Chol/HDL Ratio: 3.4 ratio (ref 0.0–4.4)
Cholesterol, Total: 171 mg/dL (ref 100–199)
HDL: 51 mg/dL
LDL Chol Calc (NIH): 90 mg/dL (ref 0–99)
Triglycerides: 173 mg/dL — ABNORMAL HIGH (ref 0–149)
VLDL Cholesterol Cal: 30 mg/dL (ref 5–40)

## 2023-03-16 LAB — TSH: TSH: 2.74 u[IU]/mL (ref 0.450–4.500)

## 2023-03-16 NOTE — Progress Notes (Signed)
 Thyroid level is normal.

## 2023-03-18 ENCOUNTER — Encounter: Payer: Self-pay | Admitting: Family Medicine

## 2023-03-18 ENCOUNTER — Ambulatory Visit (INDEPENDENT_AMBULATORY_CARE_PROVIDER_SITE_OTHER): Payer: Medicare Other | Admitting: Family Medicine

## 2023-03-18 VITALS — BP 124/71 | HR 56 | Temp 97.9°F | Ht 64.0 in | Wt 143.2 lb

## 2023-03-18 DIAGNOSIS — G20A1 Parkinson's disease without dyskinesia, without mention of fluctuations: Secondary | ICD-10-CM | POA: Diagnosis not present

## 2023-03-18 DIAGNOSIS — E7800 Pure hypercholesterolemia, unspecified: Secondary | ICD-10-CM | POA: Diagnosis not present

## 2023-03-18 NOTE — Progress Notes (Signed)
   Dawn Perry is a 73 y.o. female who presents today for an office visit.  Assessment/Plan:  Chronic Problems Addressed Today: Parkinson disease (HCC) Overall symptoms are manageable.  She would like to be referred to a movement specialist neurologist at [facility] for second opinion and to discuss other management options.  Will place referral today.  Hyperlipidemia On Crestor 20 mg daily.  Tolerating well.  LDL 90 on recent lipid panel.     Subjective:  HPI:  Patient is here today for follow-up.  I saw her a few weeks ago.  At her last visit here we referred her to neurology for evaluation for her tremor with concern for Parkinson Disease.  She saw Dr. Arbutus Leas a couple weeks ago and was diagnosed with Parkinson disease.  They had discussed starting levodopa however she had declined.  She would like to be referred to see a movement specialist neurologist at [facility].       Objective:  Physical Exam: BP 124/71   Pulse (!) 56   Temp 97.9 F (36.6 C) (Temporal)   Ht 5\' 4"  (1.626 m)   Wt 143 lb 3.2 oz (65 kg)   SpO2 100%   BMI 24.58 kg/m   Gen: No acute distress, resting comfortably Neuro: Grossly normal, moves all extremities Psych: Normal affect and thought content      Janiyah Beery M. Jimmey Ralph, MD 03/18/2023 11:35 AM

## 2023-03-18 NOTE — Patient Instructions (Signed)
 It was very nice to see you today!  We will refer you to [facility] movement specialist.  Return if symptoms worsen or fail to improve.   Take care, Dr Jimmey Ralph  PLEASE NOTE:  If you had any lab tests, please let us know if you have not heard back within a few days. You may see your results on mychart before we have a chance to review them but we will give you a call once they are reviewed by [place].   If we ordered any referrals today, please let us know if you have not heard from their office within the next week.   If you had any urgent prescriptions sent in today, please check with the pharmacy within an hour of our visit to make sure the prescription was transmitted appropriately.   Please try these tips to maintain a healthy lifestyle:  Eat at least 3 REAL meals and 1-2 snacks per day.  Aim for no more than 5 hours between eating.  If you eat breakfast, please do so within one hour of getting up.   Each meal should contain half fruits/vegetables, one quarter protein, and one quarter carbs (no bigger than a computer mouse)  Cut down on sweet beverages. This includes juice, soda, and sweet tea.   Drink at least 1 glass of water with each meal and aim for at least 8 glasses per day  Exercise at least 150 minutes every week.

## 2023-03-18 NOTE — Assessment & Plan Note (Signed)
 On Crestor 20 mg daily.  Tolerating well.  LDL 90 on recent lipid panel.

## 2023-03-18 NOTE — Assessment & Plan Note (Signed)
 Overall symptoms are manageable.  She would like to be referred to a movement specialist neurologist at [facility] for second opinion and to discuss other management options.  Will place referral today.

## 2023-03-19 ENCOUNTER — Encounter: Payer: Self-pay | Admitting: Family Medicine

## 2023-03-19 NOTE — Telephone Encounter (Signed)
 Noted.

## 2023-03-25 ENCOUNTER — Encounter (HOSPITAL_BASED_OUTPATIENT_CLINIC_OR_DEPARTMENT_OTHER): Payer: Self-pay

## 2023-03-25 DIAGNOSIS — E7800 Pure hypercholesterolemia, unspecified: Secondary | ICD-10-CM

## 2023-03-26 MED ORDER — ROSUVASTATIN CALCIUM 40 MG PO TABS: 40.0000 mg | ORAL_TABLET | Freq: Every day | ORAL | 1 refills | Status: DC

## 2023-03-26 NOTE — Addendum Note (Signed)
 Addended by: Gabriel Rung on: 03/26/2023 09:28 AM   Modules accepted: Orders

## 2023-05-26 ENCOUNTER — Ambulatory Visit (INDEPENDENT_AMBULATORY_CARE_PROVIDER_SITE_OTHER)

## 2023-05-26 VITALS — Ht 64.0 in | Wt 143.0 lb

## 2023-05-26 DIAGNOSIS — Z0000 Encounter for general adult medical examination without abnormal findings: Secondary | ICD-10-CM

## 2023-05-26 NOTE — Patient Instructions (Signed)
 Dawn Perry , Thank you for taking time to come for your Medicare Wellness Visit. I appreciate your ongoing commitment to your health goals. Please review the following plan we discussed and let me know if I can assist you in the future.   Referrals/Orders/Follow-Ups/Clinician Recommendations: maintain health and activity   This is a list of the screening recommended for you and due dates:  Health Maintenance  Topic Date Due   Zoster (Shingles) Vaccine (1 of 2) 03/16/2000   COVID-19 Vaccine (5 - 2024-25 season) 09/20/2022   Medicare Annual Wellness Visit  05/11/2023   Hepatitis C Screening  02/23/2024*   Flu Shot  08/20/2023   Mammogram  12/30/2024   DEXA scan (bone density measurement)  12/30/2024   Cologuard (Stool DNA test)  03/09/2025   Pneumonia Vaccine  Completed   HPV Vaccine  Aged Out   Meningitis B Vaccine  Aged Out   DTaP/Tdap/Td vaccine  Discontinued  *Topic was postponed. The date shown is not the original due date.    Advanced directives: (Copy Requested) Please bring a copy of your health care power of attorney and living will to the office to be added to your chart at your convenience. You can mail to [facility] [address] [place], [place] [address] or email to [email or web address]  Next Medicare Annual Wellness Visit scheduled for next year: Yes  Have you seen your provider in the last 6 months (3 months if uncontrolled diabetes)? Yes 03/18/23

## 2023-05-26 NOTE — Progress Notes (Signed)
 Subjective:   Dawn Perry is a 73 y.o. who presents for a Medicare Wellness preventive visit.  Visit Complete: Virtual I connected with  Dawn Perry on 05/26/23 by a audio enabled telemedicine application and verified that I am speaking with the correct person using two identifiers.  Patient Location: Home  Provider Location: Home Office  I discussed the limitations of evaluation and management by telemedicine. The patient expressed understanding and agreed to proceed.  Vital Signs: Because this visit was a virtual/telehealth visit, some criteria may be missing or patient reported. Any vitals not documented were not able to be obtained and vitals that have been documented are patient reported.  VideoDeclined- This patient declined Librarian, academic. Therefore the visit was completed with audio only.  Persons Participating in Visit: Patient.  AWV Questionnaire: Yes: Patient Medicare AWV questionnaire was completed by the patient on 05/25/23; I have confirmed that all information answered by patient is correct and no changes since this date.  Cardiac Risk Factors include: advanced age (>55men, >4 women);dyslipidemia     Objective:    Today's Vitals   05/26/23 1423  Weight: 143 lb (64.9 kg)  Height: 5\' 4"  (1.626 m)   Body mass index is 24.55 kg/m.     05/26/2023    2:28 PM 03/02/2023    1:19 PM 05/11/2022    3:35 PM 02/06/2021    6:01 AM 01/24/2021    9:56 AM 10/09/2020    3:40 PM 10/01/2020    9:52 AM  Advanced Directives  Does Patient Have a Medical Advance Directive? Yes Yes Yes Yes Yes Yes Yes  Type of Estate agent of [place];Living will Living will Healthcare Power of [place];Living will  Healthcare Power of [place];Living will Living will;Out of facility DNR (pink MOST or yellow form) Healthcare Power of [place];Living will  Does patient want to make changes to medical advance directive?      No - Patient  declined   Copy of Healthcare Power of Attorney in Chart? No - copy requested  No - copy requested No - copy requested No - copy requested      Current Medications (verified) Outpatient Encounter Medications as of 05/26/2023  Medication Sig   aspirin  EC 81 MG tablet Take 81 mg by mouth every other day. Swallow whole.   Calcium  Citrate-Vitamin D (CITRACAL + D PO) Take 1 tablet by mouth daily.   Multiple Vitamin (MULTI-VITAMIN DAILY) TABS Take 1 tablet by mouth daily.   rosuvastatin  (CRESTOR ) 40 MG tablet Take 1 tablet (40 mg total) by mouth daily.   No facility-administered encounter medications on file as of 05/26/2023.    Allergies (verified) Patient has no known allergies.   History: Past Medical History:  Diagnosis Date   CAD in native artery 09/26/2020   Elevated blood pressure reading 09/26/2020   Family history of adverse reaction to anesthesia    mother had problems with N/V   GERD (gastroesophageal reflux disease)    B hands   H/O scarlet fever    age 3   Heart murmur    History of chicken pox    Hyperlipidemia    LBBB (left bundle branch block)    Osteoarthritis    PONV (postoperative nausea and vomiting)    Past Surgical History:  Procedure Laterality Date   Broke Left forearm     fracture left arm surgery      REVERSE SHOULDER ARTHROPLASTY Right 02/06/2021   Procedure: Right Reverse shoulder  arthroplasty; hardware removal;  Surgeon: Ellard Gunning, MD;  Location: [facility];  Service: Orthopedics;  Laterality: Right;  , screw removed   right shoulder surgery      Right shoulder tear  01/20/1988   TOTAL HIP ARTHROPLASTY Right 10/09/2020   Procedure: TOTAL HIP ARTHROPLASTY ANTERIOR APPROACH;  Surgeon: Liliane Rei, MD;  Location: [facility];  Service: Orthopedics;  Laterality: Right;   Family History  Problem Relation Age of Onset   Hyperlipidemia Mother    Other Mother        abnormal EKG's   Arthritis Mother    Stroke Mother        hemorrhagic   Arthritis  Father    Parkinson's disease Father    Autoimmune disease Sister    Coronary artery disease Brother        81   Cancer Neg Hx    Social History   Socioeconomic History   Marital status: Married    Spouse name: Not on file   Number of children: 2   Years of education: Not on file   Highest education level: Bachelor's degree (e.g., BA, AB, BS)  Occupational History   Occupation: Technical sales engineer   Occupation: retired    Comment: Technical sales engineer  Tobacco Use   Smoking status: Never   Smokeless tobacco: Never  Vaping Use   Vaping status: Never Used  Substance and Sexual Activity   Alcohol use: Never   Drug use: Never   Sexual activity: Yes    Partners: Male  Other Topics Concern   Not on file  Social History Narrative   1 small dog    Right handed    Retired    Teacher, early years/pre Strain: Low Risk  (05/26/2023)   Overall Financial Resource Strain (CARDIA)    Difficulty of Paying Living Expenses: Not hard at all  Food Insecurity: No Food Insecurity (05/26/2023)   Hunger Vital Sign    Worried About Running Out of Food in the Last Year: Never true    Ran Out of Food in the Last Year: Never true  Transportation Needs: No Transportation Needs (05/26/2023)   PRAPARE - Administrator, Civil Service (Medical): No    Lack of Transportation (Non-Medical): No  Physical Activity: Insufficiently Active (05/26/2023)   Exercise Vital Sign    Days of Exercise per Week: 7 days    Minutes of Exercise per Session: 20 min  Stress: No Stress Concern Present (05/26/2023)   [employer] of Occupational Health - Occupational Stress Questionnaire    Feeling of Stress : Not at all  Social Connections: Moderately Isolated (05/26/2023)   Social Connection and Isolation Panel [NHANES]    Frequency of Communication with Friends and Family: More than three times a week    Frequency of Social Gatherings with Friends and Family: More than three times a  week    Attends Religious Services: Never    Database administrator or Organizations: No    Attends Engineer, structural: Never    Marital Status: Married    Tobacco Counseling Counseling given: Not Answered    Clinical Intake:  Pre-visit preparation completed: Yes  Pain : No/denies pain     BMI - recorded: 24.55 Nutritional Status: BMI of 19-24  Normal Diabetes: No  No results found for: "HGBA1C"   How often do you need to have someone help you when you read instructions, pamphlets, or other written materials  from your doctor or pharmacy?: 1 - Never  Interpreter Needed?: No  Information entered by :: Lamont Pilsner, LPN   Activities of Daily Living     05/25/2023    3:28 PM  In your present state of health, do you have any difficulty performing the following activities:  Hearing? 0  Vision? 0  Difficulty concentrating or making decisions? 0  Walking or climbing stairs? 0  Dressing or bathing? 0  Doing errands, shopping? 0  Preparing Food and eating ? N  Using the Toilet? N  In the past six months, have you accidently leaked urine? N  Do you have problems with loss of bowel control? N  Managing your Medications? N  Managing your Finances? N  Housekeeping or managing your Housekeeping? N    Patient Care Team: Rodney Clamp, MD as PCP - General (Family Medicine) Maudine Sos, MD as PCP - Cardiology (Cardiology) Darlis Eisenmenger, MD (Cardiology) Amedeo Jupiter, MD (Ophthalmology) Eduardo Grade, MD (Dermatology)  Indicate any recent Medical Services you may have received from other than [facility] providers in the past year (date may be approximate).     Assessment:   This is a routine wellness examination for Dawn Perry.  Hearing/Vision screen Hearing Screening - Comments:: Pt denies any hearing issues  Vision Screening - Comments:: Wears rx glasses - up to date with routine eye exams with Alexia Idler     Goals Addressed              This Visit's Progress    Patient Stated       Maintain health and activity        Depression Screen     05/26/2023    2:26 PM 03/18/2023   11:02 AM 02/23/2023   10:24 AM 11/04/2022    8:13 AM 05/11/2022    3:34 PM 12/22/2021   10:28 AM 01/10/2019    8:49 AM  PHQ 2/9 Scores  PHQ - 2 Score 0 0 0 0 0 0 0    Fall Risk     05/25/2023    3:28 PM 03/18/2023   11:02 AM 03/02/2023    1:19 PM 02/23/2023   10:24 AM 11/04/2022    8:13 AM  Fall Risk   Falls in the past year? 0 0 0 0 0  Number falls in past yr: 0 0 0 0 0  Injury with Fall? 0 0 0 0 0  Risk for fall due to : No Fall Risks No Fall Risks  No Fall Risks No Fall Risks  Follow up Falls prevention discussed  Falls evaluation completed      MEDICARE RISK AT HOME:  Medicare Risk at Home Any stairs in or around the home?: (Patient-Rptd) Yes If so, are there any without handrails?: (Patient-Rptd) No Home free of loose throw rugs in walkways, pet beds, electrical cords, etc?: (Patient-Rptd) Yes Adequate lighting in your home to reduce risk of falls?: (Patient-Rptd) Yes Use of a cane, walker or w/c?: (Patient-Rptd) No Grab bars in the bathroom?: (Patient-Rptd) Yes Shower chair or bench in shower?: (Patient-Rptd) Yes Elevated toilet seat or a handicapped toilet?: (Patient-Rptd) Yes  TIMED UP AND GO:  Was the test performed?  No  Cognitive Function: 6CIT completed        05/26/2023    2:29 PM 05/11/2022    3:37 PM  6CIT Screen  What Year? 0 points 0 points  What month? 0 points 0 points  What time? 0 points 0 points  Count  back from 20 0 points 0 points  Months in reverse 0 points 0 points  Repeat phrase 0 points 0 points  Total Score 0 points 0 points    Immunizations Immunization History  Administered Date(s) Administered   Fluad Quad(high Dose 65+) 11/08/2018, 12/29/2019   Influenza, High Dose Seasonal PF 11/10/2017   Influenza,inj,Quad PF,6+ Mos 11/01/2012   PFIZER(Purple Top)SARS-COV-2 Vaccination 02/09/2019,  03/02/2019, 10/26/2019   Pfizer Covid-19 Vaccine Bivalent Booster 11yrs & up 12/28/2020   Pneumococcal Conjugate-13 11/08/2018   Pneumococcal Polysaccharide-23 12/29/2019   Tdap 07/29/2011   Zoster, Live 07/29/2011    Screening Tests Health Maintenance  Topic Date Due   Zoster Vaccines- Shingrix (1 of 2) 03/16/2000   COVID-19 Vaccine (5 - 2024-25 season) 09/20/2022   Hepatitis C Screening  02/23/2024 (Originally 03/16/1968)   INFLUENZA VACCINE  08/20/2023   Medicare Annual Wellness (AWV)  05/25/2024   MAMMOGRAM  12/30/2024   DEXA SCAN  12/30/2024   Fecal DNA (Cologuard)  03/09/2025   Pneumonia Vaccine 26+ Years old  Completed   HPV VACCINES  Aged Out   Meningococcal B Vaccine  Aged Out   DTaP/Tdap/Td  Discontinued    Health Maintenance  Health Maintenance Due  Topic Date Due   Zoster Vaccines- Shingrix (1 of 2) 03/16/2000   COVID-19 Vaccine (5 - 2024-25 season) 09/20/2022   Health Maintenance Items Addressed: See Nurse Notes  Additional Screening:  Vision Screening: Recommended annual ophthalmology exams for early detection of glaucoma and other disorders of the eye.  Dental Screening: Recommended annual dental exams for proper oral hygiene  Community Resource Referral / Chronic Care Management: CRR required this visit?  No   CCM required this visit?  No     Plan:     I have personally reviewed and noted the following in the patient's chart:   Medical and social history Use of alcohol, tobacco or illicit drugs  Current medications and supplements including opioid prescriptions. Patient is not currently taking opioid prescriptions. Functional ability and status Nutritional status Physical activity Advanced directives List of other physicians Hospitalizations, surgeries, and ER visits in previous 12 months Vitals Screenings to include cognitive, depression, and falls Referrals and appointments  In addition, I have reviewed and discussed with patient  certain preventive protocols, quality metrics, and best practice recommendations. A written personalized care plan for preventive services as well as general preventive health recommendations were provided to patient.     Bruno Capri, LPN   4/0/9811   After Visit Summary: (MyChart) Due to this being a telephonic visit, the after visit summary with patients personalized plan was offered to patient via MyChart   Notes: Nothing significant to report at this time.

## 2023-09-16 ENCOUNTER — Other Ambulatory Visit (HOSPITAL_BASED_OUTPATIENT_CLINIC_OR_DEPARTMENT_OTHER): Payer: Self-pay | Admitting: Cardiovascular Disease

## 2023-09-16 DIAGNOSIS — E7800 Pure hypercholesterolemia, unspecified: Secondary | ICD-10-CM

## 2023-09-16 DIAGNOSIS — G20A1 Parkinson's disease without dyskinesia, without mention of fluctuations: Secondary | ICD-10-CM | POA: Diagnosis not present

## 2023-11-26 ENCOUNTER — Encounter: Payer: Self-pay | Admitting: Family Medicine

## 2023-11-26 ENCOUNTER — Ambulatory Visit (INDEPENDENT_AMBULATORY_CARE_PROVIDER_SITE_OTHER): Admitting: Family Medicine

## 2023-11-26 VITALS — BP 150/70 | HR 60 | Temp 98.1°F | Ht 64.0 in | Wt 144.2 lb

## 2023-11-26 DIAGNOSIS — G20A1 Parkinson's disease without dyskinesia, without mention of fluctuations: Secondary | ICD-10-CM | POA: Diagnosis not present

## 2023-11-26 DIAGNOSIS — R053 Chronic cough: Secondary | ICD-10-CM | POA: Diagnosis not present

## 2023-11-26 NOTE — Assessment & Plan Note (Signed)
 Symptoms are overall manageable.  She is following with neurology and may be enrolled in upcoming trial.

## 2023-11-26 NOTE — Patient Instructions (Signed)
 It was very nice to see you today!  VISIT SUMMARY: During your visit, we discussed your persistent cough and your ongoing participation in a clinical trial for a new Parkinson's drug. We reviewed potential causes and treatments for your cough and talked about the next steps for your Parkinson's care.  YOUR PLAN: CHRONIC COUGH: You have had a persistent cough for a couple of years, which may be due to silent reflux or postnasal drip. -Try over-the-counter famotidine (Pepcid) for a couple of weeks. -If the cough persists, try an allergy medication for a couple of weeks. -Continue the medication if your symptoms improve. -If there is no improvement, we may refer you to an ENT specialist or a pulmonologist.  PARKINSON'S DISEASE: You are participating in a clinical trial for a new Parkinson's drug aimed at slowing the progression of the disease. -Consider discussing the potential benefits of newer diabetes medications with your neurologist.  Return if symptoms worsen or fail to improve.   Take care, Dr Kennyth  PLEASE NOTE:  If you had any lab tests, please let us  know if you have not heard back within a few days. You may see your results on mychart before we have a chance to review them but we will give you a call once they are reviewed by us .   If we ordered any referrals today, please let us  know if you have not heard from their office within the next week.   If you had any urgent prescriptions sent in today, please check with the pharmacy within an hour of our visit to make sure the prescription was transmitted appropriately.   Please try these tips to maintain a healthy lifestyle:  Eat at least 3 REAL meals and 1-2 snacks per day.  Aim for no more than 5 hours between eating.  If you eat breakfast, please do so within one hour of getting up.   Each meal should contain half fruits/vegetables, one quarter protein, and one quarter carbs (no bigger than a computer mouse)  Cut down on  sweet beverages. This includes juice, soda, and sweet tea.   Drink at least 1 glass of water  with each meal and aim for at least 8 glasses per day  Exercise at least 150 minutes every week.

## 2023-11-26 NOTE — Progress Notes (Signed)
   BERNEITA SANAGUSTIN is a 73 y.o. female who presents today for an office visit.  Assessment/Plan:  Chronic Problems Addressed Today: No problem-specific Assessment & Plan notes found for this encounter.     Subjective:  HPI:  See assessment / plan for status of chronic conditions.    Discussed the use of AI scribe software for clinical note transcription with the patient, who gave verbal consent to proceed.  History of Present Illness Kyndal Heringer Stadel is a 73 year old female who presents with a persistent cough following COVID-19 infection.  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 The cough is not constant but is exacerbated by physical exertion, such as exercise, and is particularly noticeable when she exhales heavily, describing it as feeling like 'a cat' in her throat. She experiences this cough daily.  No postnasal drip, significant heartburn, or reflux symptoms, although she occasionally has mild heartburn. She has not tried any treatments for the cough, including over-the-counter medications, except for ibuprofen, which she takes occasionally. She sometimes feels hoarse and clears her throat frequently. There is a family history of reflux, as her mother had significant reflux issues.  She is currently in the process of being considered for a clinical trial for a new Parkinson's drug, which involves a nurse visiting her home soon. She has undergone genetic testing for Parkinson's and other conditions, which showed no significant findings.         Objective:  Physical Exam: BP (!) 150/70   Pulse 60   Temp 98.1 F (36.7 C)   Ht 5' 4 (1.626 m)   Wt 144 lb 3.2 oz (65.4 kg)   SpO2 100%   BMI 24.75 kg/m   Gen: No acute distress, resting comfortably CV: Regular rate and rhythm with no murmurs appreciated Pulm: Normal work of breathing, clear to auscultation bilaterally with no crackles, wheezes, or  rhonchi Neuro: Grossly normal, moves all extremities Psych: Normal affect and thought content      Luciano Cinquemani M. Kennyth, MD 11/26/2023 11:30 AM

## 2023-11-26 NOTE — Assessment & Plan Note (Signed)
 No red flags.  Reassuring lung exam.  She did have a CT scan last year which showed stable pulmonary nodule since 2019.  Differential includes reflux versus postnasal drip.  Did discuss trial of PPI and antihistamine however she would like to hold off on this for now as she is potentially going to be enrolled in an upcoming drug trial for Parkinson, though she is open to trial of these medications once she completes the parkinson trial. Given her recent CT scan do not think we need to repeat any imaging at this time.  Did discuss referral to ENT and pulmonology however she will try above medication trials first before we pursue this.

## 2023-12-22 DIAGNOSIS — H25813 Combined forms of age-related cataract, bilateral: Secondary | ICD-10-CM | POA: Diagnosis not present

## 2023-12-22 DIAGNOSIS — H40013 Open angle with borderline findings, low risk, bilateral: Secondary | ICD-10-CM | POA: Diagnosis not present

## 2023-12-22 DIAGNOSIS — H35363 Drusen (degenerative) of macula, bilateral: Secondary | ICD-10-CM | POA: Diagnosis not present

## 2023-12-23 DIAGNOSIS — E7800 Pure hypercholesterolemia, unspecified: Secondary | ICD-10-CM | POA: Diagnosis not present

## 2023-12-23 LAB — COMPREHENSIVE METABOLIC PANEL WITH GFR
ALT: 31 IU/L (ref 0–32)
AST: 25 IU/L (ref 0–40)
Albumin: 4.9 g/dL — ABNORMAL HIGH (ref 3.8–4.8)
Alkaline Phosphatase: 73 IU/L (ref 49–135)
BUN/Creatinine Ratio: 22 (ref 12–28)
BUN: 20 mg/dL (ref 8–27)
Bilirubin Total: 0.4 mg/dL (ref 0.0–1.2)
CO2: 22 mmol/L (ref 20–29)
Calcium: 9.6 mg/dL (ref 8.7–10.3)
Chloride: 104 mmol/L (ref 96–106)
Creatinine, Ser: 0.91 mg/dL (ref 0.57–1.00)
Globulin, Total: 2.4 g/dL (ref 1.5–4.5)
Glucose: 103 mg/dL — ABNORMAL HIGH (ref 70–99)
Potassium: 5 mmol/L (ref 3.5–5.2)
Sodium: 140 mmol/L (ref 134–144)
Total Protein: 7.3 g/dL (ref 6.0–8.5)
eGFR: 67 mL/min/1.73 (ref >59)

## 2023-12-23 LAB — LIPID PANEL
Chol/HDL Ratio: 3 ratio (ref 0.0–4.4)
Cholesterol, Total: 163 mg/dL (ref 100–199)
HDL: 55 mg/dL (ref >39)
LDL Chol Calc (NIH): 79 mg/dL (ref 0–99)
Triglycerides: 169 mg/dL — ABNORMAL HIGH (ref 0–149)
VLDL Cholesterol Cal: 29 mg/dL (ref 5–40)

## 2023-12-23 NOTE — Progress Notes (Signed)
 Cardiology Office Note   Date:  12/24/2023  ID:  Dawn Perry, DOB [birth year], MRN [id number] PCP: Kennyth Worth HERO, MD   [facility] Providers Cardiologist:  Annabella Scarce, MD     History of Present Illness Dawn Perry is a 73 y.o. female with history of nonobstructive CAD, hyperlipidemia, LBBB, and early Parkinson's disease.      She had a nuclear stress test in 2004 that showed LBBB and was negative for ischemia.  A coronary CT 11/2017 that revealed nonobstructive three-vessel CAD with negative FFR.  Her calcium  score was 357 which was 92nd percentile.  LP(a) is 21.1.  Her BP has fluctuated in the past and she has opted out of therapy.  She has had issues in the past with shortness of breath and chronic cough.  Cardiac involvement ruled out. She was last seen by Dr. Scarce 10/25/2022 and blood pressure was well-controlled.  She was doing well overall aside from her knee pain.     Dawn Perry presents today for 1 year follow-up for for CAD and hypertension.  She has recent Parkinson disease and is followed by [facility].  She has not started medication and plans to start therapy in February. She works out at [employer] to 3 times per week and does stay hydrated throughout the day. She has been reluctant to initiate BP medication in the past. She was being worked up for a clinical trial for Parkinson's and the nurse encouraged her to check her BP at home daily.  Her blood pressure at home runs 140s-150s SBP while sitting on the couch. She uses an arm BP cuff that has been compared with our cuff before and was accurate. She does not eat a lot of sweets and has been on the no-carb diet before in the past, but reports this is not sustainable. Her mother always had triglycerides as well.  ROS: All systems are negative unless indicated in the HPI.  Studies Reviewed     Cardiac Studies & Procedures    ______________________________________________________________________________________________          CT SCANS  CT CORONARY MORPH W/CTA COR W/SCORE 05/12/2022  Addendum 05/16/2022  4:47 PM ADDENDUM REPORT: 05/16/2022 16:44  EXAM: OVER-READ INTERPRETATION  CT CHEST  The following report is an over-read performed by radiologist Dr. Andrea Gasman of [facility] on 05/16/2022. This over-read does not include interpretation of cardiac or coronary anatomy or pathology. The coronary CTA interpretation by the cardiologist is attached.  COMPARISON:  11/29/2017  FINDINGS: Vascular: No aortic atherosclerosis. The included aorta is normal in caliber.  Mediastinum/nodes: No adenopathy or mass. Unremarkable esophagus.  Lungs: No focal airspace disease. Unchanged 3 mm left lower lobe nodule series 11, image 7, stable since 2019. Previous right lower lobe pulmonary nodule has diminished in size. No pleural fluid. The included airways are patent.  Upper abdomen: No acute or unexpected findings.  Musculoskeletal: There are no acute or suspicious osseous abnormalities. Thoracic spondylosis with anterior spurring.  IMPRESSION: Small pulmonary nodules are stable or decreased in size dating back to 2019 consistent with benign etiology. No further imaging follow-up is needed.   Electronically Signed By: Andrea Gasman M.D. On: 05/16/2022 16:44  Narrative CLINICAL DATA:  Chest pain  EXAM: Cardiac CTA  MEDICATIONS: Sub lingual nitro. 4mg  x 2  TECHNIQUE: The patient was scanned on a Siemens 192 slice scanner. Gantry rotation speed was 250 msecs. Collimation was 0.6 mm. A 100 kV prospective scan was triggered in the ascending  thoracic aorta at 35-75% of the R-R interval. Average HR during the scan was 60 bpm. The 3D data set was interpreted on a dedicated work station using MPR, MIP and VRT modes. A total of 80cc of contrast was  used.  FINDINGS: Non-cardiac: See separate report from [facility].  No LA appendage thrombus. Pulmonary veins drain normally to the left atrium.  Calcium  Score: 694 Agatston units.  Coronary Arteries: Right dominant with no anomalies  LM: No plaque or stenosis.  LAD system: Mixed plaque ostial LAD, mild (25-49%) stenosis. Calcified plaque mid LAD, mild (1-24%) stenosis.  Circumflex system: Calcified plaque proximal LCx, mild (1-24%) stenosis.  RCA system: Calcified plaque proximal RCA, mild (25-49%) stenosis. Noncalcified plaque mid RCA, mild (25-49%) stenosis.  IMPRESSION: 1. Coronary artery calcium  score 694 Agatston units. This places the patient in the 93rd percentile for age and gender.  2. Nonobstructive plaque in the coronaries. FFR analysis suggests no hemodynamically significant stenosis.  [employer]  Electronically Signed: By: Ezra Shuck M.D. On: 05/12/2022 21:28   CT SCANS  CT CORONARY FRACTIONAL FLOW RESERVE DATA PREP 11/29/2017  Narrative CLINICAL DATA:  CAD  EXAM: FFR CT  TECHNIQUE: The best systolic and diastolic phases from the patients gated cardiac CTA sent to [facility] for hemodynamic analysis  FINDINGS: Normal FFR CT in all 3 major epicardial coronary vessels  RCA: .97  LAD: .93  Circumflex .91  CONCLUSIONS Normal FFR CT  Maude Emmer   Electronically Signed By: Maude Emmer M.D. On: 12/02/2017 12:49   CT CORONARY MORPH W/CTA COR W/SCORE 11/29/2017  Addendum 11/29/2017  5:31 PM ADDENDUM REPORT: 11/29/2017 17:28  CLINICAL DATA:  Chest pain  EXAM: Cardiac CTA  MEDICATIONS: Sub lingual nitro. 4mg  and lopressor  5mg   TECHNIQUE: The patient was scanned on a [employer] 192 scanner. Gantry rotation speed was 250 msecs. Collimation was. 6 mm . A 120 kV prospective scan was triggered in the ascending thoracic aorta at 140 HU's with full mA between 30-70% of the R-R interval . Average HR during the  scan was 61 bpm. The 3D data set was interpreted on a dedicated work station using MPR, MIP and VRT modes. A total of 80cc of contrast was used.  FINDINGS: Non-cardiac: See separate report from [facility]. No significant findings on limited lung and soft tissue windows.  Calcium  score: Calcium  noted in all 3 major epicardial coronary vessels  Coronary Arteries: Right dominant with no anomalies  LM: Normal  LAD: 50% proximal calcific disease includes ostium of D1 less than 50% mid vessel disease  D1: Normal  D2: Normal  D3: Normal  Circumflex: Less than 50% calcific proximal and mid vessel disease  OM1: Normal  AV groove : Normal  RCA: Less than 50% proximal and mid vessel disease  PDA: Normal  PLA: Normal  IMPRESSION: 1. Likely non obstructive 3 vessel CAD. Proximal LAD seems worst will be sent for FFR CT  2.  Calcium  score 357 which is 92 nd percentile for age and sex  3.  Normal aortic root 2.8 cm  Maude Emmer   Electronically Signed By: Maude Emmer M.D. On: 11/29/2017 17:28  Narrative EXAM: OVER-READ INTERPRETATION  CT CHEST  The following report is an over-read performed by radiologist Dr. Franky Crease of [facility] on 11/29/2017. This over-read does not include interpretation of cardiac or coronary anatomy or pathology. The coronary CTA interpretation by the cardiologist is attached.  COMPARISON:  11/10/2017  FINDINGS: Vascular: Heart is borderline  in size. Visualized aorta is normal caliber.  Mediastinum/Nodes: No adenopathy in the lower mediastinum or hila.  Lungs/Pleura: Small scattered pulmonary nodules are again noted, 5 mm or less in size as described on prior study. No effusions.  Upper Abdomen: Imaging into the upper abdomen shows no acute findings.  Musculoskeletal: Chest wall soft tissues are unremarkable. No acute bony abnormality.  IMPRESSION: Small pulmonary nodules, 5 mm or less in size. No  follow-up needed if patient is low-risk (and has no known or suspected primary neoplasm). Non-contrast chest CT can be considered in 12 months if patient is high-risk. This recommendation follows the consensus statement: Guidelines for Management of Incidental Pulmonary Nodules Detected on CT Images: From the Fleischner Society 2017; Radiology 2017; 284:228-243.  Electronically Signed: By: Franky Crease M.D. On: 11/29/2017 16:37     ______________________________________________________________________________________________      Risk Assessment/Calculations           Physical Exam VS:  BP 132/86 (BP Location: Right Arm, Patient Position: Sitting, Cuff Size: Normal)   Pulse (!) 58   Ht 5' 2.5 (1.588 m)   Wt 143 lb (64.9 kg)   SpO2 97%   BMI 25.74 kg/m        Wt Readings from Last 3 Encounters:  12/24/23 143 lb (64.9 kg)  11/26/23 144 lb 3.2 oz (65.4 kg)  05/26/23 143 lb (64.9 kg)    GEN: Well nourished, well developed in no acute distress NECK: No JVD; No carotid bruits CARDIAC: RRR, no murmurs, rubs, gallops RESPIRATORY:  Clear to auscultation without rales, wheezing or rhonchi  ABDOMEN: Soft, non-tender, non-distended EXTREMITIES:  No edema; No deformity   ASSESSMENT AND PLAN  CAD- Coronary CT result LAD FFR of 0.83 on 05/13/22.  She denies chest pain, shortness of breath, lightheadedness, dizziness, syncope, and swelling.  She does report taking her aspirin  2-3 times per week and daily dose was encouraged.  Continue rosuvastatin  40 mg. Educated on heart healthy diet and exercise as tolerated.  HTN- She was recently encouraged by nurse to start checking her blood pressure daily.  She found her blood pressure at home was in the 140s-150s SBP. Start losartan  25 mg daily.  Monitor blood pressure once daily and schedule nurse visit in 2 weeks.  Repeat BMET in 2 weeks at nurse visit.   LBBB- EKG today showed normal sinus rhythm with LBBB.  No significant changes  found.  Hyperlipidemia LDL goal < 70- LDL 79 on 12/23/23. Continue rosuvastatin  40 mg.  Discussed adding Zetia to her regimen.  She wants to try lifestyle modifications for now.  Encouraged heart healthy diet and 150 minutes exercise per week.  Recheck lipid panel in 3 months before follow-up visit.       Dispo: Follow-up in 2 to 3 months with Dr. Raford or APP.  Signed, Mardy KATHEE Pizza, FNP

## 2023-12-24 ENCOUNTER — Ambulatory Visit (INDEPENDENT_AMBULATORY_CARE_PROVIDER_SITE_OTHER)

## 2023-12-24 ENCOUNTER — Encounter (HOSPITAL_BASED_OUTPATIENT_CLINIC_OR_DEPARTMENT_OTHER): Payer: Self-pay

## 2023-12-24 VITALS — BP 132/86 | HR 58 | Ht 62.5 in | Wt 143.0 lb

## 2023-12-24 DIAGNOSIS — E785 Hyperlipidemia, unspecified: Secondary | ICD-10-CM | POA: Diagnosis not present

## 2023-12-24 DIAGNOSIS — I10 Essential (primary) hypertension: Secondary | ICD-10-CM

## 2023-12-24 DIAGNOSIS — I2510 Atherosclerotic heart disease of native coronary artery without angina pectoris: Secondary | ICD-10-CM

## 2023-12-24 DIAGNOSIS — Z5181 Encounter for therapeutic drug level monitoring: Secondary | ICD-10-CM

## 2023-12-24 DIAGNOSIS — I447 Left bundle-branch block, unspecified: Secondary | ICD-10-CM

## 2023-12-24 DIAGNOSIS — I25118 Atherosclerotic heart disease of native coronary artery with other forms of angina pectoris: Secondary | ICD-10-CM | POA: Diagnosis not present

## 2023-12-24 DIAGNOSIS — E7800 Pure hypercholesterolemia, unspecified: Secondary | ICD-10-CM

## 2023-12-24 MED ORDER — ROSUVASTATIN CALCIUM 40 MG PO TABS: 40.0000 mg | ORAL_TABLET | Freq: Every day | ORAL | 1 refills | Status: AC

## 2023-12-24 MED ORDER — LOSARTAN POTASSIUM 25 MG PO TABS: 25.0000 mg | ORAL_TABLET | Freq: Every day | ORAL | 1 refills | Status: AC

## 2023-12-24 NOTE — Patient Instructions (Signed)
 Medication Instructions:   START taking Losartan  25 mg by mouth daily   Labwork:  1.)  In 2 weeks return for lab work here at [facility] on 3rd floor [address]--BMET--YOU DO NOT HAVE TO FAST AND YOU CAN GET THIS DONE SAME DAY AS YOUR NURSE VISIT BP CHECK  2.)  In 3 months (around 03/23/24 or around that time) for lab work at [facility] on 3rd floor [address]--LIPIDS--PLEASE COME FASTING TO THIS LAB APPOINTMENT    Follow-Up:  1.) IN 2 WEEKS FOR NURSE VISIT BLOOD PRESSURE CHECK --(PLEASE STOP BY LAB SAME DAY AS THIS APPOINTMENT)  2.)  IN 3 MONTHS WITH DR. [place] OR AN EXTENDER

## 2024-01-08 ENCOUNTER — Ambulatory Visit: Payer: Self-pay | Admitting: Cardiovascular Disease

## 2024-01-10 ENCOUNTER — Ambulatory Visit (HOSPITAL_BASED_OUTPATIENT_CLINIC_OR_DEPARTMENT_OTHER)

## 2024-01-11 ENCOUNTER — Ambulatory Visit (HOSPITAL_BASED_OUTPATIENT_CLINIC_OR_DEPARTMENT_OTHER)

## 2024-03-06 ENCOUNTER — Ambulatory Visit: Admitting: Speech Pathology

## 2024-04-10 ENCOUNTER — Ambulatory Visit (HOSPITAL_BASED_OUTPATIENT_CLINIC_OR_DEPARTMENT_OTHER): Admitting: Cardiovascular Disease

## 2024-04-11 ENCOUNTER — Ambulatory Visit (HOSPITAL_BASED_OUTPATIENT_CLINIC_OR_DEPARTMENT_OTHER): Admitting: Cardiovascular Disease

## 2024-06-05 ENCOUNTER — Ambulatory Visit
